# Patient Record
Sex: Male | Born: 1955 | Race: White | Hispanic: No | Smoking: Current every day smoker
Health system: Southern US, Community
[De-identification: ages and names within clinical notes are randomized; demographics above are authoritative.]

## PROBLEM LIST (undated history)

## (undated) DIAGNOSIS — J449 Chronic obstructive pulmonary disease, unspecified: Secondary | ICD-10-CM

## (undated) DIAGNOSIS — F419 Anxiety disorder, unspecified: Secondary | ICD-10-CM

## (undated) DIAGNOSIS — E782 Mixed hyperlipidemia: Secondary | ICD-10-CM

## (undated) DIAGNOSIS — I10 Essential (primary) hypertension: Secondary | ICD-10-CM

## (undated) DIAGNOSIS — E559 Vitamin D deficiency, unspecified: Secondary | ICD-10-CM

## (undated) DIAGNOSIS — K759 Inflammatory liver disease, unspecified: Secondary | ICD-10-CM

## (undated) DIAGNOSIS — C801 Malignant (primary) neoplasm, unspecified: Secondary | ICD-10-CM

## (undated) DIAGNOSIS — M1990 Unspecified osteoarthritis, unspecified site: Secondary | ICD-10-CM

## (undated) DIAGNOSIS — E785 Hyperlipidemia, unspecified: Secondary | ICD-10-CM

## (undated) DIAGNOSIS — F32A Depression, unspecified: Secondary | ICD-10-CM

## (undated) DIAGNOSIS — F329 Major depressive disorder, single episode, unspecified: Secondary | ICD-10-CM

## (undated) DIAGNOSIS — R7303 Prediabetes: Secondary | ICD-10-CM

## (undated) HISTORY — DX: Essential (primary) hypertension: I10

## (undated) HISTORY — DX: Prediabetes: R73.03

## (undated) HISTORY — DX: Vitamin D deficiency, unspecified: E55.9

## (undated) HISTORY — DX: Unspecified osteoarthritis, unspecified site: M19.90

## (undated) HISTORY — PX: HEMORRHOID SURGERY: SHX153

## (undated) HISTORY — DX: Inflammatory liver disease, unspecified: K75.9

## (undated) HISTORY — DX: Mixed hyperlipidemia: E78.2

## (undated) HISTORY — DX: Hyperlipidemia, unspecified: E78.5

---

## 1984-10-18 HISTORY — PX: ELBOW SURGERY: SHX618

## 1994-10-18 HISTORY — PX: HERNIA REPAIR: SHX51

## 2004-05-28 ENCOUNTER — Emergency Department (HOSPITAL_COMMUNITY): Admission: EM | Admit: 2004-05-28 | Discharge: 2004-05-29 | Payer: Self-pay | Admitting: Emergency Medicine

## 2005-10-18 HISTORY — PX: SHOULDER SURGERY: SHX246

## 2007-12-13 ENCOUNTER — Encounter: Admission: RE | Admit: 2007-12-13 | Discharge: 2007-12-13 | Payer: Self-pay | Admitting: Family Medicine

## 2009-04-07 ENCOUNTER — Ambulatory Visit (HOSPITAL_COMMUNITY): Admission: RE | Admit: 2009-04-07 | Discharge: 2009-04-07 | Payer: Self-pay | Admitting: Internal Medicine

## 2009-10-18 HISTORY — PX: POLYPECTOMY: SHX149

## 2009-10-18 HISTORY — PX: COLONOSCOPY: SHX174

## 2010-04-23 ENCOUNTER — Encounter (INDEPENDENT_AMBULATORY_CARE_PROVIDER_SITE_OTHER): Payer: Self-pay

## 2010-04-28 ENCOUNTER — Ambulatory Visit: Payer: Self-pay | Admitting: Gastroenterology

## 2010-06-03 ENCOUNTER — Ambulatory Visit: Payer: Self-pay | Admitting: Gastroenterology

## 2010-06-03 LAB — HM COLONOSCOPY

## 2010-06-05 ENCOUNTER — Encounter: Payer: Self-pay | Admitting: Gastroenterology

## 2010-11-18 NOTE — Letter (Signed)
Summary: Patient Notice- Polyp Results  [facility]  [address] [place], [place] [address]   Phone: [phone]  Fax: [phone]        June 05, 2010 MRN: [id number]    [facility] [address] [place], [place]  [address]    Dear Edward Cuevas,  I am pleased to inform you that the colon polyp(s) removed during your recent colonoscopy was (were) found to be benign (no cancer detected) upon pathologic examination.  I recommend you have a repeat colonoscopy examination in 10_ years to look for recurrent polyps, as having colon polyps increases your risk for having recurrent polyps or even colon cancer in the future.  Should you develop new or worsening symptoms of abdominal pain, bowel habit changes or bleeding from the rectum or bowels, please schedule an evaluation with either your primary care physician or with me.  Additional information/recommendations:  _x_ No further action with gastroenterology is needed at this time. Please      follow-up with your primary care physician for your other healthcare      needs.  __ Please call [phone] to schedule a return visit to review your      situation.  __ Please keep your follow-up visit as already scheduled.  __ Continue treatment plan as outlined the day of your exam.  Please call us if you are having persistent problems or have questions about your condition that have not been fully answered at this time.  Sincerely,  Mardella Layman MD [facility]  This letter has been electronically signed by your physician.  Appended Document: Patient Notice- Polyp Results letter mailed

## 2010-11-18 NOTE — Letter (Signed)
Summary: [facility] Instructions  [facility]  [address] [place], [place] [address]   Phone: [phone]  Fax: [phone]       Edward Cuevas    1956/06/30    MRN: [id number]        Procedure Day Dorna Bloom:  [facility]  06/03/10    Arrival Time: 10:00am     Procedure Time: 11:00am     Location of Procedure:                    _x _   Endoscopy Center (4th Floor)                        PREPARATION FOR COLONOSCOPY WITH MOVIPREP   Starting 5 days prior to your procedure FRIDAY 08/12  do not eat nuts, seeds, popcorn, corn, beans, peas,  salads, or any raw vegetables.  Do not take any fiber supplements (e.g. Metamucil, Citrucel, and Benefiber).  THE DAY BEFORE YOUR PROCEDURE         DATE: TUESDAY  08/16  1.  Drink clear liquids the entire day-NO SOLID FOOD  2.  Do not drink anything colored red or purple.  Avoid juices with pulp.  No orange juice.  3.  Drink at least 64 oz. (8 glasses) of fluid/clear liquids during the day to prevent dehydration and help the prep work efficiently.  CLEAR LIQUIDS INCLUDE: Water Jello Ice Popsicles Tea (sugar ok, no milk/cream) Powdered fruit flavored drinks Coffee (sugar ok, no milk/cream) Gatorade Juice: apple, white grape, white cranberry  Lemonade Clear bullion, consomm, broth Carbonated beverages (any kind) Strained chicken noodle soup Hard Candy                             4.  In the morning, mix first dose of MoviPrep solution:    Empty 1 Pouch A and 1 Pouch B into the disposable container    Add lukewarm drinking water to the top line of the container. Mix to dissolve    Refrigerate (mixed solution should be used within 24 hrs)  5.  Begin drinking the prep at 5:00 p.m. The MoviPrep container is divided by 4 marks.   Every 15 minutes drink the solution down to the next mark (approximately 8 oz) until the full liter is complete.   6.  Follow completed prep with 16 oz of clear liquid of your choice (Nothing  red or purple).  Continue to drink clear liquids until bedtime.  7.  Before going to bed, mix second dose of MoviPrep solution:    Empty 1 Pouch A and 1 Pouch B into the disposable container    Add lukewarm drinking water to the top line of the container. Mix to dissolve    Refrigerate  THE DAY OF YOUR PROCEDURE      DATE: [facility]  08/17  Beginning at 6:00 a.m. (5 hours before procedure):         1. Every 15 minutes, drink the solution down to the next mark (approx 8 oz) until the full liter is complete.  2. Follow completed prep with 16 oz. of clear liquid of your choice.    3. You may drink clear liquids until 9:00am  (2 HOURS BEFORE PROCEDURE).   MEDICATION INSTRUCTIONS  Unless otherwise instructed, you should take regular prescription medications with a small sip of water   as early as possible the morning of  your procedure.         OTHER INSTRUCTIONS  You will need a responsible adult at least 56 years of age to accompany you and drive you home.   This person must remain in the waiting room during your procedure.  Wear loose fitting clothing that is easily removed.  Leave jewelry and other valuables at home.  However, you may wish to bring a book to read or  an iPod/MP3 player to listen to music as you wait for your procedure to start.  Remove all body piercing jewelry and leave at home.  Total time from sign-in until discharge is approximately 2-3 hours.  You should go home directly after your procedure and rest.  You can resume normal activities the  day after your procedure.  The day of your procedure you should not:   Drive   Make legal decisions   Operate machinery   Drink alcohol   Return to work  You will receive specific instructions about eating, activities and medications before you leave.    The above instructions have been reviewed and explained to me by   Ulis Rias RN  April 28, 2010 4:23 PM     I fully understand and can  verbalize these instructions _____________________________ Date _________

## 2010-11-18 NOTE — Letter (Signed)
Summary: Patient Notice- Polyp ResultsPLEASE DELETE  [facility]  [address] [place], [place] [address]   Phone: [phone]  Fax: [phone]        June 05, 2010 MRN: [id number]    [facility] [address] [place], [place]  [address]    Dear Mr. Harig,  I am pleased to inform you that the colon polyp(s) removed during your recent colonoscopy was (were) found to be benign (no cancer detected) upon pathologic examination.  I recommend you have a repeat colonoscopy examination in 10_ years to look for recurrent polyps, as having colon polyps increases your risk for having recurrent polyps or even colon cancer in the future.  Should you develop new or worsening symptoms of abdominal pain, bowel habit changes or bleeding from the rectum or bowels, please schedule an evaluation with either your primary care physician or with me.  Additional information/recommendations:  _X_ No further action with gastroenterology is needed at this time. Please      follow-up with your primary care physician for your other healthcare      needs.  __ Please call [phone] to schedule a return visit to review your      situation.  __ Please keep your follow-up visit as already scheduled.  __ Continue treatment plan as outlined the day of your exam.  Please call us if you are having persistent problems or have questions about your condition that have not been fully answered at this time.  Sincerely,  Mardella Layman MD [facility]  This letter has been electronically signed by your physician.

## 2010-11-18 NOTE — Letter (Signed)
Summary: Patient Notice- Polyp ResultsPLEASE DELETE  [facility]  [address] [place], [place] [address]   Phone: [phone]  Fax: [phone]        June 05, 2010 MRN: [id number]    [facility] [address] [place], [place]  [address]    Dear Mr. Breon,  I am pleased to inform you that the colon polyp(s) removed during your recent colonoscopy was (were) found to be benign (no cancer detected) upon pathologic examination.  I recommend you have a repeat colonoscopy examination in 10_ years to look for recurrent polyps, as having colon polyps increases your risk for having recurrent polyps or even colon cancer in the future.  Should you develop new or worsening symptoms of abdominal pain, bowel habit changes or bleeding from the rectum or bowels, please schedule an evaluation with either your primary care physician or with me.  Additional information/recommendations:  __ No further action with gastroenterology is needed at this time. Please      follow-up with your primary care physician for your other healthcare      needs.  __ Please call [phone] to schedule a return visit to review your      situation.  __ Please keep your follow-up visit as already scheduled.  _x_ Continue treatment plan as outlined the day of your exam.  Please call us if you are having persistent problems or have questions about your condition that have not been fully answered at this time.  Sincerely,  Mardella Layman MD [facility]  This letter has been electronically signed by your physician.

## 2010-11-18 NOTE — Miscellaneous (Signed)
Summary: Lec previsit  Clinical Lists Changes  Medications: Added new medication of MOVIPREP 100 GM  SOLR (PEG-KCL-NACL-NASULF-NA ASC-C) As per prep instructions. - Signed Rx of MOVIPREP 100 GM  SOLR (PEG-KCL-NACL-NASULF-NA ASC-C) As per prep instructions.;  #1 x 0;  Signed;  Entered by: Ulis Rias RN;  Authorized by: Mardella Layman MD [facility];  Method used: [facility] [phone]*, [address] [place], [place], [place]  [address], Ph: [phone] or [phone], Fax: [phone] Allergies: Added new allergy or adverse reaction of PENICILLIN Observations: Added new observation of NKA: F (04/28/2010 15:32)    Prescriptions: MOVIPREP 100 GM  SOLR (PEG-KCL-NACL-NASULF-NA ASC-C) As per prep instructions.  #1 x 0   Entered by:   Ulis Rias RN   Authorized by:   Mardella Layman MD [facility]   Signed by:   Ulis Rias RN on 04/28/2010   Method used:   [facility] to        [facility] [phone]* (retail)       [address]       [place], [place]  [address]       Ph: [phone] or [phone]       Fax: [phone]   RxID:   [id number]

## 2010-11-18 NOTE — Procedures (Signed)
Summary: Colonoscopy  Patient: Edward Cuevas Note: All result statuses are Final unless otherwise noted.  Tests: (1) Colonoscopy (COL)   COL Colonoscopy           DONE     [facility]     [address]     [place], [place]  [address]           COLONOSCOPY PROCEDURE REPORT           PATIENT:  Edward Cuevas, Edward Cuevas  MR#:  [id number]     BIRTHDATE:  [birth year], 54 yrs. old  GENDER:  male     ENDOSCOPIST:  Vania Rea. Jarold Motto, MD, [facility]     REF. BY:  Lucky Cowboy, M.D.     PROCEDURE DATE:  06/03/2010     PROCEDURE:  Colonoscopy with snare polypectomy     ASA CLASS:  Class II     INDICATIONS:  Routine Risk Screening     MEDICATIONS:   Fentanyl 75 mcg IV, Benadryl 12.5 mg IV, Versed 7     mg IV           DESCRIPTION OF PROCEDURE:   After the risks benefits and     alternatives of the procedure were thoroughly explained, informed     consent was obtained.  Digital rectal exam was performed and     revealed no abnormalities.   The LB CF-[id number] endoscope     was introduced through the anus and advanced to the cecum, which     was identified by both the appendix and ileocecal valve, without     limitations.  The quality of the prep was adequate, using     MoviPrep.  The instrument was then slowly withdrawn as the colon     was fully examined.     <<PROCEDUREIMAGES>>           FINDINGS:  There were multiple polyps identified and removed. in     the rectum and sigmoid colon. FLAT 2-4MM POLYPS COLD SNARE     EXCISED.SEE PICTURES.  This was otherwise a normal examination of     the colon.  Internal hemorrhoids were found.   Retroflexed views     in the rectum revealed hypertrophied anal papillae.    The scope     was then withdrawn from the patient and the procedure completed.           COMPLICATIONS:  None     ENDOSCOPIC IMPRESSION:     1) Polyps, multiple in the rectum and sigmoid colon     2) Otherwise normal examination     3) Internal hemorrhoids     4) Hypertrophied anal  papillae     R/O ADENOMAS     RECOMMENDATIONS:     1) Repeat colonoscopy in 5 years if polyp adenomatous; otherwise     10 years     REPEAT EXAM:  No           ______________________________     Vania Rea. Jarold Motto, MD, [facility]           CC:           n.     eSIGNED:   Vania Rea. Patterson at 06/03/2010 12:10 PM           Jolinda Croak, [id number]  Note: An exclamation mark (!) indicates a result that was not dispersed into the flowsheet. Document Creation Date: 06/03/2010 12:12 PM _______________________________________________________________________  (1) Order result status: Final Collection  or observation date-time: 06/03/2010 12:04 Requested date-time:  Receipt date-time:  Reported date-time:  Referring Physician:   Ordering Physician: Sheryn Bison [phone]) Specimen Source:  Source: Launa Grill Order Number: [id number] Lab site:   Appended Document: Colonoscopy 10y f/u  Appended Document: Colonoscopy     Procedures Next Due Date:    Colonoscopy: 05/2020

## 2010-12-17 ENCOUNTER — Ambulatory Visit: Payer: Self-pay | Admitting: Gastroenterology

## 2011-07-09 ENCOUNTER — Other Ambulatory Visit: Payer: Self-pay | Admitting: Internal Medicine

## 2011-09-22 ENCOUNTER — Ambulatory Visit (HOSPITAL_COMMUNITY)
Admission: RE | Admit: 2011-09-22 | Discharge: 2011-09-22 | Disposition: A | Discharge status: Home / Self Care | Payer: BC Managed Care – PPO | Source: Ambulatory Visit | Attending: Internal Medicine | Admitting: Internal Medicine

## 2011-09-22 ENCOUNTER — Other Ambulatory Visit (HOSPITAL_COMMUNITY): Payer: Self-pay | Admitting: Internal Medicine

## 2011-09-22 DIAGNOSIS — F17200 Nicotine dependence, unspecified, uncomplicated: Secondary | ICD-10-CM | POA: Insufficient documentation

## 2011-09-22 DIAGNOSIS — I10 Essential (primary) hypertension: Secondary | ICD-10-CM | POA: Insufficient documentation

## 2011-09-22 DIAGNOSIS — Z0000 Encounter for general adult medical examination without abnormal findings: Secondary | ICD-10-CM | POA: Insufficient documentation

## 2013-09-03 ENCOUNTER — Encounter: Payer: Self-pay | Admitting: Internal Medicine

## 2013-09-03 NOTE — Telephone Encounter (Signed)
This encounter was created in error - please disregard.

## 2013-09-04 ENCOUNTER — Telehealth: Payer: Self-pay | Admitting: Internal Medicine

## 2013-09-04 NOTE — Telephone Encounter (Signed)
Can refill tyl # 4 an if needs hydrocodone can refer to pain clinic

## 2013-09-04 NOTE — Telephone Encounter (Signed)
Spoke with pt.  Per Dr. Oneta Rack, can not give RX for Hydrocodone but can refer to pain clinic . Pt can get refill for Tylenol #4 when due. Pt has no insurance and declined pain clinic appt

## 2013-09-04 NOTE — Telephone Encounter (Signed)
PT SAID HE LOST HIS JOB AND CAN NOT AFFORD THE TYLENOL 4.  WOULD LIKE TO REQUEST HYDROCODONE BECAUSE IT IS CHEAPER. PLEASE ADVISE PT. SENDING CHART AND NOTE BACK TO YOU

## 2013-09-19 ENCOUNTER — Other Ambulatory Visit: Payer: Self-pay | Admitting: Internal Medicine

## 2013-10-22 ENCOUNTER — Other Ambulatory Visit: Payer: Self-pay | Admitting: Internal Medicine

## 2013-10-22 DIAGNOSIS — E785 Hyperlipidemia, unspecified: Secondary | ICD-10-CM

## 2013-10-22 DIAGNOSIS — R7303 Prediabetes: Secondary | ICD-10-CM | POA: Insufficient documentation

## 2013-10-22 DIAGNOSIS — I10 Essential (primary) hypertension: Secondary | ICD-10-CM | POA: Insufficient documentation

## 2013-10-22 DIAGNOSIS — E782 Mixed hyperlipidemia: Secondary | ICD-10-CM | POA: Insufficient documentation

## 2013-10-22 DIAGNOSIS — E559 Vitamin D deficiency, unspecified: Secondary | ICD-10-CM | POA: Insufficient documentation

## 2013-10-22 MED ORDER — ACETAMINOPHEN-CODEINE #4 300-60 MG PO TABS: ORAL_TABLET | ORAL | Status: DC

## 2013-10-23 ENCOUNTER — Ambulatory Visit (INDEPENDENT_AMBULATORY_CARE_PROVIDER_SITE_OTHER): Payer: Self-pay | Admitting: Internal Medicine

## 2013-10-23 ENCOUNTER — Ambulatory Visit: Payer: Self-pay | Admitting: Internal Medicine

## 2013-10-23 ENCOUNTER — Encounter: Payer: Self-pay | Admitting: Internal Medicine

## 2013-10-23 VITALS — BP 102/60 | HR 68 | Temp 97.9°F | Resp 16 | Wt 179.0 lb

## 2013-10-23 DIAGNOSIS — R7309 Other abnormal glucose: Secondary | ICD-10-CM

## 2013-10-23 DIAGNOSIS — M51379 Other intervertebral disc degeneration, lumbosacral region without mention of lumbar back pain or lower extremity pain: Secondary | ICD-10-CM

## 2013-10-23 DIAGNOSIS — M159 Polyosteoarthritis, unspecified: Secondary | ICD-10-CM | POA: Insufficient documentation

## 2013-10-23 DIAGNOSIS — IMO0002 Reserved for concepts with insufficient information to code with codable children: Secondary | ICD-10-CM

## 2013-10-23 DIAGNOSIS — M5137 Other intervertebral disc degeneration, lumbosacral region: Secondary | ICD-10-CM | POA: Insufficient documentation

## 2013-10-23 DIAGNOSIS — E782 Mixed hyperlipidemia: Secondary | ICD-10-CM

## 2013-10-23 DIAGNOSIS — Z79899 Other long term (current) drug therapy: Secondary | ICD-10-CM

## 2013-10-23 DIAGNOSIS — E559 Vitamin D deficiency, unspecified: Secondary | ICD-10-CM

## 2013-10-23 HISTORY — DX: Other intervertebral disc degeneration, lumbosacral region: M51.37

## 2013-10-23 HISTORY — DX: Polyosteoarthritis, unspecified: M15.9

## 2013-10-23 HISTORY — DX: Other intervertebral disc degeneration, lumbosacral region without mention of lumbar back pain or lower extremity pain: M51.379

## 2013-10-23 NOTE — Progress Notes (Signed)
Patient ID: Edward Cuevas, male   DOB: 08-02-1956, 58 y.o.   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    HTN predates since 2003. Patient has lost weight, been out of work since last March and out of BP meds for 3-4 months Today's BP: 102/60 mmHg. Patient denies any cardiac type chest pain, palpitations, dyspnea/orthopnea/PND, dizziness, claudication, or dependent edema.   Patient is moderately limited by pains in his hands, shoulders, knees neck and back and was laid off from work last March and applied for disability and was approved for it to begin in March of this year. He does take Tyl # 3 up to 3-4 x day to maintain his activities.   Hyperlipidemia is controlled with diet & meds. Last Cholesterol was 158, Triglycerides were 49, HDL 64 and LDL 84 in March 2014 - at goal (while on treatment). Patient denies myalgias or other med SE's.    Also, the patient has history of PreDiabetes/insulin resistance since 03/2009 with last A1c of5.1% and elevated insulin 40 in July 2014. Patient denies any symptoms of reactive hypoglycemia, diabetic polys, paresthesias or visual blurring.      Further, Patient has history of Vitamin D Deficiency  (37 in 2010) with last vitamin D of 42 in July 2014. Patient supplements vitamin D without any suspected side-effects.  Current Outpatient Prescriptions on File Prior to Visit  Medication Sig Dispense Refill  . acetaminophen-codeine (TYLENOL #4) 300-60 MG per tablet 1 tablet 4 x day if needed for severe pain  120 tablet  3  . Cholecalciferol (VITAMIN D PO) Take by mouth daily. Takes 5000 units daily      . citalopram (CELEXA) 40 MG tablet Take 40 mg by mouth daily.      . clonazePAM (KLONOPIN) 2 MG tablet Take 2 mg by mouth. 1/2 TO 1 TABLET THREE TIMES DAILY       No current facility-administered medications on file prior to visit.     Allergies   Allergen Reactions  . Penicillins     REACTION: rash, itching  . Viagra [Sildenafil Citrate]     PMHx:   Past Medical History  Diagnosis Date  . Hyperlipidemia   . Hypertension   . Prediabetes   . Vitamin D deficiency   . Arthritis   . Hepatitis     A,B,C    FHx:    Reviewed / unchanged  SHx:    Reviewed / unchanged  Systems Review: Constitutional: Denies fever, chills, wt changes, headaches, insomnia, fatigue, night sweats, change in appetite. Eyes: Denies redness, blurred vision, diplopia, discharge, itchy, watery eyes.  ENT: Denies discharge, congestion, post nasal drip, epistaxis, sore throat, earache, hearing loss, dental pain, tinnitus, vertigo, sinus pain, snoring.  CV: Denies chest pain, palpitations, irregular heartbeat, syncope, dyspnea, diaphoresis, orthopnea, PND, claudication, edema. Respiratory: denies cough, dyspnea, DOE, pleurisy, hoarseness, laryngitis, wheezing.  Gastrointestinal: Denies dysphagia, odynophagia, heartburn, reflux, water brash, abdominal pain or cramps, nausea, vomiting, bloating, diarrhea, constipation, hematemesis, melena, hematochezia,  or hemorrhoids. Genitourinary: Denies dysuria, frequency, urgency, nocturia, hesitancy, discharge, hematuria, flank pain. Musculoskeletal: Denies arthralgias, myalgias, stiffness, jt. swelling, pain, limp, strain/sprain.  Skin: Denies pruritus, rash, hives, warts, acne, eczema, change in skin lesion(s). Neuro: No weakness, tremor, incoordination, spasms, paresthesia, or pain. Psychiatric: Denies confusion, memory loss, or sensory loss. Endo: Denies change in weight, skin, hair change.  Heme/Lymph: No excessive bleeding, bruising, orenlarged lymph  nodes.  Filed Vitals:   10/23/13 1448  BP: 102/60  Pulse: 68  Temp: 97.9 F (36.6 C)  Resp: 16    There is no height on file to calculate BMI.  On Exam: Appears well nourished - in no distress. Eyes: PERRLA, EOMs, conjunctiva no swelling or  erythema. Sinuses: No frontal/maxillary tenderness ENT/Mouth: EAC's clear, TM's nl w/o erythema, bulging. Nares clear w/o erythema, swelling, exudates. Oropharynx clear without erythema or exudates. Oral hygiene is good. Tongue normal, non obstructing. Hearing intact.  Neck: Supple. Thyroid nl. Car 2+/2+ without bruits, nodes or JVD. Chest: Respirations nl with BS clear & equal w/o rales, rhonchi, wheezing or stridor.  Cor: Heart sounds normal w/ regular rate and rhythm without sig. murmurs, gallops, clicks, or rubs. Peripheral pulses normal and equal  without edema.  Abdomen: Soft & bowel sounds normal. Non-tender w/o guarding, rebound, hernias, masses, or organomegaly.  Lymphatics: Unremarkable.  Musculoskeletal: Full ROM all peripheral extremities, joint stability, 5/5 strength, and normal gait.  Skin: Warm, dry without exposed rashes, lesions, ecchymosis apparent.  Neuro: Cranial nerves intact, reflexes equal bilaterally. Sensory-motor testing grossly intact. Tendon reflexes grossly intact.  Pysch: Alert & oriented x 3. Insight and judgement nl & appropriate. No ideations.  Assessment and Plan:  1. Hypertension - Continue monitor blood pressure at home. Continue diet/meds same.  2. Hyperlipidemia - Continue diet/meds, exercise,& lifestyle modifications.   3. Pre-diabetes/Insulin Resistance - Continue diet, exercise, lifestyle modifications. Monitor appropriate labs when pt's new insurance takes effect  4. Vitamin D Deficiency - Continue supplementation.  5. DJD / DDD   Recommended regular exercise, BP monitoring, weight control, and discussed med and SE's. Recommended labs to assess and monitor clinical status. Further disposition pending results of labs.

## 2013-10-24 ENCOUNTER — Ambulatory Visit: Payer: Self-pay | Admitting: Internal Medicine

## 2013-11-28 ENCOUNTER — Other Ambulatory Visit: Payer: Self-pay | Admitting: Internal Medicine

## 2014-01-04 ENCOUNTER — Other Ambulatory Visit: Payer: Self-pay | Admitting: Internal Medicine

## 2014-01-05 ENCOUNTER — Other Ambulatory Visit: Payer: Self-pay | Admitting: Internal Medicine

## 2014-01-22 ENCOUNTER — Other Ambulatory Visit: Payer: Self-pay | Admitting: Internal Medicine

## 2014-01-22 MED ORDER — CITALOPRAM HYDROBROMIDE 40 MG PO TABS: 40.0000 mg | ORAL_TABLET | Freq: Every day | ORAL | Status: DC

## 2014-01-23 ENCOUNTER — Other Ambulatory Visit: Payer: Self-pay | Admitting: Internal Medicine

## 2014-01-24 ENCOUNTER — Other Ambulatory Visit: Payer: Self-pay | Admitting: Emergency Medicine

## 2014-01-24 MED ORDER — ACETAMINOPHEN-CODEINE #4 300-60 MG PO TABS: ORAL_TABLET | ORAL | Status: DC

## 2014-01-25 ENCOUNTER — Other Ambulatory Visit: Payer: Self-pay | Admitting: Emergency Medicine

## 2014-01-25 MED ORDER — CITALOPRAM HYDROBROMIDE 40 MG PO TABS: 40.0000 mg | ORAL_TABLET | Freq: Every day | ORAL | Status: DC

## 2014-02-03 ENCOUNTER — Other Ambulatory Visit: Payer: Self-pay | Admitting: Internal Medicine

## 2014-03-05 ENCOUNTER — Other Ambulatory Visit: Payer: Self-pay | Admitting: Emergency Medicine

## 2014-03-08 ENCOUNTER — Other Ambulatory Visit: Payer: Self-pay | Admitting: Internal Medicine

## 2014-04-08 ENCOUNTER — Ambulatory Visit (INDEPENDENT_AMBULATORY_CARE_PROVIDER_SITE_OTHER): Payer: 59 | Admitting: Internal Medicine

## 2014-04-08 ENCOUNTER — Encounter: Payer: Self-pay | Admitting: Internal Medicine

## 2014-04-08 VITALS — BP 142/84 | HR 64 | Temp 98.6°F | Resp 16 | Ht 71.0 in | Wt 176.6 lb

## 2014-04-08 DIAGNOSIS — R7303 Prediabetes: Secondary | ICD-10-CM

## 2014-04-08 DIAGNOSIS — F3289 Other specified depressive episodes: Secondary | ICD-10-CM

## 2014-04-08 DIAGNOSIS — G894 Chronic pain syndrome: Secondary | ICD-10-CM

## 2014-04-08 DIAGNOSIS — E785 Hyperlipidemia, unspecified: Secondary | ICD-10-CM

## 2014-04-08 DIAGNOSIS — E559 Vitamin D deficiency, unspecified: Secondary | ICD-10-CM

## 2014-04-08 DIAGNOSIS — F32A Depression, unspecified: Secondary | ICD-10-CM

## 2014-04-08 DIAGNOSIS — Z79899 Other long term (current) drug therapy: Secondary | ICD-10-CM | POA: Insufficient documentation

## 2014-04-08 DIAGNOSIS — F329 Major depressive disorder, single episode, unspecified: Secondary | ICD-10-CM

## 2014-04-08 DIAGNOSIS — I10 Essential (primary) hypertension: Secondary | ICD-10-CM

## 2014-04-08 DIAGNOSIS — R7309 Other abnormal glucose: Secondary | ICD-10-CM

## 2014-04-08 LAB — CBC WITH DIFFERENTIAL/PLATELET
BASOS ABS: 0.1 10*3/uL (ref 0.0–0.1)
BASOS PCT: 1 % (ref 0–1)
Eosinophils Absolute: 0 10*3/uL (ref 0.0–0.7)
Eosinophils Relative: 0 % (ref 0–5)
HEMATOCRIT: 45.5 % (ref 39.0–52.0)
Hemoglobin: 16.3 g/dL (ref 13.0–17.0)
Lymphocytes Relative: 30 % (ref 12–46)
Lymphs Abs: 1.9 10*3/uL (ref 0.7–4.0)
MCH: 32.8 pg (ref 26.0–34.0)
MCHC: 35.8 g/dL (ref 30.0–36.0)
MCV: 91.5 fL (ref 78.0–100.0)
MONO ABS: 0.4 10*3/uL (ref 0.1–1.0)
Monocytes Relative: 7 % (ref 3–12)
NEUTROS ABS: 4 10*3/uL (ref 1.7–7.7)
Neutrophils Relative %: 62 % (ref 43–77)
PLATELETS: 194 10*3/uL (ref 150–400)
RBC: 4.97 MIL/uL (ref 4.22–5.81)
RDW: 14.5 % (ref 11.5–15.5)
WBC: 6.4 10*3/uL (ref 4.0–10.5)

## 2014-04-08 LAB — HEMOGLOBIN A1C
Hgb A1c MFr Bld: 5.7 % — ABNORMAL HIGH (ref <5.7)
MEAN PLASMA GLUCOSE: 117 mg/dL — AB (ref <117)

## 2014-04-08 MED ORDER — BUPROPION HCL ER (XL) 300 MG PO TB24: 300.0000 mg | ORAL_TABLET | Freq: Every day | ORAL | Status: DC

## 2014-04-08 MED ORDER — ACETAMINOPHEN-CODEINE #4 300-60 MG PO TABS: ORAL_TABLET | ORAL | Status: DC

## 2014-04-08 NOTE — Progress Notes (Signed)
Patient ID: Edward Cuevas, male   DOB: 1956-07-28, 58 y.o.   MRN: [id number]    This very nice 58 y.o.DWM presents for  delayed 3 month follow up (2 months late)  with Hypertension, Hyperlipidemia, Pre-Diabetes and Vitamin D Deficiency.    HTN predates since 2003. BP has been controlled at home. Today's BP: 142/84 mmHg. Patient denies any cardiac type chest pain, palpitations, dyspnea/orthopnea/PND, dizziness, claudication, or dependent edema.   Hyperlipidemia is controlled with diet. Last Cholesterol was 158, Triglycerides were 49, HDL 68 and LDL 84 - at goal in July 2014. Patient denies myalgias or other med SE's.    Patient continues to c/o pains in all extremity joints limiting his ability to function and in fact was awarded SS disability. He relates having become depressed and reclusive - not leaving his home for long periods. He denies any suicidal ideations.   Also, the patient has history of PreDiabetes/insulin resistance since 2011 with A1c 5.9% and last A1c was 5.1% with elevated insulin in Aug 2014. Patient denies any symptoms of reactive hypoglycemia, diabetic polys, paresthesias or visual blurring.   Further, Patient has history of Vitamin D Deficiency with last vitamin D of   . Patient supplements vitamin D without any suspected side-effects.   Medication List   acetaminophen-codeine 300-60 MG per tablet  Commonly known as:  TYLENOL #4  Take 1/2 to 1 tablet 3 to 4 x day if needed for pain     buPROPion 300 MG 24 hr tablet  Commonly known as:  WELLBUTRIN XL Start taking on:  06/08/2014  Take 1 tablet (300 mg total) by mouth daily. For depression     citalopram 40 MG tablet  Commonly known as:  CELEXA  TAKE 1 TABLET EVERY DAY     clonazePAM 2 MG tablet  Commonly known as:  KLONOPIN  TAKE 1/2 TO 1 TAB 3 TIMES DAILY     traMADol 50 MG tablet  Commonly known as:  ULTRAM  TAKE 1 TABLET BY MOUTH 3-4 TIMES A DAY AS NEEDED FOR PAIN     VITAMIN D PO  Take by mouth daily. Takes 5000  units daily       Allergies  Allergen Reactions  . Penicillins     REACTION: rash, itching  . Viagra [Sildenafil Citrate]    PMHx:   Past Medical History  Diagnosis Date  . Hyperlipidemia   . Hypertension   . Prediabetes   . Vitamin D deficiency   . Arthritis   . Hepatitis     A,B,C   FHx:    Reviewed / unchanged  SHx:    Reviewed / unchanged  Patient is on SS Disability for his DJD.  Systems Review: Constitutional: Denies fever, chills, wt changes, headaches, insomnia, fatigue, night sweats, change in appetite. Eyes: Denies redness, blurred vision, diplopia, discharge, itchy, watery eyes.  ENT: Denies discharge, congestion, post nasal drip, epistaxis, sore throat, earache, hearing loss, dental pain, tinnitus, vertigo, sinus pain, snoring.  CV: Denies chest pain, palpitations, irregular heartbeat, syncope, dyspnea, diaphoresis, orthopnea, PND, claudication or edema. Respiratory: denies cough, dyspnea, DOE, pleurisy, hoarseness, laryngitis, wheezing.  Gastrointestinal: Denies dysphagia, odynophagia, heartburn, reflux, water brash, abdominal pain or cramps, nausea, vomiting, bloating, diarrhea, constipation, hematemesis, melena, hematochezia  or hemorrhoids. Genitourinary: Denies dysuria, frequency, urgency, nocturia, hesitancy, discharge, hematuria or flank pain. Musculoskeletal: Denies arthralgias, myalgias, stiffness, jt. swelling, pain, limping or strain/sprain.  Skin: Denies pruritus, rash, hives, warts, acne, eczema or change in skin lesion(s). Neuro: No weakness,  tremor, incoordination, spasms, paresthesia or pain. Psychiatric: Denies confusion, memory loss or sensory loss. Endo: Denies change in weight, skin or hair change.  Heme/Lymph: No excessive bleeding, bruising or enlarged lymph nodes.  Exam:  BP 142/84  Pulse 64  Temp 98.6 F   Resp 16  Ht 5\' 11"    Wt 176 lb 9.6 oz   BMI 24.64 kg/m2  Appears well nourished - in no distress. Eyes: PERRLA, EOMs,  conjunctiva no swelling or erythema. Sinuses: No frontal/maxillary tenderness ENT/Mouth: EAC's clear, TM's nl w/o erythema, bulging. Nares clear w/o erythema, swelling, exudates. Oropharynx clear without erythema or exudates. Oral hygiene is good. Tongue normal, non obstructing. Hearing intact.  Neck: Supple. Thyroid nl. Car 2+/2+ without bruits, nodes or JVD. Chest: Respirations nl with BS clear & equal w/o rales, rhonchi, wheezing or stridor.  Cor: Heart sounds normal w/ regular rate and rhythm without sig. murmurs, gallops, clicks, or rubs. Peripheral pulses normal and equal  without edema.  Abdomen: Soft & bowel sounds normal. Non-tender w/o guarding, rebound, hernias, masses, or organomegaly.  Lymphatics: Unremarkable.  Musculoskeletal: Full ROM all peripheral extremities, joint stability, 5/5 strength, and normal gait.  Skin: Warm, dry without exposed rashes, lesions or ecchymosis apparent.  Neuro: Cranial nerves intact, reflexes equal bilaterally. Sensory-motor testing grossly intact. Tendon reflexes grossly intact.  Pysch: Alert & oriented x 3. Insight and judgement nl & appropriate. No ideations.  Assessment and Plan:  1. Hypertension - Continue monitor blood pressure at home. Continue diet/meds same.  2. Hyperlipidemia - Continue diet/meds, exercise,& lifestyle modifications. Continue monitor periodic cholesterol/liver & renal functions   3. Pre-diabetes/Insulin Resistance - Continue diet, exercise, lifestyle modifications. Monitor appropriate labs.  4. Vitamin D Deficiency - Continue supplementation.  5. DJD - continue Tyl #4  - lowest dose possible   6. Depression - add Bupropion for his depression & ROV 6 weeks  Recommended regular exercise, BP monitoring, weight control, and discussed med and SE's. Recommended labs to assess and monitor clinical status. Further disposition pending results of labs.

## 2014-04-08 NOTE — Patient Instructions (Signed)
Continue Citalopram same for depression  +++++++++++++++++++++++++++++++++++++++++++++++++++++++++++  Start WELLBUTRIN 300 mg XL  (Bupropion) at 1/2 tablet each morning for 2 weeks, then  increase to 1 whole tablet daily for depression  Depression, Adult Depression refers to feeling sad, low, down in the dumps, blue, gloomy, or empty. In general, there are two kinds of depression: 1. Depression that we all experience from time to time because of upsetting life experiences, including the loss of a job or the ending of a relationship (normal sadness or normal grief). This kind of depression is considered normal, is short lived, and resolves within a few days to 2 weeks. (Depression experienced after the loss of a loved one is called bereavement. Bereavement often lasts longer than 2 weeks but normally gets better with time.) 2. Clinical depression, which lasts longer than normal sadness or normal grief or interferes with your ability to function at home, at work, and in school. It also interferes with your personal relationships. It affects almost every aspect of your life. Clinical depression is an illness. Symptoms of depression also can be caused by conditions other than normal sadness and grief or clinical depression. Examples of these conditions are listed as follows:  Physical illness--Some physical illnesses, including underactive thyroid gland (hypothyroidism), severe anemia, specific types of cancer, diabetes, uncontrolled seizures, heart and lung problems, strokes, and chronic pain are commonly associated with symptoms of depression.  Side effects of some prescription medicine--In some people, certain types of prescription medicine can cause symptoms of depression.  Substance abuse--Abuse of alcohol and illicit drugs can cause symptoms of depression. SYMPTOMS Symptoms of normal sadness and normal grief include the following:  Feeling sad or crying for short periods of time.  Not  caring about anything (apathy).  Difficulty sleeping or sleeping too much.  No longer able to enjoy the things you used to enjoy.  Desire to be by oneself all the time (social isolation).  Lack of energy or motivation.  Difficulty concentrating or remembering.  Change in appetite or weight.  Restlessness or agitation. Symptoms of clinical depression include the same symptoms of normal sadness or normal grief and also the following symptoms:  Feeling sad or crying all the time.  Feelings of guilt or worthlessness.  Feelings of hopelessness or helplessness.  Thoughts of suicide or the desire to harm yourself (suicidal ideation).  Loss of touch with reality (psychotic symptoms). Seeing or hearing things that are not real (hallucinations) or having false beliefs about your life or the people around you (delusions and paranoia). DIAGNOSIS  The diagnosis of clinical depression usually is based on the severity and duration of the symptoms. Your caregiver also will ask you questions about your medical history and substance use to find out if physical illness, use of prescription medicine, or substance abuse is causing your depression. Your caregiver also may order blood tests. TREATMENT  Typically, normal sadness and normal grief do not require treatment. However, sometimes antidepressant medicine is prescribed for bereavement to ease the depressive symptoms until they resolve. The treatment for clinical depression depends on the severity of your symptoms but typically includes antidepressant medicine, counseling with a mental health professional, or a combination of both. Your caregiver will help to determine what treatment is best for you. Depression caused by physical illness usually goes away with appropriate medical treatment of the illness. If prescription medicine is causing depression, talk with your caregiver about stopping the medicine, decreasing the dose, or substituting another  medicine. Depression caused by abuse of  alcohol or illicit drugs abuse goes away with abstinence from these substances. Some adults need professional help in order to stop drinking or using drugs. SEEK IMMEDIATE CARE IF:  You have thoughts about hurting yourself or others.  You lose touch with reality (have psychotic symptoms).  You are taking medicine for depression and have a serious side effect. FOR MORE INFORMATION National Alliance on Mental Illness: www.nami.[employer]: https://carter.com/ Document Released: 10/01/2000 Document Revised: 04/04/2012 Document Reviewed: 01/03/2012 [facility] Patient Information 2015 [place], [place]. This information is not intended to replace advice given to you by your health care provider. Make sure you discuss any questions you have with your health care provider.

## 2014-04-09 LAB — BASIC METABOLIC PANEL WITH GFR
BUN: 9 mg/dL (ref 6–23)
CO2: 24 mEq/L (ref 19–32)
CREATININE: 0.89 mg/dL (ref 0.50–1.35)
Calcium: 9 mg/dL (ref 8.4–10.5)
Chloride: 100 mEq/L (ref 96–112)
Glucose, Bld: 97 mg/dL (ref 70–99)
POTASSIUM: 4.4 meq/L (ref 3.5–5.3)
Sodium: 137 mEq/L (ref 135–145)

## 2014-04-09 LAB — LIPID PANEL
CHOLESTEROL: 152 mg/dL (ref 0–200)
HDL: 62 mg/dL (ref >39)
LDL Cholesterol: 81 mg/dL (ref 0–99)
TRIGLYCERIDES: 45 mg/dL (ref <150)
Total CHOL/HDL Ratio: 2.5 Ratio
VLDL: 9 mg/dL (ref 0–40)

## 2014-04-09 LAB — INSULIN, FASTING: INSULIN FASTING, SERUM: 12 u[IU]/mL (ref 3–28)

## 2014-04-09 LAB — HEPATIC FUNCTION PANEL
ALBUMIN: 4.4 g/dL (ref 3.5–5.2)
ALT: 15 U/L (ref 0–53)
AST: 27 U/L (ref 0–37)
Alkaline Phosphatase: 42 U/L (ref 39–117)
BILIRUBIN INDIRECT: 0.6 mg/dL (ref 0.2–1.2)
BILIRUBIN TOTAL: 0.8 mg/dL (ref 0.2–1.2)
Bilirubin, Direct: 0.2 mg/dL (ref 0.0–0.3)
TOTAL PROTEIN: 6.8 g/dL (ref 6.0–8.3)

## 2014-04-09 LAB — VITAMIN D 25 HYDROXY (VIT D DEFICIENCY, FRACTURES): Vit D, 25-Hydroxy: 24 ng/mL — ABNORMAL LOW (ref 30–89)

## 2014-04-09 LAB — MAGNESIUM: Magnesium: 1.6 mg/dL (ref 1.5–2.5)

## 2014-04-09 LAB — TSH: TSH: 1.53 u[IU]/mL (ref 0.350–4.500)

## 2014-04-24 ENCOUNTER — Encounter: Payer: Self-pay | Admitting: Internal Medicine

## 2014-05-04 ENCOUNTER — Other Ambulatory Visit: Payer: Self-pay | Admitting: Physician Assistant

## 2014-05-05 ENCOUNTER — Other Ambulatory Visit: Payer: Self-pay | Admitting: Internal Medicine

## 2014-05-31 ENCOUNTER — Other Ambulatory Visit: Payer: Self-pay | Admitting: Emergency Medicine

## 2014-07-05 ENCOUNTER — Encounter: Payer: Self-pay | Admitting: Internal Medicine

## 2014-07-19 ENCOUNTER — Other Ambulatory Visit: Payer: Self-pay | Admitting: Physician Assistant

## 2014-08-05 ENCOUNTER — Other Ambulatory Visit: Payer: Self-pay | Admitting: Internal Medicine

## 2014-08-05 ENCOUNTER — Other Ambulatory Visit: Payer: Self-pay | Admitting: Physician Assistant

## 2014-08-26 ENCOUNTER — Other Ambulatory Visit: Payer: Self-pay | Admitting: Physician Assistant

## 2014-09-17 ENCOUNTER — Other Ambulatory Visit: Payer: Self-pay | Admitting: Physician Assistant

## 2014-09-19 ENCOUNTER — Ambulatory Visit (INDEPENDENT_AMBULATORY_CARE_PROVIDER_SITE_OTHER): Payer: BC Managed Care – PPO | Admitting: Internal Medicine

## 2014-09-19 ENCOUNTER — Encounter: Payer: Self-pay | Admitting: Internal Medicine

## 2014-09-19 VITALS — BP 104/72 | HR 88 | Temp 99.9°F | Resp 16 | Ht 70.5 in | Wt 150.6 lb

## 2014-09-19 DIAGNOSIS — Z113 Encounter for screening for infections with a predominantly sexual mode of transmission: Secondary | ICD-10-CM

## 2014-09-19 DIAGNOSIS — R7303 Prediabetes: Secondary | ICD-10-CM

## 2014-09-19 DIAGNOSIS — R945 Abnormal results of liver function studies: Secondary | ICD-10-CM

## 2014-09-19 DIAGNOSIS — I10 Essential (primary) hypertension: Secondary | ICD-10-CM

## 2014-09-19 DIAGNOSIS — R7309 Other abnormal glucose: Secondary | ICD-10-CM

## 2014-09-19 DIAGNOSIS — R6889 Other general symptoms and signs: Secondary | ICD-10-CM

## 2014-09-19 DIAGNOSIS — E785 Hyperlipidemia, unspecified: Secondary | ICD-10-CM

## 2014-09-19 DIAGNOSIS — R7989 Other specified abnormal findings of blood chemistry: Secondary | ICD-10-CM

## 2014-09-19 DIAGNOSIS — Z1212 Encounter for screening for malignant neoplasm of rectum: Secondary | ICD-10-CM

## 2014-09-19 DIAGNOSIS — M5137 Other intervertebral disc degeneration, lumbosacral region: Secondary | ICD-10-CM

## 2014-09-19 DIAGNOSIS — E559 Vitamin D deficiency, unspecified: Secondary | ICD-10-CM

## 2014-09-19 DIAGNOSIS — Z79899 Other long term (current) drug therapy: Secondary | ICD-10-CM

## 2014-09-19 DIAGNOSIS — Z125 Encounter for screening for malignant neoplasm of prostate: Secondary | ICD-10-CM

## 2014-09-19 DIAGNOSIS — Z0001 Encounter for general adult medical examination with abnormal findings: Secondary | ICD-10-CM

## 2014-09-19 MED ORDER — ACETAMINOPHEN-CODEINE #4 300-60 MG PO TABS: ORAL_TABLET | ORAL | Status: DC

## 2014-09-19 NOTE — Progress Notes (Signed)
Patient ID: Edward Cuevas, male   DOB: 1956/05/02, 58 y.o.   MRN: [id number]  Annual Screening Comprehensive Examination  This very nice 58 y.o.DWM presents for complete physical.  Patient has been followed for HTN,   Prediabetes, Hyperlipidemia, and Vitamin D Deficiency. Pt on SS Disability since Mar 2014 for DJD and c/o pains in hands, shoulders, neck and knees with control by 2-3 Tyl #4 /day.  Patient was treated a 2sd time for Hepatitis C -alleged to cure. Patient relates ~ 30# weight loss over the last 1-2 years.   HTN predates since 2003. Patient's BP has been controlled at home.Today's BP: 104/72 mmHg. Patient denies any cardiac symptoms as chest pain, palpitations, shortness of breath, dizziness or ankle swelling.   Patient's hyperlipidemia is controlled with diet and medications. Patient denies myalgias or other medication SE's. Last lipids were at goal- Total  Chol 152; HDL  62; LDL  81; Trig 45 on  04/08/2014.   Patient has prediabetes since June 2011 with  A1c 5.9% and A1c 5.1% and elevated insulin 48 in June 2010.  Patient denies reactive hypoglycemic symptoms, visual blurring, diabetic polys or paresthesias. Last A1c was 5.7% on  04/08/2014.   Finally, patient has history of Vitamin D Deficiency of 37 in 2010 and last vitamin D was  24 on  04/08/2014.  Medication Sig  . Cholecalciferol (VITAMIN D PO) Take by mouth daily. Takes 5000 units daily  . clonazePAM (KLONOPIN) 2 MG tablet TAKE ONE-HALF TO ONE TABLET BY MOUTH THREE TIMES DAILY   Allergies  Allergen Reactions  . Penicillins     REACTION: rash, itching  . Viagra [Sildenafil Citrate]    Past Medical History  Diagnosis Date  . Hyperlipidemia   . Hypertension   . Prediabetes   . Vitamin D deficiency   . Arthritis   . Hepatitis     A,B,C   Health Maintenance  Topic Date Due  . INFLUENZA VACCINE  05/18/2014  . COLONOSCOPY  06/03/2020  . TETANUS/TDAP  04/11/2021   Immunization History  Administered Date(s) Administered   . Pneumococcal Polysaccharide-23 04/07/2009  . Tdap 04/12/2011   Past Surgical History  Procedure Laterality Date  . Elbow surgery Right 1986  . Hernia repair  1996  . Shoulder surgery Left 2007  . Hemorrhoid surgery  1977 and 1980   History   Social History  . Marital Status: Divorced x 9 yrs (2006).     Spouse Name: N/A    Number of Children: 1 son in college  . Years of Education: N/A   Occupational History  .  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   Social History Main Topics  . Smoking status: Current Every Day Smoker -- 1.00 packs/day    Types: Cigarettes  . Smokeless tobacco: Never Used  . Alcohol Use: 7.2 oz/week    12 Not specified per week     Comment: Beer on occasion  . Drug Use: No  . Sexual Activity: Not on file    ROS Constitutional: Denies fever, chills, weight loss/gain, headaches, insomnia, fatigue, night sweats or change in appetite. Eyes: Denies redness, blurred vision, diplopia, discharge, itchy or watery eyes.  ENT: Denies discharge, congestion, post nasal drip, epistaxis, sore throat, earache, hearing loss, dental pain, Tinnitus, Vertigo, Sinus pain or snoring.  Cardio: Denies chest pain, palpitations, irregular heartbeat, syncope, dyspnea, diaphoresis, orthopnea, PND, claudication or edema Respiratory: denies cough, dyspnea, DOE, pleurisy, hoarseness,  laryngitis or wheezing.  Gastrointestinal: Denies dysphagia, heartburn, reflux, water brash, pain, cramps, nausea, vomiting, bloating, diarrhea, constipation, hematemesis, melena, hematochezia, jaundice or hemorrhoids Genitourinary: Denies dysuria, frequency, urgency, nocturia, hesitancy, discharge, hematuria or flank pain Musculoskeletal: Denies arthralgia, myalgia, stiffness, Jt. Swelling, pain, limp or strain/sprain. Denies Falls. Skin: Denies puritis, rash, hives, warts, acne, eczema or change in skin lesion Neuro: No weakness,  tremor, incoordination, spasms, paresthesia or pain Psychiatric: Denies confusion, memory loss or sensory loss. Denies Depression. Endocrine: Denies change in weight, skin, hair change, nocturia, and paresthesia, diabetic polys, visual blurring or hyper / hypo glycemic episodes.  Heme/Lymph: No excessive bleeding, bruising or enlarged lymph nodes.  Physical Exam  BP 104/72 mmHg  Pulse 88  Temp(Src) 99.9 F (37.7 C)  Resp 16  Ht 5' 10.5" (1.791 m)  Wt 150 lb 9.6 oz (68.312 kg)  BMI 21.30 kg/m2  General Appearance: Well nourished, in no apparent distress. Eyes: PERRLA, EOMs, conjunctiva no swelling or erythema, normal fundi and vessels. Sinuses: No frontal/maxillary tenderness ENT/Mouth: EACs patent / TMs  nl. Nares clear without erythema, swelling, mucoid exudates. Oral hygiene is good. No erythema, swelling, or exudate. Tongue normal, non-obstructing. Tonsils not swollen or erythematous. Hearing normal.  Neck: Supple, thyroid normal. No bruits, nodes or JVD. Respiratory: Respiratory effort normal.  BS equal and clear bilateral without rales, rhonci, wheezing or stridor. Cardio: Heart sounds are normal with regular rate and rhythm and no murmurs, rubs or gallops. Peripheral pulses are normal and equal bilaterally without edema. No aortic or femoral bruits. Chest: symmetric with normal excursions and percussion.  Abdomen: Flat, soft, with bowl sounds. Nontender, no guarding, rebound, hernias, masses, or organomegaly.  Lymphatics: Non tender without lymphadenopathy.  Genitourinary: No hernias.Testes nl. DRE - prostate nl for age - smooth & firm w/o nodules. Musculoskeletal: Full ROM all peripheral extremities, joint stability, 5/5 strength, and normal gait. Skin: Multiple crusted & scaly areas of trunk/back and extremities.  Neuro: Cranial nerves intact, reflexes equal bilaterally. Normal muscle tone, no cerebellar symptoms. Sensation intact.  Pysch: Awake and oriented X 3with normal  affect, insight and judgment appropriate.   Assessment and Plan  1. Encounter for general adult medical examination with abnormal findings  - Urine Microscopic - EKG 12-Lead - Vitamin B12 - Testosterone - Iron and TIBC - CBC with Differential - BASIC METABOLIC PANEL WITH GFR - Hepatic function panel - Magnesium  2. Essential hypertension  - Microalbumin / creatinine urine ratio - [place], RETROPERITNL ABD,  LTD  3. Hyperlipidemia  - Lipid panel - TSH  4. Prediabetes  - Hemoglobin A1c - Insulin, fasting  5. Vitamin D deficiency  - Vit D  25 hydroxy (rtn osteoporosis monitoring)  6. DDD (degenerative disc disease), lumbosacral   7. Screening for rectal cancer  - POC Hemoccult Bld/Stl (3-Cd Home Screen); Future  8. Screening for prostate cancer  - PSA  9. Screening for venereal disease (VD)  - HIV antibody - RPR  10. Abnormal LFTs  - Hepatitis A antibody, total - Hepatitis B core antibody, total - Hepatitis B e antibody - Hepatitis B surface antibody - Hepatitis C antibody   Continue prudent diet as discussed, weight control, BP monitoring, regular exercise, and medications as discussed.  Discussed med effects and SE's. Routine screening labs and tests as requested with regular follow-up as recommended.

## 2014-09-19 NOTE — Patient Instructions (Signed)

## 2014-09-20 LAB — CBC WITH DIFFERENTIAL/PLATELET
BASOS ABS: 0.1 10*3/uL (ref 0.0–0.1)
BASOS PCT: 1 % (ref 0–1)
Eosinophils Absolute: 0 10*3/uL (ref 0.0–0.7)
Eosinophils Relative: 0 % (ref 0–5)
HEMATOCRIT: 45.6 % (ref 39.0–52.0)
Hemoglobin: 16.1 g/dL (ref 13.0–17.0)
LYMPHS PCT: 32 % (ref 12–46)
Lymphs Abs: 2 10*3/uL (ref 0.7–4.0)
MCH: 31.8 pg (ref 26.0–34.0)
MCHC: 35.3 g/dL (ref 30.0–36.0)
MCV: 89.9 fL (ref 78.0–100.0)
MPV: 10.2 fL (ref 9.4–12.4)
Monocytes Absolute: 0.4 10*3/uL (ref 0.1–1.0)
Monocytes Relative: 7 % (ref 3–12)
NEUTROS ABS: 3.7 10*3/uL (ref 1.7–7.7)
NEUTROS PCT: 60 % (ref 43–77)
Platelets: 155 10*3/uL (ref 150–400)
RBC: 5.07 MIL/uL (ref 4.22–5.81)
RDW: 15 % (ref 11.5–15.5)
WBC: 6.1 10*3/uL (ref 4.0–10.5)

## 2014-09-20 LAB — MICROALBUMIN / CREATININE URINE RATIO
Creatinine, Urine: 234.9 mg/dL
MICROALB/CREAT RATIO: 47.7 mg/g — AB (ref 0.0–30.0)
Microalb, Ur: 11.2 mg/dL — ABNORMAL HIGH (ref <2.0)

## 2014-09-20 LAB — BASIC METABOLIC PANEL WITH GFR
BUN: 8 mg/dL (ref 6–23)
CHLORIDE: 100 meq/L (ref 96–112)
CO2: 26 mEq/L (ref 19–32)
Calcium: 9.5 mg/dL (ref 8.4–10.5)
Creat: 0.85 mg/dL (ref 0.50–1.35)
GFR, Est Non African American: >89 mL/min
Glucose, Bld: 155 mg/dL — ABNORMAL HIGH (ref 70–99)
Potassium: 4.6 mEq/L (ref 3.5–5.3)
SODIUM: 138 meq/L (ref 135–145)

## 2014-09-20 LAB — LIPID PANEL
CHOLESTEROL: 140 mg/dL (ref 0–200)
HDL: 81 mg/dL (ref >39)
LDL Cholesterol: 46 mg/dL (ref 0–99)
TRIGLYCERIDES: 67 mg/dL (ref <150)
Total CHOL/HDL Ratio: 1.7 Ratio
VLDL: 13 mg/dL (ref 0–40)

## 2014-09-20 LAB — VITAMIN B12: Vitamin B-12: 645 pg/mL (ref 211–911)

## 2014-09-20 LAB — HEPATIC FUNCTION PANEL
ALT: 23 U/L (ref 0–53)
AST: 44 U/L — ABNORMAL HIGH (ref 0–37)
Albumin: 4 g/dL (ref 3.5–5.2)
Alkaline Phosphatase: 37 U/L — ABNORMAL LOW (ref 39–117)
BILIRUBIN DIRECT: 0.1 mg/dL (ref 0.0–0.3)
BILIRUBIN TOTAL: 0.5 mg/dL (ref 0.2–1.2)
Indirect Bilirubin: 0.4 mg/dL (ref 0.2–1.2)
Total Protein: 6.9 g/dL (ref 6.0–8.3)

## 2014-09-20 LAB — HEPATITIS C RNA QUANTITATIVE
HCV QUANT LOG: 6.57 {Log} — AB (ref <1.18)
HCV QUANT: 3700788 [IU]/mL — AB (ref <15)

## 2014-09-20 LAB — PSA: PSA: 1.85 ng/mL (ref <=4.00)

## 2014-09-20 LAB — MAGNESIUM: Magnesium: 1.6 mg/dL (ref 1.5–2.5)

## 2014-09-20 LAB — IRON AND TIBC
%SAT: 22 % (ref 20–55)
IRON: 62 ug/dL (ref 42–165)
TIBC: 278 ug/dL (ref 215–435)
UIBC: 216 ug/dL (ref 125–400)

## 2014-09-20 LAB — URINALYSIS, MICROSCOPIC ONLY
Bacteria, UA: NONE SEEN
CRYSTALS: NONE SEEN
Casts: NONE SEEN
SQUAMOUS EPITHELIAL / LPF: NONE SEEN

## 2014-09-20 LAB — HEPATITIS B SURFACE ANTIBODY,QUALITATIVE: HEP B S AB: POSITIVE — AB

## 2014-09-20 LAB — HEPATITIS A ANTIBODY, TOTAL: HEP A TOTAL AB: NONREACTIVE

## 2014-09-20 LAB — HIV ANTIBODY (ROUTINE TESTING W REFLEX): HIV 1&2 Ab, 4th Generation: NONREACTIVE

## 2014-09-20 LAB — VITAMIN D 25 HYDROXY (VIT D DEFICIENCY, FRACTURES): Vit D, 25-Hydroxy: 40 ng/mL (ref 30–100)

## 2014-09-20 LAB — HEMOGLOBIN A1C
Hgb A1c MFr Bld: 5.8 % — ABNORMAL HIGH (ref <5.7)
MEAN PLASMA GLUCOSE: 120 mg/dL — AB (ref <117)

## 2014-09-20 LAB — TESTOSTERONE: TESTOSTERONE: 140 ng/dL — AB (ref 300–890)

## 2014-09-20 LAB — INSULIN, FASTING: Insulin fasting, serum: 14.2 u[IU]/mL (ref 2.0–19.6)

## 2014-09-20 LAB — HEPATITIS B CORE ANTIBODY, TOTAL: Hep B Core Total Ab: REACTIVE — AB

## 2014-09-20 LAB — RPR

## 2014-09-20 LAB — HEPATITIS C ANTIBODY: HCV AB: REACTIVE — AB

## 2014-09-20 LAB — TSH: TSH: 1.537 u[IU]/mL (ref 0.350–4.500)

## 2014-09-23 ENCOUNTER — Encounter: Payer: Self-pay | Admitting: Internal Medicine

## 2014-09-23 LAB — HEPATITIS B E ANTIBODY: Hepatitis Be Antibody: NONREACTIVE

## 2014-09-30 ENCOUNTER — Other Ambulatory Visit: Payer: Self-pay | Admitting: Physician Assistant

## 2014-09-30 MED ORDER — CLONAZEPAM 2 MG PO TABS: ORAL_TABLET | ORAL | Status: DC

## 2014-10-04 ENCOUNTER — Encounter: Payer: Self-pay | Admitting: Physician Assistant

## 2014-10-04 ENCOUNTER — Ambulatory Visit (INDEPENDENT_AMBULATORY_CARE_PROVIDER_SITE_OTHER): Payer: BC Managed Care – PPO | Admitting: Physician Assistant

## 2014-10-04 VITALS — BP 131/72 | HR 90 | Ht 71.0 in | Wt 152.0 lb

## 2014-10-04 DIAGNOSIS — M159 Polyosteoarthritis, unspecified: Secondary | ICD-10-CM | POA: Diagnosis not present

## 2014-10-04 DIAGNOSIS — F411 Generalized anxiety disorder: Secondary | ICD-10-CM | POA: Diagnosis not present

## 2014-10-04 DIAGNOSIS — M5137 Other intervertebral disc degeneration, lumbosacral region: Secondary | ICD-10-CM

## 2014-10-04 DIAGNOSIS — F410 Panic disorder [episodic paroxysmal anxiety] without agoraphobia: Secondary | ICD-10-CM | POA: Insufficient documentation

## 2014-10-04 DIAGNOSIS — K4090 Unilateral inguinal hernia, without obstruction or gangrene, not specified as recurrent: Secondary | ICD-10-CM

## 2014-10-04 DIAGNOSIS — Z8619 Personal history of other infectious and parasitic diseases: Secondary | ICD-10-CM

## 2014-10-04 HISTORY — DX: Panic disorder (episodic paroxysmal anxiety): F41.0

## 2014-10-04 MED ORDER — CLONAZEPAM 2 MG PO TABS: ORAL_TABLET | ORAL | Status: DC

## 2014-10-04 MED ORDER — FLUOXETINE HCL 20 MG PO TABS: 20.0000 mg | ORAL_TABLET | Freq: Every day | ORAL | Status: DC

## 2014-10-04 NOTE — Patient Instructions (Signed)
Start prozac daily.

## 2014-10-08 DIAGNOSIS — Z8619 Personal history of other infectious and parasitic diseases: Secondary | ICD-10-CM | POA: Insufficient documentation

## 2014-10-08 DIAGNOSIS — K4090 Unilateral inguinal hernia, without obstruction or gangrene, not specified as recurrent: Secondary | ICD-10-CM

## 2014-10-08 HISTORY — DX: Unilateral inguinal hernia, without obstruction or gangrene, not specified as recurrent: K40.90

## 2014-10-08 HISTORY — DX: Personal history of other infectious and parasitic diseases: Z86.19

## 2014-10-08 NOTE — Progress Notes (Signed)
Subjective:    Patient ID: Edward Cuevas, male    DOB: Nov 11, 1955, 58 y.o.   MRN: [id number]  HPI Pt is a 58 yo male who presents to the clinic to establish care.   .. Active Ambulatory Problems    Diagnosis Date Noted  . Hyperlipidemia   . Hypertension   . Prediabetes   . Vitamin D deficiency   . DJD 10/23/2013  . DDD (degenerative disc disease), lumbosacral 10/23/2013  . Encounter for long-term (current) use of other medications 04/08/2014  . Panic attacks 10/04/2014  . History of hepatitis C 10/08/2014   Resolved Ambulatory Problems    Diagnosis Date Noted  . No Resolved Ambulatory Problems   Past Medical History  Diagnosis Date  . Arthritis   . Hepatitis    .[name] Family History  Problem Relation Age of Onset  . Diabetes Mother   . Heart disease Father   . Cancer Father   . Heart disease Maternal Grandmother    .[name] History   Social History  . Marital Status: Single    Spouse Name: N/A    Number of Children: N/A  . Years of Education: N/A   Occupational History  . Not on file.   Social History Main Topics  . Smoking status: Current Every Day Smoker -- 1.00 packs/day    Types: Cigarettes  . Smokeless tobacco: Never Used  . Alcohol Use: 9.6 oz/week    12 Not specified, 4 Cans of beer per week     Comment: Beer on occasion  . Drug Use: No  . Sexual Activity: Not Currently   Other Topics Concern  . Not on file   Social History Narrative   Other than establishing care patient is concerned because he did not know he was addicted to [place]. He has been on for many years. He takes 1/2-1 tab 3 times a day. He does have daily anxiety and panic attacks. He feels like this medicine does help to control them. He was out of it for one week and he had to go to the emergency room because he could not function. He was unaware that this medication does that. He would like to find other ways to control his anxiety.  DDD-patient uses Tylenol 3 on a daily basis to control  pain. He has disability for this.  He was unaware but at ED found right inguinal hernia. Referred from Ed.    Review of Systems  All other systems reviewed and are negative.      Objective:   Physical Exam  Constitutional: He is oriented to person, place, and time. He appears well-developed and well-nourished.  HENT:  Head: Normocephalic and atraumatic.  Cardiovascular: Normal rate, regular rhythm and normal heart sounds.   Pulmonary/Chest: Effort normal and breath sounds normal. He has no wheezes.  Neurological: He is alert and oriented to person, place, and time.  Skin: Skin is dry.  Psychiatric: His behavior is normal.  Seemed anxious.               GAD/panic attacks- pt seems very receptive to come off of benzo slowly to an as needed basis. Until ER visit for withdrawls he states he had no clue what the medication was doing to him. We added prozac today. After next week pt aware to decrease to 1/2-1 tablet twice a day. Follow up in 4-6 weeks.   DDD- discussed that is what he has had disability for. Does not need refill of tylenol 3  at this time. Discussed referral to Dr. Darene Lamer for other treatment plan. Would like to hold off.   Right inguinal hernia- has appt with surgeon.

## 2014-11-01 ENCOUNTER — Encounter: Payer: Self-pay | Admitting: Physician Assistant

## 2014-11-01 ENCOUNTER — Ambulatory Visit (INDEPENDENT_AMBULATORY_CARE_PROVIDER_SITE_OTHER): Payer: 59 | Admitting: Physician Assistant

## 2014-11-01 VITALS — BP 125/61 | HR 65 | Ht 71.0 in | Wt 163.0 lb

## 2014-11-01 DIAGNOSIS — F411 Generalized anxiety disorder: Secondary | ICD-10-CM

## 2014-11-01 DIAGNOSIS — K4090 Unilateral inguinal hernia, without obstruction or gangrene, not specified as recurrent: Secondary | ICD-10-CM

## 2014-11-01 DIAGNOSIS — F410 Panic disorder [episodic paroxysmal anxiety] without agoraphobia: Secondary | ICD-10-CM

## 2014-11-01 HISTORY — DX: Generalized anxiety disorder: F41.1

## 2014-11-01 MED ORDER — FLUOXETINE HCL 40 MG PO CAPS: 40.0000 mg | ORAL_CAPSULE | Freq: Every day | ORAL | Status: DC

## 2014-11-01 MED ORDER — CLONAZEPAM 1 MG PO TABS: 1.0000 mg | ORAL_TABLET | Freq: Two times a day (BID) | ORAL | Status: DC

## 2014-11-01 NOTE — Patient Instructions (Signed)
Will schedule general surgery appt.  Increase prozac to 40mg  daily.

## 2014-11-04 NOTE — Progress Notes (Signed)
   Subjective:    Patient ID: Edward Cuevas, male    DOB: Nov 02, 1955, 59 y.o.   MRN: [id number]  HPI Pt presents to the clinic to follow up on anxiety and panic attacks. As long as has klonapin does not have panic attacks. Still has anxiety but does feel like it is some better. He has decreased klonapin to 2mg  1/2 tablets twice a day. He is taking prozac in the morning. He states he does feel funny in the am but does know if due to prozac or not taking first dose of klonapin.   His right hernia is worsening. ER made referral via note. However pt unaware and needs another referral. Certainly a mass pops in and out but does not cause pain. Worse with lifting, sitting up and going to bathrooom. Hx of left sided hernia repair.    Review of Systems  All other systems reviewed and are negative.      Objective:   Physical Exam  Constitutional: He is oriented to person, place, and time. He appears well-developed and well-nourished.  HENT:  Head: Normocephalic and atraumatic.  Cardiovascular: Normal rate, regular rhythm and normal heart sounds.   Pulmonary/Chest: Effort normal and breath sounds normal.  Abdominal:  Right sided inguinal hernia bulge that is reducible. No pain or tenderness. Seen best with sitting up then can be reduced and seen no more.   Neurological: He is alert and oriented to person, place, and time.  Skin: Skin is dry.  Psychiatric: He has a normal mood and affect. His behavior is normal.          Assessment & Plan:   Anxiety and panic attacks- GAD-7 was 12. He did not do one the first time and do not have comparison. Will increase prozac to 40mg  and discussed to take a bedtime. klonapin rx decreased to 1mg  and discussed goal for next 3 months is to get to 1/2 tablet twice a day on this dose. Wrote for full tablet while prozac is kicking in.   Right hernia- will refer personally to general surgery for repair. Discussed warning signs of hernia and if red flags occur to  receive immediate medical attention.

## 2014-12-23 ENCOUNTER — Ambulatory Visit: Payer: Self-pay | Admitting: Physician Assistant

## 2014-12-23 ENCOUNTER — Other Ambulatory Visit (INDEPENDENT_AMBULATORY_CARE_PROVIDER_SITE_OTHER): Payer: Self-pay | Admitting: Surgery

## 2015-01-31 ENCOUNTER — Ambulatory Visit (INDEPENDENT_AMBULATORY_CARE_PROVIDER_SITE_OTHER): Payer: 59 | Admitting: Physician Assistant

## 2015-01-31 ENCOUNTER — Encounter: Payer: Self-pay | Admitting: Physician Assistant

## 2015-01-31 VITALS — BP 134/83 | HR 77 | Wt 167.0 lb

## 2015-01-31 DIAGNOSIS — M7021 Olecranon bursitis, right elbow: Secondary | ICD-10-CM | POA: Diagnosis not present

## 2015-01-31 DIAGNOSIS — F410 Panic disorder [episodic paroxysmal anxiety] without agoraphobia: Secondary | ICD-10-CM | POA: Diagnosis not present

## 2015-01-31 DIAGNOSIS — F411 Generalized anxiety disorder: Secondary | ICD-10-CM

## 2015-01-31 DIAGNOSIS — M7020 Olecranon bursitis, unspecified elbow: Secondary | ICD-10-CM | POA: Insufficient documentation

## 2015-01-31 MED ORDER — IBUPROFEN 800 MG PO TABS: 800.0000 mg | ORAL_TABLET | Freq: Three times a day (TID) | ORAL | Status: DC | PRN

## 2015-01-31 MED ORDER — CLONAZEPAM 0.5 MG PO TABS: 0.5000 mg | ORAL_TABLET | Freq: Three times a day (TID) | ORAL | Status: DC | PRN

## 2015-01-31 NOTE — Patient Instructions (Addendum)
Olecranon Bursitis  with Rehab A bursa is a fluid-filled sac that is located between soft tissues (ligaments, tendons, skin) and bones. The purpose of a bursa is to allow the soft tissue to function smoothly, without friction. The olecranon bursa is located between the back of the elbow (olecranon) and the skin. Olecranon bursitis involves inflammation of this bursa, resulting in pain. SYMPTOMS   Pain, tenderness, swelling, warmth, or redness over the back of the elbow.  Reduced range of motion of the affected elbow.  Sometimes, severe pain with movement of the affected elbow.  Crackling sound (crepitation) when the bursa is moved or touched.  Often, painless swelling of the bursa.  Fever (when infected). CAUSES  Olecranon bursitis is often caused by direct hit (trauma) to the elbow. Less commonly, it is due to overuse and/or strenuous exercise that the elbow is not used to. RISK INCREASES WITH:  Sports that require bending or landing on the elbow (football, volleyball).  Vigorous or repetitive athletic training, or sudden increase or change in activity level (weekend warriors).  Failure to warm up properly before activity.  Poor exercise technique.  Playing on artificial turf. PREVENTION  Avoid injuries and the overuse of muscles whenever possible.  Warm up and stretch properly before activity.  Allow for adequate recovery between workouts.  Maintain physical fitness:  Strength, flexibility, and endurance.  Cardiovascular fitness.  Learn and use proper technique.  Wear properly fitted and padded protective equipment. PROGNOSIS  If treated properly, olecranon bursitis is usually curable within 2 weeks.  RELATED COMPLICATIONS   Longer healing time, if not properly treated or if not given enough time to heal.  Recurring symptoms that result in a chronic problem.  Joint stiffness with permanent limitation of the affected joint's movement.  Infection of the  bursa.  Chronic inflammation or scarring of the bursa. TREATMENT Treatment first involves the use of ice and medicine to reduce pain and inflammation. The use of strengthening and stretching exercises may help reduce pain with activity. These exercises may be performed at home or with a therapist. Elbow pads may be advised to protect the bursa. If symptoms persist despite nonsurgical treatment, a procedure to withdraw fluid from the bursa may be advised. This procedure may be accompanied with an injection of corticosteroids to reduce inflammation. Sometimes, surgery is needed to remove the bursa. MEDICATION  If pain medicine is needed, nonsteroidal anti-inflammatory medicines (aspirin and ibuprofen), or other minor pain relievers (acetaminophen), are often advised.  Do not take pain medicine for 7 days before surgery.  Prescription pain relievers may be given, if your caregiver thinks they are needed. Use only as directed and only as much as you need.  Corticosteroid injections may be given by your caregiver. These injections should be reserved for the most serious cases, because they may only be given a certain number of times. HEAT AND COLD  Cold treatment (icing) should be applied for 10 to 15 minutes every 2 to 3 hours for inflammation and pain, and immediately after activity that aggravates your symptoms. Use ice packs or an ice massage.  Heat treatment may be used before performing stretching and strengthening activities prescribed by your caregiver, physical therapist, or athletic trainer. Use a heat pack or a warm water soak. SEEK IMMEDIATE MEDICAL CARE IF:   Symptoms get worse or do not improve in 2 weeks, despite treatment.  Signs of infection develop, including fever of 102 F (38.9 C), increased pain, redness, warmth, or pus draining from the  bursa.  New, unexplained symptoms develop. (Drugs used in treatment may produce side effects.) EXERCISES  RANGE OF MOTION (ROM) AND  STRETCHING EXERCISES - Olecranon Bursitis These exercises may help you when beginning to rehabilitate your injury. Your symptoms may resolve with or without further involvement from your physician, physical therapist or athletic trainer. While completing these exercises, remember:   Restoring tissue flexibility helps normal motion to return to the joints. This allows healthier, less painful movement and activity.  An effective stretch should be held for at least 30 seconds.  A stretch should never be painful. You should only feel a gentle lengthening or release in the stretched tissue. RANGE OF MOTION - Elbow Flexion, Supine  Lie on your back. Extend your right / left arm into the air, bracing it with your opposite hand. Allow your right / left arm to relax.  Let your elbow bend, allowing your hand to fall slowly toward your chest.  You should feel a gentle stretch along the back of your upper arm and elbow. Your physician, physical therapist or athletic trainer may ask you to hold a __________ hand weight to increase the intensity of this stretch.  Hold for __________ seconds. Slowly return your right / left arm to the upright position. Repeat __________ times. Complete this exercise __________ times per day. STRETCH - Elbow Flexors  Lie on a firm bed or countertop on your back. Be sure that you are in a comfortable position which will allow you to relax your arm muscles.  Place a folded towel under your right / left upper arm, so that your elbow and shoulder are at the same height. Extend your arm; your elbow should not rest on the bed or towel  Allow the weight of your hand to straighten your elbow. Keep your arm and chest muscles relaxed. Your caregiver may ask you to increase the intensity of your stretch by adding a small wrist or hand weight.  Hold for __________ seconds. You should feel a stretch on the inside of your elbow. Slowly return to the starting position. Repeat __________  times. Complete this exercise __________ times per day. STRENGTHENING EXERCISES - Olecranon Bursitis These exercises will help you regain your strength. They may resolve your symptoms with or without further involvement from your physician, physical therapist or athletic trainer. While completing these exercises, remember:   Muscles can gain both the endurance and the strength needed for everyday activities through controlled exercises.  Complete these exercises as instructed by your physician, physical therapist or athletic trainer. Increase the resistance and repetitions only as guided by your caregiver.  You may experience muscle soreness or fatigue, but the pain or discomfort you are trying to eliminate should never worsen during these exercises. If this pain does worsen, stop and make certain you are following the directions exactly. If the pain is still present after adjustments, discontinue the exercise until you can discuss the trouble with your caregiver. STRENGTH - Elbow Extensors, Isometric  Stand or sit upright on a firm surface. Place your right / left arm so that your palm faces your stomach, and it is at the height of your waist.  Place your opposite hand on the underside of your forearm. Gently push up as your right / left arm resists. Push as hard as you can with both arms without causing any pain or movement at your right / left elbow. Hold this stationary position for __________ seconds.  Gradually release the tension in both arms. Allow your  muscles to relax completely before repeating. Repeat __________ times. Complete this exercise __________ times per day. STRENGTH - Elbow Flexors, Isometric  Stand or sit upright on a firm surface. Place your right / left arm so that your hand is palm-up and at the height of your waist.  Place your opposite hand on top of your forearm. Gently push down as your right / left arm resists. Push as hard as you can with both arms without causing  any pain or movement at your right / left elbow. Hold this stationary position for __________ seconds.  Gradually release the tension in both arms. Allow your muscles to relax completely before repeating. Repeat __________ times. Complete this exercise __________ times per day. STRENGTH - Elbow Flexors, Supinated  With good posture, stand or sit on a firm chair without armrests. Allow your right / left arm to rest at your side with your palm facing forward.  Holding a __________ weight, or gripping a rubber exercise band or tubing,  bring your hand toward your shoulder.  Allow your muscles to control the resistance as your hand returns to your side. Repeat __________ times. Complete this exercise __________ times per day.  STRENGTH - Elbow Flexors, Neutral  With good posture, stand or sit on a firm chair without armrests. Allow your right / left arm to rest at your side with your thumb facing forward.  Holding a __________weight, or gripping a rubber exercise band or tubing,  bring your hand toward your shoulder.  Allow your muscles to control the resistance as your hand returns to your side. Repeat __________ times. Complete this exercise __________ times per day.  STRENGTH - Elbow Extensors  Lie on your back. Extend your right / left elbow into the air, pointing it toward the ceiling. Brace your arm with your opposite hand.*  Holding a __________ weight in your hand, slowly straighten your right / left elbow.  Allow your muscles to control the weight as your hand returns to its starting position. Repeat __________ times. Complete this exercise __________ times per day. *You may also stand with your elbow overhead and pointed toward the ceiling, supported by your opposite hand. STRENGTH - Elbow Extensors, Dynamic  With good posture, stand, or sit on a firm chair without armrests. Keeping your upper arms at your side, bring both hands up to your right / left shoulder while gripping  a rubber exercise band or tubing. Your right / left hand should be just below the other hand.  Straighten your right / left elbow. Hold for __________ seconds.  Allow your muscles to control the rubber exercise band, as your hand returns to your shoulder. Repeat __________ times. Complete this exercise __________ times per day. Document Released: 10/04/2005 Document Revised: 02/18/2014 Document Reviewed: 01/16/2009 [facility] Patient Information 2015 [place], [place]. This information is not intended to replace advice given to you by your health care provider. Make sure you discuss any questions you have with your health care provider.

## 2015-01-31 NOTE — Progress Notes (Signed)
   Subjective:    Patient ID: Edward Cuevas, male    DOB: April 24, 1956, 59 y.o.   MRN: [id number]  HPI Pt presents to the clinic with 2 week of swollen right elbow. Not painful. Does not remember any trauma. No redness or swelling. Nothing done to make better. Seems to be getting a little bit bigger. no fever or chills.   Pt needs refill on clonazepam. He completeley went off and felt very out of control. Per pt severe anxiety and worry. Not able to sleep and very irritable. He went back on clonazepam and did feel much better. Taking 1/2 tablet of 1mg  three times a day. He thought he was supposed to stop prozac.     Review of Systems  All other systems reviewed and are negative.      Objective:   Physical Exam  Constitutional: He is oriented to person, place, and time. He appears well-developed and well-nourished.  HENT:  Head: Normocephalic and atraumatic.  Cardiovascular: Normal rate, regular rhythm and normal heart sounds.   Pulmonary/Chest: Effort normal and breath sounds normal. He has no wheezes.  Neurological: He is alert and oriented to person, place, and time.  Skin:     Psychiatric: He has a normal mood and affect. His behavior is normal.          Assessment & Plan:  Olecranon bursitis, right- elbow drained today. Wrapped in ACE. Ibuprofen up to three times a days for next 3 days then as needed. HO given for bursitits. Pt does not have insurance and request for sample not to be sent for culture. Looks normal today will not culture. Return if fluid accumulating.   GAD/Panic attacks- GAD-7 was 6. This was with him taking clonazepam and not completely off. He feels like he felt extremely. Will restart prozac with increase to 40mg  daily. Discussed ok with klonapin .5mg  tid after 4 weeks go to bid with prozac. Follow up in 3 months.

## 2015-02-10 ENCOUNTER — Other Ambulatory Visit: Payer: Self-pay | Admitting: *Deleted

## 2015-02-10 MED ORDER — ACETAMINOPHEN-CODEINE #4 300-60 MG PO TABS: ORAL_TABLET | ORAL | Status: DC

## 2015-04-07 ENCOUNTER — Ambulatory Visit: Payer: Self-pay | Admitting: Internal Medicine

## 2015-04-29 ENCOUNTER — Encounter: Payer: Self-pay | Admitting: Internal Medicine

## 2015-06-10 ENCOUNTER — Other Ambulatory Visit: Payer: Self-pay

## 2015-06-10 MED ORDER — CLONAZEPAM 0.5 MG PO TABS: 0.5000 mg | ORAL_TABLET | Freq: Three times a day (TID) | ORAL | Status: DC | PRN

## 2015-06-10 NOTE — Telephone Encounter (Signed)
Patient advised to schedule a follow up appointment.  

## 2015-06-16 ENCOUNTER — Ambulatory Visit: Payer: 59 | Admitting: Physician Assistant

## 2015-06-27 ENCOUNTER — Ambulatory Visit (INDEPENDENT_AMBULATORY_CARE_PROVIDER_SITE_OTHER): Payer: 59 | Admitting: Physician Assistant

## 2015-06-27 ENCOUNTER — Encounter: Payer: Self-pay | Admitting: Physician Assistant

## 2015-06-27 VITALS — BP 127/71 | HR 85 | Ht 71.0 in | Wt 169.0 lb

## 2015-06-27 DIAGNOSIS — L570 Actinic keratosis: Secondary | ICD-10-CM | POA: Diagnosis not present

## 2015-06-27 DIAGNOSIS — F410 Panic disorder [episodic paroxysmal anxiety] without agoraphobia: Secondary | ICD-10-CM

## 2015-06-27 DIAGNOSIS — F411 Generalized anxiety disorder: Secondary | ICD-10-CM | POA: Diagnosis not present

## 2015-06-27 HISTORY — DX: Actinic keratosis: L57.0

## 2015-06-27 MED ORDER — FLUOROURACIL 5 % EX CREA: TOPICAL_CREAM | Freq: Two times a day (BID) | CUTANEOUS | Status: DC

## 2015-06-27 MED ORDER — FLUOXETINE HCL 40 MG PO CAPS: 40.0000 mg | ORAL_CAPSULE | Freq: Two times a day (BID) | ORAL | Status: DC

## 2015-06-27 MED ORDER — CLONAZEPAM 0.5 MG PO TABS: 0.5000 mg | ORAL_TABLET | Freq: Four times a day (QID) | ORAL | Status: DC | PRN

## 2015-06-27 NOTE — Progress Notes (Addendum)
   Subjective:    Patient ID: Edward Cuevas, male    DOB: 05/06/1956, 59 y.o.   MRN: [id number]  HPI  Mr. Anselmi presents to clinic today for follow up of depression and anxiety. He reports his depression symptoms have not significantly improved, and he has increased panic and anxiety. He reports anhedonia, agitation, and isolation. He reports increasing desire to be alone with difficulty spending time with friends and family. He denies any sleep disturbances or changes in appetite. He also denies self harm or suicidal ideation. He states current fluoxetine dose has helped to slightly alleviate his symptoms, but is not providing adequate relief. He states he has increased his daily dose of klonopin, which he feels has helped to improve panic symptoms. Patient also expressed concerns about skin cancer.     Review of Systems  Musculoskeletal: Negative for back pain, arthralgias and neck pain.  Neurological: Negative for headaches.  Psychiatric/Behavioral: Positive for decreased concentration and agitation. Negative for sleep disturbance. The patient is nervous/anxious.        Objective:   Physical Exam  Constitutional: He appears well-developed and well-nourished.  Cardiovascular: Normal rate, regular rhythm and normal heart sounds.   Pulmonary/Chest: Effort normal and breath sounds normal.  Skin:  Many areas all over body of scaly flaky lesion with erythematous base. Several lesions appear to have a erythematous papule rising up and/or some scab formation over treated lesion.   Psychiatric:  Very anxious.    PHQ-9 score of 16 GAD-7 score of 12      Assessment & Plan:  Depression- Not adequately managed with current dose of fluoxetine. Increase dose to 80mg  and follow up in 4 weeks. Cost is an issue for patient. There are not a lot of medications he can afford.   Anxiety- Continue current dose of klonopin. Discussed referral to psychiatry for management once he receives health  insurance in February 2017. Discussed with patient deep breathing exercises.   Actinic keratosis- Distributed in diffuse pattern throughout body. Many are suspicious for basal cell carcinoma. Discussed referral to dermatology for excision once he receives health insurance. Rx fluorouracil 5% cream.   Addendum and reviewed by Iran Planas PA-C

## 2015-06-27 NOTE — Patient Instructions (Signed)
Increased prozac to 80mg  daily.  klonapin as needed.

## 2015-07-07 ENCOUNTER — Encounter: Payer: Self-pay | Admitting: Internal Medicine

## 2015-07-18 ENCOUNTER — Other Ambulatory Visit: Payer: Self-pay

## 2015-07-18 MED ORDER — ACETAMINOPHEN-CODEINE #4 300-60 MG PO TABS: ORAL_TABLET | ORAL | Status: DC

## 2015-08-27 ENCOUNTER — Emergency Department (HOSPITAL_COMMUNITY): Payer: Self-pay

## 2015-08-27 ENCOUNTER — Encounter (HOSPITAL_COMMUNITY): Payer: Self-pay | Admitting: Emergency Medicine

## 2015-08-27 ENCOUNTER — Emergency Department (HOSPITAL_COMMUNITY): Payer: 59

## 2015-08-27 ENCOUNTER — Emergency Department (HOSPITAL_COMMUNITY)
Admission: EM | Admit: 2015-08-27 | Discharge: 2015-08-27 | Disposition: A | Discharge status: Home / Self Care | Payer: Self-pay | Attending: Emergency Medicine | Admitting: Emergency Medicine

## 2015-08-27 DIAGNOSIS — Z720 Tobacco use: Secondary | ICD-10-CM | POA: Insufficient documentation

## 2015-08-27 DIAGNOSIS — T148XXA Other injury of unspecified body region, initial encounter: Secondary | ICD-10-CM

## 2015-08-27 DIAGNOSIS — Z880 Allergy status to penicillin: Secondary | ICD-10-CM | POA: Insufficient documentation

## 2015-08-27 DIAGNOSIS — W1839XA Other fall on same level, initial encounter: Secondary | ICD-10-CM | POA: Insufficient documentation

## 2015-08-27 DIAGNOSIS — Z8739 Personal history of other diseases of the musculoskeletal system and connective tissue: Secondary | ICD-10-CM | POA: Insufficient documentation

## 2015-08-27 DIAGNOSIS — Z79899 Other long term (current) drug therapy: Secondary | ICD-10-CM | POA: Insufficient documentation

## 2015-08-27 DIAGNOSIS — Z8619 Personal history of other infectious and parasitic diseases: Secondary | ICD-10-CM | POA: Insufficient documentation

## 2015-08-27 DIAGNOSIS — Y998 Other external cause status: Secondary | ICD-10-CM | POA: Insufficient documentation

## 2015-08-27 DIAGNOSIS — S63259A Unspecified dislocation of unspecified finger, initial encounter: Secondary | ICD-10-CM

## 2015-08-27 DIAGNOSIS — S62623A Displaced fracture of medial phalanx of left middle finger, initial encounter for closed fracture: Secondary | ICD-10-CM | POA: Insufficient documentation

## 2015-08-27 DIAGNOSIS — Y9289 Other specified places as the place of occurrence of the external cause: Secondary | ICD-10-CM | POA: Insufficient documentation

## 2015-08-27 DIAGNOSIS — E559 Vitamin D deficiency, unspecified: Secondary | ICD-10-CM | POA: Insufficient documentation

## 2015-08-27 DIAGNOSIS — Y9389 Activity, other specified: Secondary | ICD-10-CM | POA: Insufficient documentation

## 2015-08-27 DIAGNOSIS — I10 Essential (primary) hypertension: Secondary | ICD-10-CM | POA: Insufficient documentation

## 2015-08-27 DIAGNOSIS — S63283A Dislocation of proximal interphalangeal joint of left middle finger, initial encounter: Secondary | ICD-10-CM | POA: Insufficient documentation

## 2015-08-27 MED ORDER — BUPIVACAINE HCL (PF) 0.5 % IJ SOLN
10.0000 mL | Freq: Once | INTRAMUSCULAR | Status: AC
  Administered 2015-08-27: 10 mL
  Filled 2015-08-27: qty 30

## 2015-08-27 MED ORDER — IBUPROFEN 800 MG PO TABS: 800.0000 mg | ORAL_TABLET | Freq: Once | ORAL | Status: DC

## 2015-08-27 MED ORDER — HYDROCODONE-ACETAMINOPHEN 5-325 MG PO TABS: 1.0000 | ORAL_TABLET | Freq: Four times a day (QID) | ORAL | Status: DC | PRN

## 2015-08-27 MED ORDER — HYDROCODONE-ACETAMINOPHEN 5-325 MG PO TABS
1.0000 | ORAL_TABLET | Freq: Once | ORAL | Status: AC
  Administered 2015-08-27: 1 via ORAL
  Filled 2015-08-27: qty 1

## 2015-08-27 NOTE — ED Provider Notes (Signed)
CSN: [id number]     Arrival date & time 08/27/15  1106 History   First MD Initiated Contact with Patient 08/27/15 1151     Chief Complaint  Patient presents with  . Finger Injury     (Consider location/radiation/quality/duration/timing/severity/associated sxs/prior Treatment) HPI Comments: Edward Cuevas is a(n) 59 y.o. male who presents with complaint of Left middle finger pain. Patient states that he was getting up off of a low bed on Sunday which was 3 days ago. He states that his knees gave out and he sat down hard on his Left middle finger. He has had worsening pain, swelling, bruising and stiffness. He has an obvious deformity. No previous injuries.  Patient is a 59 y.o. male presenting with hand pain. The history is provided by the patient.  Hand Pain This is a new problem. Episode onset: 3 days. The problem occurs constantly. The problem has been gradually worsening. Associated symptoms include joint swelling. Pertinent negatives include no abdominal pain, anorexia, arthralgias, change in bowel habit, chest pain, chills, congestion, coughing, diaphoresis, fatigue, fever, headaches, myalgias, nausea, neck pain, numbness, rash, sore throat, swollen glands, urinary symptoms, vertigo, visual change, vomiting or weakness. The symptoms are aggravated by bending. He has tried ice and acetaminophen for the symptoms. The treatment provided no relief.    Past Medical History  Diagnosis Date  . Hyperlipidemia   . Hypertension   . Prediabetes   . Vitamin D deficiency   . Arthritis   . Hepatitis     A,B,C   Past Surgical History  Procedure Laterality Date  . Elbow surgery Right 1986  . Hernia repair  1996  . Shoulder surgery Left 2007  . Hemorrhoid surgery  1977 and 1980   Family History  Problem Relation Age of Onset  . Diabetes Mother   . Heart disease Father   . Cancer Father   . Heart disease Maternal Grandmother    Social History  Substance Use Topics  . Smoking status:  Current Every Day Smoker -- 1.00 packs/day    Types: Cigarettes  . Smokeless tobacco: Never Used  . Alcohol Use: 9.6 oz/week    12 Standard drinks or equivalent, 4 Cans of beer per week     Comment: Beer on occasion    Review of Systems  Constitutional: Negative for fever, chills, diaphoresis and fatigue.  HENT: Negative for congestion and sore throat.   Respiratory: Negative for cough.   Cardiovascular: Negative for chest pain.  Gastrointestinal: Negative for nausea, vomiting, abdominal pain, anorexia and change in bowel habit.  Musculoskeletal: Positive for joint swelling. Negative for myalgias, arthralgias and neck pain.  Skin: Negative for rash.  Neurological: Negative for vertigo, weakness, numbness and headaches.  All other systems reviewed and are negative.     Allergies  Penicillins  Home Medications   Prior to Admission medications   Medication Sig Start Date End Date Taking? Authorizing Provider  acetaminophen-codeine (TYLENOL #4) 300-60 MG tablet Take 1/2 to 1 tablet 3 to 4 x daily as needed for severe pain 07/18/15   Jade L Breeback, PA-C  Cholecalciferol (VITAMIN D PO) Take by mouth daily. Takes 5000 units daily    Historical Provider, MD  clonazePAM (KLONOPIN) 0.5 MG tablet Take 1 tablet (0.5 mg total) by mouth 4 (four) times daily as needed for anxiety. 06/27/15   Jade L Breeback, PA-C  fluorouracil (EFUDEX) 5 % cream Apply topically 2 (two) times daily. 06/27/15   Jade L Breeback, PA-C  FLUoxetine (PROZAC) 40 MG  capsule Take 1 capsule (40 mg total) by mouth 2 (two) times daily. 06/27/15   Jade L Breeback, PA-C  HYDROcodone-acetaminophen (NORCO) 5-325 MG tablet Take 1-2 tablets by mouth every 6 (six) hours as needed for moderate pain. 08/27/15   Damyah Gugel, PA-C   BP 119/74 mmHg  Pulse 88  Temp(Src) 98.2 F (36.8 C) (Oral)  Resp 20  SpO2 100% Physical Exam  Constitutional: He appears well-developed and well-nourished. No distress.  HENT:  Head: Normocephalic  and atraumatic.  Eyes: Conjunctivae are normal. No scleral icterus.  Neck: Normal range of motion. Neck supple.  Cardiovascular: Normal rate, regular rhythm and normal heart sounds.   Pulmonary/Chest: Effort normal and breath sounds normal. No respiratory distress.  Abdominal: Soft. There is no tenderness.  Musculoskeletal: He exhibits edema and tenderness.  Left middle finger with bruising, swelling and deformity. Proximal phalanx appears depressed and suggests dislocation.  Neurological: He is alert.  Skin: Skin is warm and dry. He is not diaphoretic.  Psychiatric: His behavior is normal.  Nursing note and vitals reviewed.   ED Course  .Nerve Block Date/Time: 08/28/2015 8:46 PM Performed by: Margarita Mail Authorized by: Margarita Mail Consent: Verbal consent obtained. Risks and benefits: risks, benefits and alternatives were discussed Consent given by: patient Patient identity confirmed: verbally with patient and provided demographic data Time out: Immediately prior to procedure a "time out" was called to verify the correct patient, procedure, equipment, support staff and site/side marked as required. Indications: pain relief and dislocation Body area: upper extremity Nerve: digital Laterality: left Patient sedated: no Preparation: Patient was prepped and draped in the usual sterile fashion. Patient position: sitting Needle gauge: 67 G Location technique: anatomical landmarks Local anesthetic: bupivacaine 0.5% without epinephrine Anesthetic total: 10 ml Outcome: pain improved Patient tolerance: Patient tolerated the procedure well with no immediate complications   (including critical care time) Labs Review Labs Reviewed - No data to display  Imaging Review Dg Finger Middle Left  08/27/2015  CLINICAL DATA:  Status post reduction of left middle finger. EXAM: LEFT MIDDLE FINGER 2+V COMPARISON:  Earlier today at 1210 hours FINDINGS: 1338 hours. Details obscured by  overlying splint. Degenerative changes, including within the proximal and distal interphalangeal joints. Interval relocation of the proximal interphalangeal joint of the left middle finger. Volar plate avulsion fracture identified about the proximal aspect of the middle phalanx. Dorsal soft tissue swelling. IMPRESSION: Interval relocation of the proximal interphalangeal joint. Volar plate avulsion fracture, as before. Electronically Signed   By: Adan Baehr Miyamoto M.D.   On: 08/27/2015 13:58   Dg Finger Middle Left  08/27/2015  CLINICAL DATA:  LEFT middle finger bruising and deformity EXAM: LEFT MIDDLE FINGER 2+V COMPARISON:  None. FINDINGS: There is a dislocation of the proximal interphalangeal joint of the LEFT third digit. The middle phalanx is subluxed 1 bone width superiorly. There is an avulsion fracture presumably from the base of the middle phalanx seen on lateral projection. IMPRESSION: Subluxation/ dislocation at the proximal interphalangeal joint of the third digit with avulsion of the base of the middle phalanx. Electronically Signed   By: Suzy Bouchard M.D.   On: 08/27/2015 12:47   I have personally reviewed and evaluated these images and lab results as part of my medical decision-making.   EKG Interpretation None      MDM   Final diagnoses:  Avulsion fracture  Dislocation closed, finger, initial encounter    Patient finger dislocation reduced in the ED. Patient seen in shared visit with attending  physician. Post reduction films show good alignment. Patient will be discharged to follow up with pcp , ortho if sxs worsen.     Margarita Mail, PA-C 08/28/15 2049  Deno Etienne, DO 08/29/15 [phone]

## 2015-08-27 NOTE — Discharge Instructions (Signed)
Finger Dislocation Finger dislocation is the displacement of bones in your finger at the joints. Most commonly, finger dislocation occurs at the proximal interphalangeal joint (the joint closest to your knuckle). Very strong, fibrous tissues (ligaments) and joint capsules connect the three bones of your fingers.  CAUSES Dislocation is caused by a forceful impact. This impact moves these bones off the joint and often tears your ligaments.  SYMPTOMS Symptoms of finger dislocation include:  Deformity of your finger.  Pain, with loss of movement. DIAGNOSIS  Finger dislocation is diagnosed with a physical exam. Often, X-ray exams are done to see if you have associated injuries, such as bone fractures. TREATMENT  Finger dislocations are treated by putting your bones back into position (reduction) either by manually moving the bones back into place or through surgery. Your finger is then kept in a fixed position (immobilized) with the use of a dressing or splint for a brief period. When your ligament has to be surgically repaired, it needs to be kept in a fixed position with a dressing or splint for 1 to 2 weeks. Because joint stiffness is a long-term complication of finger dislocation, hand exercises or physical therapy to increase the range of motion and to regain strength is usually started as soon as the ligament is healed. Exercises and therapy generally last no more than 3 months. HOME CARE INSTRUCTIONS The following measures can help to reduce pain and speed up the healing process:  Rest your injured joint. Do not move until instructed otherwise by your caregiver. Avoid activities similar to the one that caused your injury.  Apply ice to your injured joint for the first day or 2 after your reduction or as directed by your caregiver. Applying ice helps to reduce inflammation and pain.  Put ice in a plastic bag.  Place a towel between your skin and the bag.  Leave the ice on for 15-20 minutes  at a time, every 2 hours while you are awake.  Elevate your hand above your heart as directed by your caregiver to reduce swelling.  Take over-the-counter or prescription medicine for pain as your caregiver instructs you. SEEK IMMEDIATE MEDICAL CARE IF:  Your dressing or splint becomes damaged.  Your pain becomes worse rather than better.  You lose feeling in your finger, or it becomes cold and white. MAKE SURE YOU:  Understand these instructions.  Will watch your condition.  Will get help right away if you are not doing well or get worse.   This information is not intended to replace advice given to you by your health care provider. Make sure you discuss any questions you have with your health care provider.   Document Released: 10/01/2000 Document Revised: 10/25/2014 Document Reviewed: 02/28/2015 Elsevier Interactive Patient Education 2016 [employer]  

## 2015-08-27 NOTE — ED Provider Notes (Signed)
Reduction of dislocation Date/Time: 1:07 PM Performed by: Cecilio Asper Authorized by: Cecilio Asper Consent: Verbal consent obtained. Risks and benefits: risks, benefits and alternatives were discussed Consent given by: patient Required items: required blood products, implants, devices, and special equipment available Time out: Immediately prior to procedure a "time out" was called to verify the correct patient, procedure, equipment, support staff and site/side marked as required.  Patient sedated: no  Vitals: Vital signs were monitored during sedation. Patient tolerance: Patient tolerated the procedure well with no immediate complications. Joint: left PIP Reduction technique: distraction    Medical screening examination/treatment/procedure(s) were conducted as a shared visit with non-physician practitioner(s) and myself.  I personally evaluated the patient during the encounter.   EKG Interpretation None       See the written copy of this report in the patient's paper medical record.  These results did not interface directly into the electronic medical record and are summarized here.  59 year old male with a chief complaints of left finger pain. Patient had sat on it and had significant pain and deformity. This happened about 4 days ago. Patient now having worse pain. Dislocation noted on x-ray was reduced at bedside. Hand follow-up.  Deno Etienne, DO 08/29/15 [phone]

## 2015-08-27 NOTE — ED Notes (Signed)
Pt was trying to get up off his mattress Sunday and was pushing himself up when he fell back onto his left middle finger at an odd angle. Now is obviously swollen and bruised with pain to finger.

## 2015-09-04 ENCOUNTER — Encounter: Payer: Self-pay | Admitting: Gastroenterology

## 2015-10-09 ENCOUNTER — Encounter: Payer: Self-pay | Admitting: Internal Medicine

## 2015-10-14 ENCOUNTER — Other Ambulatory Visit: Payer: Self-pay | Admitting: Physician Assistant

## 2015-11-03 ENCOUNTER — Other Ambulatory Visit: Payer: Self-pay | Admitting: Physician Assistant

## 2015-11-12 ENCOUNTER — Other Ambulatory Visit: Payer: Self-pay | Admitting: Physician Assistant

## 2015-11-24 ENCOUNTER — Ambulatory Visit (INDEPENDENT_AMBULATORY_CARE_PROVIDER_SITE_OTHER): Payer: PPO | Admitting: Physician Assistant

## 2015-11-24 ENCOUNTER — Other Ambulatory Visit: Payer: Self-pay | Admitting: Physician Assistant

## 2015-11-24 ENCOUNTER — Encounter: Payer: Self-pay | Admitting: Physician Assistant

## 2015-11-24 VITALS — BP 138/80 | HR 79

## 2015-11-24 DIAGNOSIS — N400 Enlarged prostate without lower urinary tract symptoms: Secondary | ICD-10-CM | POA: Diagnosis not present

## 2015-11-24 DIAGNOSIS — M2550 Pain in unspecified joint: Secondary | ICD-10-CM

## 2015-11-24 DIAGNOSIS — K4090 Unilateral inguinal hernia, without obstruction or gangrene, not specified as recurrent: Secondary | ICD-10-CM

## 2015-11-24 DIAGNOSIS — R3915 Urgency of urination: Secondary | ICD-10-CM

## 2015-11-24 DIAGNOSIS — G8929 Other chronic pain: Secondary | ICD-10-CM

## 2015-11-24 DIAGNOSIS — R3912 Poor urinary stream: Secondary | ICD-10-CM

## 2015-11-24 DIAGNOSIS — F410 Panic disorder [episodic paroxysmal anxiety] without agoraphobia: Secondary | ICD-10-CM

## 2015-11-24 DIAGNOSIS — M5137 Other intervertebral disc degeneration, lumbosacral region: Secondary | ICD-10-CM

## 2015-11-24 DIAGNOSIS — F411 Generalized anxiety disorder: Secondary | ICD-10-CM

## 2015-11-24 MED ORDER — ACETAMINOPHEN-CODEINE #4 300-60 MG PO TABS: ORAL_TABLET | ORAL | Status: DC

## 2015-11-24 MED ORDER — FLUOXETINE HCL 40 MG PO CAPS: 40.0000 mg | ORAL_CAPSULE | Freq: Two times a day (BID) | ORAL | Status: DC

## 2015-11-24 MED ORDER — TAMSULOSIN HCL 0.4 MG PO CAPS: 0.4000 mg | ORAL_CAPSULE | Freq: Every day | ORAL | Status: DC

## 2015-11-24 MED ORDER — CLONAZEPAM 0.5 MG PO TABS: ORAL_TABLET | ORAL | Status: DC

## 2015-11-24 MED ORDER — DICLOFENAC SODIUM 75 MG PO TBEC: 75.0000 mg | DELAYED_RELEASE_TABLET | Freq: Two times a day (BID) | ORAL | Status: DC

## 2015-11-24 MED ORDER — FLUOROURACIL 5 % EX CREA: TOPICAL_CREAM | Freq: Two times a day (BID) | CUTANEOUS | Status: DC

## 2015-11-24 NOTE — Progress Notes (Addendum)
   Subjective:    Patient ID: Edward Cuevas, male    DOB: Dec 22, 1955, 60 y.o.   MRN: [id number]  HPI  Patient is a 60 year old male that presents with increasing urinary frequency, retention, and nocturia. Patient has a past medical history relevant for chronic pain, depression, anxiety, Hepatitis C, inguinal hernia, and actinic keratosis. Patient states that his urinary symptoms are preventing him from engaging in activities that he normally enjoys. He reports that "he gets up all night long to use bathroom preventing him from sleeping". He also complains of weak stream.  Patient states that his depressive symptoms have improved some since increasing his Fluoxetine dose to 80mg . However, he still experiences a sense of isolation and questions the purpose of his existence. Patient denies homicidal or suicidal ideation. Patient continues to use Klonopin as needed for anxiety.   Review of Systems  Constitutional: Positive for activity change.  Genitourinary: Positive for urgency, frequency and decreased urine volume.  Neurological: Positive for dizziness.  Psychiatric/Behavioral: Negative for suicidal ideas.      Please see HPI.  Objective:   Physical Exam  Constitutional: He is oriented to person, place, and time. He appears well-developed and well-nourished. No distress.  HENT:  Head: Normocephalic and atraumatic.  Nose: Nose normal.  Mouth/Throat: Oropharynx is clear and moist.  Patient's tympanic membranes are scarred bilaterally. Consistent with tympanostomy tubes.   Eyes: Conjunctivae and EOM are normal. Right eye exhibits no discharge. Left eye exhibits no discharge.  Pupils are equal and round bilaterally. Patient's right eye is normally reactive to light. Patient's left eye is slow to react to light.   Neck: Normal range of motion. No tracheal deviation present. No thyromegaly present.  Cardiovascular: Normal rate.   No murmur heard. Pulmonary/Chest: Effort normal and breath  sounds normal. No respiratory distress. He has no wheezes. He has no rales.  Abdominal: Soft. He exhibits no distension. There is no tenderness. There is no rebound and no guarding.  Patient has a right inguinal hernia.   Musculoskeletal: Normal range of motion.  Neurological: He is alert and oriented to person, place, and time. No cranial nerve deficit.  Skin: Skin is warm and dry. He is not diaphoretic.  Patient's skin reveals diffuse erythematous macules with superimposed scale and regions of hyperkeratosis.   Psychiatric: He has a normal mood and affect. His behavior is normal. Judgment and thought content normal.      Assessment & Plan:   1. Urinary Frequency/BPH-pt declined DRE today. AUA was 26. PSA ordered to compare with one in 2015. I do think this represents BPH. Pt is convinced it is due to his hernia.Patient will be started with tamsulosin. Follow up in 2 months.   2. Depression Patient's PHQ is scored at 8. Patient will continue to be treated with 80 mg of Fluoxetine daily.   3. Generalized Anxiety Patient's GAD-7 is scored at 7.klonapin refilled. Please see plan of care in 1. Hopefully with improvement of urinary frequency this score will decrease as well.   4. Actinic Keratosis Patient has been started on fluorouracil 5% cream.   5. Chronic Pain/multiple joint pain Patient will continue to be treated with Tylenol 4 and diclofenac. Diclofenac is new. Per pt ibuprofen, aleve, mobic and celebrex have not helped his joint pain.   6. Right inguinal hernia- will make referral for surgeon since he has insurance now. Red flags discussed.

## 2015-11-25 DIAGNOSIS — N400 Enlarged prostate without lower urinary tract symptoms: Secondary | ICD-10-CM | POA: Insufficient documentation

## 2015-11-25 DIAGNOSIS — G8929 Other chronic pain: Secondary | ICD-10-CM | POA: Insufficient documentation

## 2015-11-25 DIAGNOSIS — M2550 Pain in unspecified joint: Secondary | ICD-10-CM

## 2015-11-25 HISTORY — DX: Benign prostatic hyperplasia without lower urinary tract symptoms: N40.0

## 2015-11-25 HISTORY — DX: Other chronic pain: G89.29

## 2015-11-25 LAB — COMPLETE METABOLIC PANEL WITH GFR
ALK PHOS: 60 U/L (ref 40–115)
ALT: 50 U/L — AB (ref 9–46)
AST: 44 U/L — ABNORMAL HIGH (ref 10–35)
Albumin: 4.5 g/dL (ref 3.6–5.1)
BILIRUBIN TOTAL: 0.6 mg/dL (ref 0.2–1.2)
BUN: 10 mg/dL (ref 7–25)
CALCIUM: 9.4 mg/dL (ref 8.6–10.3)
CO2: 27 mmol/L (ref 20–31)
CREATININE: 0.8 mg/dL (ref 0.70–1.25)
Chloride: 99 mmol/L (ref 98–110)
Glucose, Bld: 84 mg/dL (ref 65–99)
Potassium: 4.7 mmol/L (ref 3.5–5.3)
Sodium: 136 mmol/L (ref 135–146)
TOTAL PROTEIN: 7.4 g/dL (ref 6.1–8.1)

## 2015-11-25 LAB — PSA, TOTAL AND FREE
PSA FREE PCT: 25 % (ref >25)
PSA, Free: 0.42 ng/mL
PSA: 1.67 ng/mL (ref <=4.00)

## 2015-11-25 NOTE — Addendum Note (Signed)
Addended by: Donella Stade on: 11/25/2015 08:11 AM   Modules accepted: Orders

## 2015-11-26 LAB — HEPATITIS C RNA QUANTITATIVE

## 2015-11-26 LAB — HEPATITIS PANEL, ACUTE
HCV Ab: REACTIVE — AB
Hep A IgM: NONREACTIVE
Hepatitis B Surface Ag: NEGATIVE

## 2015-12-02 ENCOUNTER — Ambulatory Visit: Payer: Self-pay | Admitting: General Surgery

## 2015-12-02 DIAGNOSIS — K4090 Unilateral inguinal hernia, without obstruction or gangrene, not specified as recurrent: Secondary | ICD-10-CM | POA: Diagnosis not present

## 2015-12-02 DIAGNOSIS — R339 Retention of urine, unspecified: Secondary | ICD-10-CM | POA: Diagnosis not present

## 2015-12-02 NOTE — H&P (Signed)
History of Present Illness Ralene Ok MD; 12/02/2015 2:12 PM) The patient is a 60 year old male who presents with an inguinal hernia. The patient is a 60 year old male who is referred by Iran Planas, PA for evaluation of a right inguinal hernia. The patient states he's had a hernia fixed in the late 90s. Patient is unsure whether or not he hernia was the right side. It appears that it was an open repair.  Patient has had no signs or symptoms of incarceration or strangulation.  Patient also discusses some bladder spasm, urinary retention. Patient has been on Flomax however has had minimal benefit.   Other Problems Elbert Ewings, CMA; 12/02/2015 1:37 PM) Anxiety Disorder Arthritis Depression Hemorrhoids Hepatitis  Past Surgical History Elbert Ewings, CMA; 12/02/2015 1:37 PM) Anal Fissure Repair Colon Polyp Removal - Colonoscopy Hemorrhoidectomy Open Inguinal Hernia Surgery Right. Shoulder Surgery Left.  Diagnostic Studies History Elbert Ewings, [place]; 12/02/2015 1:37 PM) Colonoscopy 1-5 years ago  Allergies Elbert Ewings, CMA; 12/02/2015 1:37 PM) No Known Drug Allergies02/14/2017  Medication History Elbert Ewings, CMA; 12/02/2015 1:39 PM) ClonazePAM (0.5MG  Tablet Disperse, Oral) Active. Voltaren (75MG  Tablet DR, Oral) Active. Fluorouracil (5% Cream, External) Active. FLUoxetine HCl (40MG  Capsule, Oral) Active. Tamsulosin HCl (0.4MG  Capsule, Oral) Active. Vitamin D3 Active. Medications Reconciled  Social History Elbert Ewings, [place]; 12/02/2015 1:37 PM) Alcohol use Moderate alcohol use. Caffeine use Coffee. No drug use Tobacco use Current every day smoker.  Family History Elbert Ewings, [place]; 12/02/2015 1:37 PM) Arthritis Mother. Diabetes Mellitus Mother.    Review of Systems Ralene Ok MD; 12/02/2015 2:10 PM) General Not Present- Fever. HEENT Present- Wears glasses/contact lenses. Not Present- Earache, Hearing Loss, Hoarseness, Nose Bleed, Oral  Ulcers, Ringing in the Ears, Seasonal Allergies, Sinus Pain, Sore Throat, Visual Disturbances and Yellow Eyes. Respiratory Not Present- Cough and Difficulty Breathing. Cardiovascular Not Present- Chest Pain. Gastrointestinal Present- Hemorrhoids. Not Present- Abdominal Pain, Bloating, Bloody Stool, Change in Bowel Habits, Chronic diarrhea, Constipation, Difficulty Swallowing, Excessive gas, Gets full quickly at meals, Indigestion, Nausea, Rectal Pain and Vomiting. Male Genitourinary Present- Frequency and Nocturia. Not Present- Blood in Urine, Change in Urinary Stream, Impotence, Painful Urination, Urgency and Urine Leakage. Musculoskeletal Not Present- Myalgia. Neurological Not Present- Weakness.  Vitals Elbert Ewings CMA; 12/02/2015 1:39 PM) 12/02/2015 1:39 PM Weight: 162 lb Height: 72in Body Surface Area: 1.95 m Body Mass Index: 21.97 kg/m  Temp.: 98.78F(Temporal)  Pulse: 92 (Regular)        Physical Exam Ralene Ok MD; 12/02/2015 2:12 PM) General Mental Status-Alert. General Appearance-Consistent with stated age. Hydration-Well hydrated. Voice-Normal.  Head and Neck Head-normocephalic, atraumatic with no lesions or palpable masses. Trachea-midline.  Eye Eyeball - Bilateral-Extraocular movements intact. Sclera/Conjunctiva - Bilateral-No scleral icterus.  Chest and Lung Exam Chest and lung exam reveals -quiet, even and easy respiratory effort with no use of accessory muscles. Inspection Chest Wall - Normal. Back - normal.  Cardiovascular Cardiovascular examination reveals -normal heart sounds, regular rate and rhythm with no murmurs.  Abdomen Inspection Skin - Scar - no surgical scars. Hernias - Inguinal hernia - Right - Reducible(Direct). Palpation/Percussion Normal exam - Soft, Non Tender, No Rebound tenderness, No Rigidity (guarding) and No hepatosplenomegaly. Auscultation Normal exam - Bowel sounds  normal.  Neurologic Neurologic evaluation reveals -alert and oriented x 3 with no impairment of recent or remote memory. Mental Status-Normal.  Musculoskeletal Normal Exam - Left-Upper Extremity Strength Normal and Lower Extremity Strength Normal. Normal Exam - Right-Upper Extremity Strength Normal, Lower Extremity Weakness.    Assessment &  Plan Ralene Ok MD; 12/02/2015 2:13 PM) RIGHT INGUINAL HERNIA (K40.90) Impression: 60 year old male with a right likely direct inguinal hernia.  1. The patient will like to proceed to the operating room for laparoscopic right inguinal hernia repair with mesh.  2. I discussed with the patient the signs and symptoms of incarceration and strangulation and the need to proceed to the ER should they occur.  3. I discussed with the patient the risks and benefits of the procedure to include but not limited to: Infection, bleeding, damage to surrounding structures, possible need for further surgery, possible nerve pain, and possible recurrence. The patient was understanding and wishes to proceed.

## 2015-12-09 ENCOUNTER — Other Ambulatory Visit: Payer: Self-pay | Admitting: Physician Assistant

## 2015-12-09 DIAGNOSIS — K4090 Unilateral inguinal hernia, without obstruction or gangrene, not specified as recurrent: Secondary | ICD-10-CM

## 2015-12-09 DIAGNOSIS — N400 Enlarged prostate without lower urinary tract symptoms: Secondary | ICD-10-CM

## 2015-12-12 DIAGNOSIS — N3281 Overactive bladder: Secondary | ICD-10-CM | POA: Diagnosis not present

## 2015-12-12 DIAGNOSIS — Z0000 Encounter for general adult medical examination without abnormal findings: Secondary | ICD-10-CM | POA: Diagnosis not present

## 2015-12-12 DIAGNOSIS — R3912 Poor urinary stream: Secondary | ICD-10-CM | POA: Diagnosis not present

## 2015-12-12 DIAGNOSIS — N401 Enlarged prostate with lower urinary tract symptoms: Secondary | ICD-10-CM | POA: Diagnosis not present

## 2015-12-17 ENCOUNTER — Other Ambulatory Visit: Payer: Self-pay | Admitting: Physician Assistant

## 2015-12-18 ENCOUNTER — Telehealth: Payer: Self-pay

## 2015-12-18 DIAGNOSIS — Z23 Encounter for immunization: Secondary | ICD-10-CM

## 2015-12-26 ENCOUNTER — Encounter (HOSPITAL_COMMUNITY)
Admission: RE | Admit: 2015-12-26 | Discharge: 2015-12-26 | Disposition: A | Discharge status: Home / Self Care | Payer: PPO | Source: Ambulatory Visit | Attending: General Surgery | Admitting: General Surgery

## 2015-12-26 ENCOUNTER — Encounter (HOSPITAL_COMMUNITY): Payer: Self-pay

## 2015-12-26 DIAGNOSIS — Z01812 Encounter for preprocedural laboratory examination: Secondary | ICD-10-CM | POA: Diagnosis not present

## 2015-12-26 DIAGNOSIS — K4090 Unilateral inguinal hernia, without obstruction or gangrene, not specified as recurrent: Secondary | ICD-10-CM | POA: Diagnosis not present

## 2015-12-26 HISTORY — DX: Depression, unspecified: F32.A

## 2015-12-26 HISTORY — DX: Major depressive disorder, single episode, unspecified: F32.9

## 2015-12-26 HISTORY — DX: Chronic obstructive pulmonary disease, unspecified: J44.9

## 2015-12-26 HISTORY — DX: Anxiety disorder, unspecified: F41.9

## 2015-12-26 HISTORY — DX: Malignant (primary) neoplasm, unspecified: C80.1

## 2015-12-26 LAB — CBC
HCT: 45.3 % (ref 39.0–52.0)
Hemoglobin: 15.8 g/dL (ref 13.0–17.0)
MCH: 32 pg (ref 26.0–34.0)
MCHC: 34.9 g/dL (ref 30.0–36.0)
MCV: 91.9 fL (ref 78.0–100.0)
PLATELETS: 163 10*3/uL (ref 150–400)
RBC: 4.93 MIL/uL (ref 4.22–5.81)
RDW: 13.4 % (ref 11.5–15.5)
WBC: 6 10*3/uL (ref 4.0–10.5)

## 2015-12-26 LAB — COMPREHENSIVE METABOLIC PANEL
ALT: 83 U/L — ABNORMAL HIGH (ref 17–63)
ANION GAP: 11 (ref 5–15)
AST: 73 U/L — ABNORMAL HIGH (ref 15–41)
Albumin: 4.2 g/dL (ref 3.5–5.0)
Alkaline Phosphatase: 49 U/L (ref 38–126)
BUN: 12 mg/dL (ref 6–20)
CALCIUM: 9.1 mg/dL (ref 8.9–10.3)
CHLORIDE: 102 mmol/L (ref 101–111)
CO2: 23 mmol/L (ref 22–32)
CREATININE: 0.86 mg/dL (ref 0.61–1.24)
Glucose, Bld: 104 mg/dL — ABNORMAL HIGH (ref 65–99)
Potassium: 4.8 mmol/L (ref 3.5–5.1)
SODIUM: 136 mmol/L (ref 135–145)
Total Bilirubin: 0.5 mg/dL (ref 0.3–1.2)
Total Protein: 6.9 g/dL (ref 6.5–8.1)

## 2015-12-26 NOTE — Pre-Procedure Instructions (Addendum)
Edward Cuevas  12/26/2015      [facility] SERVICE - [place], [place] [address] [place] [address] Phone: [phone] Fax: [phone]  [facility] MARKET [place], [place] [address] Phone: [phone] Fax: [phone]  [facility] [address], [place] [address] Phone: [phone] Fax: [phone]    Your procedure is scheduled on Tuesday, March 14  Report to [facility] Admitting at 12:15 A.M.  Call this number if you have problems the morning of surgery:  [phone]              Any questions prior to surgery call [phone] Monday-Friday 8am-4pm   Remember:  Do not eat food or drink liquids after midnight on Monday Mar. 13   Take these medicines the morning of surgery with A SIP OF WATER: tylenol #4 if needed,clonazepam (klonopin) if needed, prozac (fluoxetine)             Stop vitamins(D),herbal medications,aspirin, nsaids: advil, motrin, ibuprofen, aleve, BC'S, Goody's,voltaren(diclfenac)     Do not wear jewelry, make-up or nail polish.  Do not wear lotions, powders, or perfumes.  You may wear deodorant.  Do not shave 48 hours prior to surgery.  Men may shave face and neck.  Do not bring valuables to the hospital.  [facility] is not responsible for any belongings or valuables.  Contacts, dentures or bridgework may not be worn into surgery.  Leave your suitcase in the car.  After surgery it may be brought to your room.  For patients admitted to the hospital, discharge time will be determined by your treatment team.  Patients discharged the day of surgery will not be allowed to drive home.   Name and phone number of your driver:   Special instructions:  Special Instructions: [facility] - Preparing for Surgery  Before surgery, you can play an important role.   Because skin is not sterile, your skin needs to be as free of germs as possible.  You can reduce the number of germs on you skin by washing with CHG (chlorahexidine gluconate) soap before surgery.  CHG is an antiseptic cleaner which kills germs and bonds with the skin to continue killing germs even after washing.  Please DO NOT use if you have an allergy to CHG or antibacterial soaps.  If your skin becomes reddened/irritated stop using the CHG and inform your nurse when you arrive at Short Stay.  Do not shave (including legs and underarms) for at least 48 hours prior to the first CHG shower.  You may shave your face.  Please follow these instructions carefully:   1.  Shower with CHG Soap the night before surgery and the morning of Surgery.  2.  If you choose to wash your hair, wash your hair first as usual with your normal shampoo.  3.  After you shampoo, rinse your hair and body thoroughly to remove the Shampoo.  4.  Use CHG as you would any other liquid soap.  You can apply chg directly  to the skin and wash gently with scrungie or a clean washcloth.  5.  Apply the CHG Soap to your body ONLY FROM THE NECK DOWN.  Do not use on open wounds or open sores.  Avoid contact with your eyes ears, mouth and genitals (private parts).  Wash  genitals (private parts)       with your normal soap.  6.  Wash thoroughly, paying special attention to the area where your surgery will be performed.  7.  Thoroughly rinse your body with warm water from the neck down.  8.  DO NOT shower/wash with your normal soap after using and rinsing off the CHG Soap.  9.  Pat yourself dry with a clean towel.            10.  Wear clean pajamas.            11.  Place clean sheets on your bed the night of your first shower and do not sleep with pets.  Day of Surgery  Do not apply any lotions/deodorants the morning of surgery.  Please wear clean clothes to the hospital/surgery center.  Please read over the following fact sheets  that you were given. Pain Booklet, Coughing and Deep Breathing and Surgical Site Infection Prevention

## 2015-12-29 MED ORDER — VANCOMYCIN HCL IN DEXTROSE 1-5 GM/200ML-% IV SOLN
1000.0000 mg | INTRAVENOUS | Status: AC
Start: 2015-12-30 — End: 2015-12-30
  Administered 2015-12-30: 1000 mg via INTRAVENOUS
  Filled 2015-12-29: qty 200

## 2015-12-30 ENCOUNTER — Encounter (HOSPITAL_COMMUNITY)
Admission: RE | Disposition: A | Discharge status: Home / Self Care | Payer: Self-pay | Source: Ambulatory Visit | Attending: General Surgery

## 2015-12-30 ENCOUNTER — Ambulatory Visit (HOSPITAL_COMMUNITY): Payer: PPO | Admitting: Anesthesiology

## 2015-12-30 ENCOUNTER — Encounter (HOSPITAL_COMMUNITY): Payer: Self-pay | Admitting: *Deleted

## 2015-12-30 ENCOUNTER — Ambulatory Visit (HOSPITAL_COMMUNITY)
Admission: RE | Admit: 2015-12-30 | Discharge: 2015-12-30 | Disposition: A | Discharge status: Home / Self Care | Payer: PPO | Source: Ambulatory Visit | Attending: General Surgery | Admitting: General Surgery

## 2015-12-30 DIAGNOSIS — K4090 Unilateral inguinal hernia, without obstruction or gangrene, not specified as recurrent: Secondary | ICD-10-CM | POA: Insufficient documentation

## 2015-12-30 DIAGNOSIS — J449 Chronic obstructive pulmonary disease, unspecified: Secondary | ICD-10-CM | POA: Diagnosis not present

## 2015-12-30 DIAGNOSIS — I10 Essential (primary) hypertension: Secondary | ICD-10-CM | POA: Diagnosis not present

## 2015-12-30 DIAGNOSIS — F329 Major depressive disorder, single episode, unspecified: Secondary | ICD-10-CM | POA: Insufficient documentation

## 2015-12-30 DIAGNOSIS — F17200 Nicotine dependence, unspecified, uncomplicated: Secondary | ICD-10-CM | POA: Insufficient documentation

## 2015-12-30 DIAGNOSIS — F419 Anxiety disorder, unspecified: Secondary | ICD-10-CM | POA: Insufficient documentation

## 2015-12-30 DIAGNOSIS — Z79899 Other long term (current) drug therapy: Secondary | ICD-10-CM | POA: Diagnosis not present

## 2015-12-30 HISTORY — PX: INGUINAL HERNIA REPAIR: SHX194

## 2015-12-30 HISTORY — PX: INSERTION OF MESH: SHX5868

## 2015-12-30 SURGERY — REPAIR, HERNIA, INGUINAL, LAPAROSCOPIC
Anesthesia: General | Site: Groin | Laterality: Right

## 2015-12-30 MED ORDER — LABETALOL HCL 5 MG/ML IV SOLN
INTRAVENOUS | Status: DC | PRN
  Administered 2015-12-30: 2.5 mg via INTRAVENOUS

## 2015-12-30 MED ORDER — SODIUM CHLORIDE 0.9% FLUSH: 3.0000 mL | INTRAVENOUS | Status: DC | PRN

## 2015-12-30 MED ORDER — PROMETHAZINE HCL 25 MG/ML IJ SOLN: 6.2500 mg | INTRAMUSCULAR | Status: DC | PRN

## 2015-12-30 MED ORDER — EPHEDRINE SULFATE 50 MG/ML IJ SOLN
INTRAMUSCULAR | Status: AC
  Filled 2015-12-30: qty 1

## 2015-12-30 MED ORDER — EPHEDRINE SULFATE 50 MG/ML IJ SOLN
INTRAMUSCULAR | Status: DC | PRN
  Administered 2015-12-30 (×3): 5 mg via INTRAVENOUS
  Administered 2015-12-30: 10 mg via INTRAVENOUS

## 2015-12-30 MED ORDER — OXYCODONE-ACETAMINOPHEN 5-325 MG PO TABS: 1.0000 | ORAL_TABLET | ORAL | Status: DC | PRN

## 2015-12-30 MED ORDER — SODIUM CHLORIDE 0.9 % IV SOLN: 250.0000 mL | INTRAVENOUS | Status: DC | PRN

## 2015-12-30 MED ORDER — SUGAMMADEX SODIUM 200 MG/2ML IV SOLN
INTRAVENOUS | Status: DC | PRN
  Administered 2015-12-30: 200 mg via INTRAVENOUS

## 2015-12-30 MED ORDER — OXYCODONE HCL 5 MG PO TABS: 5.0000 mg | ORAL_TABLET | ORAL | Status: DC | PRN

## 2015-12-30 MED ORDER — ONDANSETRON HCL 4 MG/2ML IJ SOLN
INTRAMUSCULAR | Status: AC
  Filled 2015-12-30: qty 2

## 2015-12-30 MED ORDER — VECURONIUM BROMIDE 10 MG IV SOLR
INTRAVENOUS | Status: AC
  Filled 2015-12-30: qty 10

## 2015-12-30 MED ORDER — VECURONIUM BROMIDE 10 MG IV SOLR
INTRAVENOUS | Status: DC | PRN
  Administered 2015-12-30: 2 mg via INTRAVENOUS

## 2015-12-30 MED ORDER — ACETAMINOPHEN 650 MG RE SUPP
650.0000 mg | RECTAL | Status: DC | PRN
  Filled 2015-12-30: qty 1

## 2015-12-30 MED ORDER — CHLORHEXIDINE GLUCONATE 4 % EX LIQD
1.0000 | Freq: Once | CUTANEOUS | Status: DC
Start: 2015-12-31 — End: 2016-01-01

## 2015-12-30 MED ORDER — ROCURONIUM BROMIDE 50 MG/5ML IV SOLN
INTRAVENOUS | Status: AC
  Filled 2015-12-30: qty 1

## 2015-12-30 MED ORDER — KETOROLAC TROMETHAMINE 30 MG/ML IJ SOLN
INTRAMUSCULAR | Status: AC
  Filled 2015-12-30: qty 1

## 2015-12-30 MED ORDER — ACETAMINOPHEN 325 MG PO TABS
650.0000 mg | ORAL_TABLET | ORAL | Status: DC | PRN
  Filled 2015-12-30: qty 2

## 2015-12-30 MED ORDER — FENTANYL CITRATE (PF) 100 MCG/2ML IJ SOLN
INTRAMUSCULAR | Status: DC | PRN
  Administered 2015-12-30: 100 ug via INTRAVENOUS
  Administered 2015-12-30: 50 ug via INTRAVENOUS
  Administered 2015-12-30: 100 ug via INTRAVENOUS

## 2015-12-30 MED ORDER — LABETALOL HCL 5 MG/ML IV SOLN
INTRAVENOUS | Status: AC
  Filled 2015-12-30: qty 4

## 2015-12-30 MED ORDER — LACTATED RINGERS IV SOLN
INTRAVENOUS | Status: DC
  Administered 2015-12-30 (×2): via INTRAVENOUS

## 2015-12-30 MED ORDER — BUPIVACAINE HCL 0.25 % IJ SOLN
INTRAMUSCULAR | Status: DC | PRN
  Administered 2015-12-30: 3 mL

## 2015-12-30 MED ORDER — KETOROLAC TROMETHAMINE 30 MG/ML IJ SOLN
INTRAMUSCULAR | Status: DC | PRN
  Administered 2015-12-30: 30 mg via INTRAVENOUS

## 2015-12-30 MED ORDER — FENTANYL CITRATE (PF) 250 MCG/5ML IJ SOLN
INTRAMUSCULAR | Status: AC
  Filled 2015-12-30: qty 5

## 2015-12-30 MED ORDER — ROCURONIUM BROMIDE 100 MG/10ML IV SOLN
INTRAVENOUS | Status: DC | PRN
  Administered 2015-12-30: 10 mg via INTRAVENOUS
  Administered 2015-12-30: 40 mg via INTRAVENOUS

## 2015-12-30 MED ORDER — MORPHINE SULFATE (PF) 2 MG/ML IV SOLN: 2.0000 mg | INTRAVENOUS | Status: DC | PRN

## 2015-12-30 MED ORDER — LIDOCAINE HCL 4 % MT SOLN
OROMUCOSAL | Status: DC | PRN
  Administered 2015-12-30: 4 mL via TOPICAL

## 2015-12-30 MED ORDER — PROPOFOL 10 MG/ML IV BOLUS
INTRAVENOUS | Status: DC | PRN
  Administered 2015-12-30: 200 mg via INTRAVENOUS

## 2015-12-30 MED ORDER — MIDAZOLAM HCL 5 MG/5ML IJ SOLN
INTRAMUSCULAR | Status: DC | PRN
  Administered 2015-12-30: 2 mg via INTRAVENOUS

## 2015-12-30 MED ORDER — LIDOCAINE HCL (CARDIAC) 20 MG/ML IV SOLN
INTRAVENOUS | Status: AC
  Filled 2015-12-30: qty 5

## 2015-12-30 MED ORDER — STERILE WATER FOR INJECTION IJ SOLN
INTRAMUSCULAR | Status: AC
  Filled 2015-12-30: qty 10

## 2015-12-30 MED ORDER — 0.9 % SODIUM CHLORIDE (POUR BTL) OPTIME
TOPICAL | Status: DC | PRN
  Administered 2015-12-30: 1000 mL

## 2015-12-30 MED ORDER — ONDANSETRON HCL 4 MG/2ML IJ SOLN
INTRAMUSCULAR | Status: DC | PRN
  Administered 2015-12-30: 4 mg via INTRAVENOUS

## 2015-12-30 MED ORDER — MIDAZOLAM HCL 2 MG/2ML IJ SOLN
INTRAMUSCULAR | Status: AC
  Filled 2015-12-30: qty 2

## 2015-12-30 MED ORDER — SODIUM CHLORIDE 0.9% FLUSH: 3.0000 mL | Freq: Two times a day (BID) | INTRAVENOUS | Status: DC

## 2015-12-30 MED ORDER — HYDROMORPHONE HCL 1 MG/ML IJ SOLN: 0.2500 mg | INTRAMUSCULAR | Status: DC | PRN

## 2015-12-30 MED ORDER — BUPIVACAINE HCL (PF) 0.25 % IJ SOLN
INTRAMUSCULAR | Status: AC
  Filled 2015-12-30: qty 30

## 2015-12-30 SURGICAL SUPPLY — 39 items
BENZOIN TINCTURE PRP APPL 2/3 (GAUZE/BANDAGES/DRESSINGS) ×4 IMPLANT
CANISTER SUCTION 2500CC (MISCELLANEOUS) ×4 IMPLANT
CHLORAPREP W/TINT 26ML (MISCELLANEOUS) ×4 IMPLANT
CLOSURE WOUND 1/2 X4 (GAUZE/BANDAGES/DRESSINGS) ×1
COVER SURGICAL LIGHT HANDLE (MISCELLANEOUS) ×4 IMPLANT
ELECT REM PT RETURN 9FT ADLT (ELECTROSURGICAL) ×8
ELECTRODE REM PT RTRN 9FT ADLT (ELECTROSURGICAL) ×4 IMPLANT
GAUZE SPONGE 2X2 8PLY STRL LF (GAUZE/BANDAGES/DRESSINGS) ×2 IMPLANT
GLOVE BIO SURGEON STRL SZ7 (GLOVE) ×4 IMPLANT
GLOVE BIO SURGEON STRL SZ7.5 (GLOVE) ×4 IMPLANT
GLOVE BIOGEL PI IND STRL 6.5 (GLOVE) ×2 IMPLANT
GLOVE BIOGEL PI IND STRL 7.0 (GLOVE) ×4 IMPLANT
GLOVE BIOGEL PI INDICATOR 6.5 (GLOVE) ×2
GLOVE BIOGEL PI INDICATOR 7.0 (GLOVE) ×4
GLOVE SURG SS PI 6.5 STRL IVOR (GLOVE) ×4 IMPLANT
GOWN STRL REUS W/ TWL LRG LVL3 (GOWN DISPOSABLE) ×4 IMPLANT
GOWN STRL REUS W/ TWL XL LVL3 (GOWN DISPOSABLE) ×2 IMPLANT
GOWN STRL REUS W/TWL LRG LVL3 (GOWN DISPOSABLE) ×4
GOWN STRL REUS W/TWL XL LVL3 (GOWN DISPOSABLE) ×2
KIT BASIN OR (CUSTOM PROCEDURE TRAY) ×4 IMPLANT
KIT ROOM TURNOVER OR (KITS) ×4 IMPLANT
MESH 3DMAX 4X6 RT LRG (Mesh General) ×4 IMPLANT
NEEDLE INSUFFLATION 14GA 120MM (NEEDLE) ×4 IMPLANT
NS IRRIG 1000ML POUR BTL (IV SOLUTION) ×4 IMPLANT
PAD ARMBOARD 7.5X6 YLW CONV (MISCELLANEOUS) ×8 IMPLANT
RELOAD STAPLE HERNIA 4.0 BLUE (INSTRUMENTS) ×4 IMPLANT
SCISSORS LAP 5X35 DISP (ENDOMECHANICALS) ×4 IMPLANT
SET TROCAR LAP APPLE-HUNT 5MM (ENDOMECHANICALS) ×4 IMPLANT
SPONGE GAUZE 2X2 STER 10/PKG (GAUZE/BANDAGES/DRESSINGS) ×2
STAPLER HERNIA 12 8.5 360D (INSTRUMENTS) ×4 IMPLANT
STRIP CLOSURE SKIN 1/2X4 (GAUZE/BANDAGES/DRESSINGS) ×3 IMPLANT
SUT MNCRL AB 4-0 PS2 18 (SUTURE) ×4 IMPLANT
SUT VIC AB 1 CT1 27 (SUTURE) ×2
SUT VIC AB 1 CT1 27XBRD ANBCTR (SUTURE) ×2 IMPLANT
TOWEL OR 17X24 6PK STRL BLUE (TOWEL DISPOSABLE) ×4 IMPLANT
TRAY FOLEY CATH 16FR SILVER (SET/KITS/TRAYS/PACK) ×4 IMPLANT
TRAY LAPAROSCOPIC MC (CUSTOM PROCEDURE TRAY) ×4 IMPLANT
TROCAR XCEL 12X100 BLDLESS (ENDOMECHANICALS) ×4 IMPLANT
TUBING INSUFFLATION (TUBING) ×4 IMPLANT

## 2015-12-30 NOTE — Interval H&P Note (Signed)
History and Physical Interval Note:  12/30/2015 12:53 PM  Edward Cuevas  has presented today for surgery, with the diagnosis of Right inguinal hernia   The various methods of treatment have been discussed with the patient and family. After consideration of risks, benefits and other options for treatment, the patient has consented to  Procedure(s): RIGHT INGUINAL HERNIA REPAIR (Right) INSERTION OF MESH (Right) as a surgical intervention .  The patient's history has been reviewed, patient examined, no change in status, stable for surgery.  I have reviewed the patient's chart and labs.  Questions were answered to the patient's satisfaction.     Rosario Jacks., Anne Hahn

## 2015-12-30 NOTE — Op Note (Signed)
12/30/2015  2:19 PM  PATIENT:  Edward Cuevas  60 y.o. male  PRE-OPERATIVE DIAGNOSIS:  Right inguinal hernia   POST-OPERATIVE DIAGNOSIS:  Right indirect inguinal hernia   PROCEDURE:  Procedure(s): RIGHT RIGHT INGUINAL HERNIA REPAIR (Right) INSERTION OF MESH (Right)  SURGEON:  Surgeon(s) and Role:    * Ralene Ok, MD - Primary  ANESTHESIA:   local and general  EBL:  Total I/O In: 1000 [I.V.:1000] Out: 170 [Urine:150; Blood:<10]  BLOOD ADMINISTERED:none  DRAINS: none   LOCAL MEDICATIONS USED:  BUPIVICAINE   SPECIMEN:  No Specimen  DISPOSITION OF SPECIMEN:  N/A  COUNTS:  YES  TOURNIQUET:  * No tourniquets in log *  DICTATION: .Dragon Dictation   Counts: reported as correct x 2  Findings:  The patient had a moderated right indirect hernia  Indications for procedure:  The patient is a 60 year old male with a right hernia for several months. Patient complained of symptomatology to his right inguinal area. The patient was taken back for elective inguinal hernia repair.  Details of the procedure: The patient was taken back to the operating room. The patient was placed in supine position with bilateral SCDs in place.  The patient was prepped and draped in the usual sterile fashion.  After appropriate anitbiotics were confirmed, a time-out was confirmed and all facts were verified.  0.25% Marcaine was used to infiltrate the umbilical area. A 11-blade was used to cut down the skin and blunt dissection was used to get the anterior fashion.  The anterior fascia was incised approximately 1 cm and the muscles were retracted laterally. Blunt dissection was then used to create a space in the preperitoneal area. At this time a 10 mm camera was then introduced into the space and advanced the pubic tubercle and a 12 mm trocar was placed over this and insufflation was started.  At this time and space was created from medial to laterally the preperitoneal space.  Cooper's ligament was  initially cleaned off.  The hernia sac was identified in the indirect space. Dissection of the hernia sac was undertaken the vas deferens was identified and protected in all parts of the case.    Once the hernia sac was taken down to approximately the umbilicus a Bard 3D Max mesh, size: Large, was  introduced into the preperitoneal space.  The mesh was brought over to cover the direct and indirect hernia spaces.  This was anchored into place and secured to Cooper's ligament with 4.39mm staples from a Coviden hernia stapler. It was anchored to the anterior abdominal wall with 4.8 mm staples. The hernia sac was seen lying posterior to the mesh. There was no staples placed laterally. The insufflation was evacuated and the peritoneum was seen posterior to the mesh. The trochars were removed. The anterior fascia was reapproximated using #1 Vicryl on a UR- 6.  Intra-abdominal air was evacuated and the Veress needle removed. The skin was reapproximated using 4-0 Monocryl subcuticular fashion the patient was awakened from general anesthesia and taken to recovery in stable condition.   PLAN OF CARE: Discharge to home after PACU  PATIENT DISPOSITION:  PACU - hemodynamically stable.   Delay start of Pharmacological VTE agent (>24hrs) due to surgical blood loss or risk of bleeding: not applicable

## 2015-12-30 NOTE — Anesthesia Postprocedure Evaluation (Signed)
Anesthesia Post Note  Patient: Edward Cuevas  Procedure(s) Performed: Procedure(s) (LRB): RIGHT RIGHT INGUINAL HERNIA REPAIR (Right) INSERTION OF MESH (Right)  Patient location during evaluation: PACU Anesthesia Type: General Level of consciousness: awake and alert Pain management: pain level controlled Vital Signs Assessment: post-procedure vital signs reviewed and stable Respiratory status: spontaneous breathing, nonlabored ventilation, respiratory function stable and patient connected to nasal cannula oxygen Cardiovascular status: blood pressure returned to baseline and stable Postop Assessment: no signs of nausea or vomiting Anesthetic complications: no    Last Vitals:  Filed Vitals:   12/30/15 1445 12/30/15 1500  BP: 117/78   Pulse: 67 65  Temp:    Resp: 15 15    Last Pain:  Filed Vitals:   12/30/15 1503  PainSc: 3                  Alexis Frock

## 2015-12-30 NOTE — Anesthesia Procedure Notes (Signed)
Procedure Name: Intubation Date/Time: 12/30/2015 1:25 PM Performed by: Layla Maw Pre-anesthesia Checklist: Patient identified, Patient being monitored, Timeout performed, Emergency Drugs available and Suction available Patient Re-evaluated:Patient Re-evaluated prior to inductionOxygen Delivery Method: Circle System Utilized Preoxygenation: Pre-oxygenation with 100% oxygen Intubation Type: IV induction Ventilation: Mask ventilation without difficulty Laryngoscope Size: Miller and 3 Grade View: Grade I Tube type: Oral Tube size: 8.0 mm Number of attempts: 1 Airway Equipment and Method: Stylet Placement Confirmation: ETT inserted through vocal cords under direct vision,  positive ETCO2 and breath sounds checked- equal and bilateral Secured at: 25 cm Tube secured with: Tape Dental Injury: Teeth and Oropharynx as per pre-operative assessment

## 2015-12-30 NOTE — Anesthesia Preprocedure Evaluation (Addendum)
Anesthesia Evaluation  Patient identified by MRN, date of birth, ID band Patient awake    Reviewed: Allergy & Precautions, H&P , NPO status , Patient's Chart, lab work & pertinent test results  History of Anesthesia Complications Negative for: history of anesthetic complications  Airway Mallampati: II  TM Distance: >3 FB Neck ROM: full    Dental no notable dental hx.    Pulmonary COPD, Current Smoker,    Pulmonary exam normal breath sounds clear to auscultation       Cardiovascular hypertension, Pt. on medications Normal cardiovascular exam Rhythm:regular Rate:Normal     Neuro/Psych negative neurological ROS     GI/Hepatic negative GI ROS, (+) Hepatitis -  Endo/Other  negative endocrine ROS  Renal/GU negative Renal ROS     Musculoskeletal  (+) Arthritis ,   Abdominal   Peds  Hematology negative hematology ROS (+)   Anesthesia Other Findings   Reproductive/Obstetrics negative OB ROS                            Anesthesia Physical Anesthesia Plan  ASA: II  Anesthesia Plan: General   Post-op Pain Management:    Induction: Intravenous  Airway Management Planned: Oral ETT  Additional Equipment:   Intra-op Plan:   Post-operative Plan: Extubation in OR  Informed Consent: I have reviewed the patients History and Physical, chart, labs and discussed the procedure including the risks, benefits and alternatives for the proposed anesthesia with the patient or authorized representative who has indicated his/her understanding and acceptance.   Dental Advisory Given  Plan Discussed with: Anesthesiologist, CRNA and Surgeon  Anesthesia Plan Comments:         Anesthesia Quick Evaluation

## 2015-12-30 NOTE — H&P (View-Only) (Signed)
History of Present Illness Ralene Ok MD; 12/02/2015 2:12 PM) The patient is a 60 year old male who presents with an inguinal hernia. The patient is a 60 year old male who is referred by Iran Planas, PA for evaluation of a right inguinal hernia. The patient states he's had a hernia fixed in the late 90s. Patient is unsure whether or not he hernia was the right side. It appears that it was an open repair.  Patient has had no signs or symptoms of incarceration or strangulation.  Patient also discusses some bladder spasm, urinary retention. Patient has been on Flomax however has had minimal benefit.   Other Problems Elbert Ewings, CMA; 12/02/2015 1:37 PM) Anxiety Disorder Arthritis Depression Hemorrhoids Hepatitis  Past Surgical History Elbert Ewings, CMA; 12/02/2015 1:37 PM) Anal Fissure Repair Colon Polyp Removal - Colonoscopy Hemorrhoidectomy Open Inguinal Hernia Surgery Right. Shoulder Surgery Left.  Diagnostic Studies History Elbert Ewings, [place]; 12/02/2015 1:37 PM) Colonoscopy 1-5 years ago  Allergies Elbert Ewings, CMA; 12/02/2015 1:37 PM) No Known Drug Allergies02/14/2017  Medication History Elbert Ewings, CMA; 12/02/2015 1:39 PM) ClonazePAM (0.5MG  Tablet Disperse, Oral) Active. Voltaren (75MG  Tablet DR, Oral) Active. Fluorouracil (5% Cream, External) Active. FLUoxetine HCl (40MG  Capsule, Oral) Active. Tamsulosin HCl (0.4MG  Capsule, Oral) Active. Vitamin D3 Active. Medications Reconciled  Social History Elbert Ewings, [place]; 12/02/2015 1:37 PM) Alcohol use Moderate alcohol use. Caffeine use Coffee. No drug use Tobacco use Current every day smoker.  Family History Elbert Ewings, [place]; 12/02/2015 1:37 PM) Arthritis Mother. Diabetes Mellitus Mother.    Review of Systems Ralene Ok MD; 12/02/2015 2:10 PM) General Not Present- Fever. HEENT Present- Wears glasses/contact lenses. Not Present- Earache, Hearing Loss, Hoarseness, Nose Bleed, Oral  Ulcers, Ringing in the Ears, Seasonal Allergies, Sinus Pain, Sore Throat, Visual Disturbances and Yellow Eyes. Respiratory Not Present- Cough and Difficulty Breathing. Cardiovascular Not Present- Chest Pain. Gastrointestinal Present- Hemorrhoids. Not Present- Abdominal Pain, Bloating, Bloody Stool, Change in Bowel Habits, Chronic diarrhea, Constipation, Difficulty Swallowing, Excessive gas, Gets full quickly at meals, Indigestion, Nausea, Rectal Pain and Vomiting. Male Genitourinary Present- Frequency and Nocturia. Not Present- Blood in Urine, Change in Urinary Stream, Impotence, Painful Urination, Urgency and Urine Leakage. Musculoskeletal Not Present- Myalgia. Neurological Not Present- Weakness.  Vitals Elbert Ewings CMA; 12/02/2015 1:39 PM) 12/02/2015 1:39 PM Weight: 162 lb Height: 72in Body Surface Area: 1.95 m Body Mass Index: 21.97 kg/m  Temp.: 98.37F(Temporal)  Pulse: 92 (Regular)        Physical Exam Ralene Ok MD; 12/02/2015 2:12 PM) General Mental Status-Alert. General Appearance-Consistent with stated age. Hydration-Well hydrated. Voice-Normal.  Head and Neck Head-normocephalic, atraumatic with no lesions or palpable masses. Trachea-midline.  Eye Eyeball - Bilateral-Extraocular movements intact. Sclera/Conjunctiva - Bilateral-No scleral icterus.  Chest and Lung Exam Chest and lung exam reveals -quiet, even and easy respiratory effort with no use of accessory muscles. Inspection Chest Wall - Normal. Back - normal.  Cardiovascular Cardiovascular examination reveals -normal heart sounds, regular rate and rhythm with no murmurs.  Abdomen Inspection Skin - Scar - no surgical scars. Hernias - Inguinal hernia - Right - Reducible(Direct). Palpation/Percussion Normal exam - Soft, Non Tender, No Rebound tenderness, No Rigidity (guarding) and No hepatosplenomegaly. Auscultation Normal exam - Bowel sounds  normal.  Neurologic Neurologic evaluation reveals -alert and oriented x 3 with no impairment of recent or remote memory. Mental Status-Normal.  Musculoskeletal Normal Exam - Left-Upper Extremity Strength Normal and Lower Extremity Strength Normal. Normal Exam - Right-Upper Extremity Strength Normal, Lower Extremity Weakness.    Assessment &  Plan Ralene Ok MD; 12/02/2015 2:13 PM) RIGHT INGUINAL HERNIA (K40.90) Impression: 60 year old male with a right likely direct inguinal hernia.  1. The patient will like to proceed to the operating room for laparoscopic right inguinal hernia repair with mesh.  2. I discussed with the patient the signs and symptoms of incarceration and strangulation and the need to proceed to the ER should they occur.  3. I discussed with the patient the risks and benefits of the procedure to include but not limited to: Infection, bleeding, damage to surrounding structures, possible need for further surgery, possible nerve pain, and possible recurrence. The patient was understanding and wishes to proceed.

## 2015-12-30 NOTE — Discharge Instructions (Signed)
[facility] _______[facility] ° °INGUINAL HERNIA REPAIR: POST OP INSTRUCTIONS ° °Always review your discharge instruction sheet given to you by the facility where your surgery was performed. °IF YOU HAVE DISABILITY OR FAMILY LEAVE FORMS, YOU MUST BRING THEM TO THE OFFICE FOR PROCESSING.   °DO NOT GIVE THEM TO YOUR DOCTOR. ° °1. A  prescription for pain medication may be given to you upon discharge.  Take your pain medication as prescribed, if needed.  If narcotic pain medicine is not needed, then you may take acetaminophen (Tylenol) or ibuprofen (Advil) as needed. °2. Take your usually prescribed medications unless otherwise directed. °3. If you need a refill on your pain medication, please contact your pharmacy.  They will contact our office to request authorization. Prescriptions will not be filled after 5 pm or on week-ends. °4. You should follow a light diet the first 24 hours after arrival home, such as soup and crackers, etc.  Be sure to include lots of fluids daily.  Resume your normal diet the day after surgery. °5. Most patients will experience some swelling and bruising around the umbilicus or in the groin and scrotum.  Ice packs and reclining will help.  Swelling and bruising can take several days to resolve.  °6. It is common to experience some constipation if taking pain medication after surgery.  Increasing fluid intake and taking a stool softener (such as Colace) will usually help or prevent this problem from occurring.  A mild laxative (Milk of Magnesia or Miralax) should be taken according to package directions if there are no bowel movements after 48 hours. °7. Unless discharge instructions indicate otherwise, you may remove your bandages 24-48 hours after surgery, and you may shower at that time.  You may have steri-strips (small skin tapes) in place directly over the incision.  These strips should be left on the skin for 7-10 days.  If your surgeon used skin glue on the incision, you  may shower in 24 hours.  The glue will flake off over the next 2-3 weeks.  Any sutures or staples will be removed at the office during your follow-up visit. °8. ACTIVITIES:  You may resume regular (light) daily activities beginning the next day--such as daily self-care, walking, climbing stairs--gradually increasing activities as tolerated.  You may have sexual intercourse when it is comfortable.  Refrain from any heavy lifting or straining until approved by your doctor. °a. You may drive when you are no longer taking prescription pain medication, you can comfortably wear a seatbelt, and you can safely maneuver your car and apply brakes. °b. RETURN TO WORK:  __________________________________________________________ °9. You should see your doctor in the office for a follow-up appointment approximately 2-3 weeks after your surgery.  Make sure that you call for this appointment within a day or two after you arrive home to insure a convenient appointment time. °10. OTHER INSTRUCTIONS:  __________________________________________________________________________________________________________________________________________________________________________________________  °WHEN TO CALL YOUR DOCTOR: °1. Fever over 101.0 °2. Inability to urinate °3. Nausea and/or vomiting °4. Extreme swelling or bruising °5. Continued bleeding from incision. °6. Increased pain, redness, or drainage from the incision ° °The clinic staff is available to answer your questions during regular business hours.  Please don’t hesitate to call and ask to speak to one of the nurses for clinical concerns.  If you have a medical emergency, go to the nearest emergency room or call 911.  A surgeon from [facility] is always on call at the hospital ° ° °[address], [address], [place], [place]  [address] ? ° [address] [address], [place], [place]   [address] °[phone] ? [phone] ? FAX [phone] °Web site:  [email or web address] ° °

## 2015-12-30 NOTE — Transfer of Care (Signed)
Immediate Anesthesia Transfer of Care Note  Patient: Edward Cuevas  Procedure(s) Performed: Procedure(s): RIGHT RIGHT INGUINAL HERNIA REPAIR (Right) INSERTION OF MESH (Right)  Patient Location: PACU  Anesthesia Type:General  Level of Consciousness: awake, alert , oriented and patient cooperative  Airway & Oxygen Therapy: Patient Spontanous Breathing and Patient connected to nasal cannula oxygen  Post-op Assessment: Report given to RN, Post -op Vital signs reviewed and stable and Patient moving all extremities X 4  Post vital signs: Reviewed and stable  Last Vitals:  Filed Vitals:   12/30/15 1155 12/30/15 1430  BP: 147/78 133/52  Pulse: 69 73  Temp: 36.7 C 36.5 C  Resp: 20 17    Complications: No apparent anesthesia complications

## 2015-12-31 ENCOUNTER — Encounter (HOSPITAL_COMMUNITY): Payer: Self-pay | Admitting: General Surgery

## 2016-01-02 ENCOUNTER — Telehealth: Payer: Self-pay

## 2016-01-02 NOTE — Telephone Encounter (Signed)
Ok to refill early this time. He needs to take as directed for following months. 1 tablet twice a day. If he feels like he needs to take more often he needs to cut tablet in half and take 1/2 tablets more frequently.

## 2016-01-05 NOTE — Telephone Encounter (Signed)
Pharmacy and patient advised

## 2016-01-06 ENCOUNTER — Telehealth: Payer: Self-pay | Admitting: *Deleted

## 2016-01-06 DIAGNOSIS — Z8619 Personal history of other infectious and parasitic diseases: Secondary | ICD-10-CM | POA: Diagnosis not present

## 2016-01-06 DIAGNOSIS — Z23 Encounter for immunization: Secondary | ICD-10-CM | POA: Diagnosis not present

## 2016-01-06 NOTE — Telephone Encounter (Signed)
Pt stopped by the office to ask about his early refill of clonazepam.  He told me that he thought you mentioned increasing the dose at his last visit.  Last several rx's have been for 0.5mg .  He stated that he has been taking 1.5 tabs bid.

## 2016-01-07 NOTE — Telephone Encounter (Signed)
I am giving you the .5mg  so that you can even decrease from there. I would like to see you at .5mg  twice a day. To equal no more than 60 tabs a month.  Go ahead and write new rx for .5mg  1-2 tablets twice a day as needed but only give him #75 with 1 refill. After 2 months I want to see you at goal.   Daily medicines can help with this taper down. Are you interested in trying other daily medications?

## 2016-01-08 LAB — HEPATITIS B CORE ANTIBODY, IGM: Hep B C IgM: NONREACTIVE

## 2016-01-08 LAB — HEPATITIS C RNA QUANTITATIVE
HCV QUANT LOG: 6.46 {Log} — AB (ref <1.18)
HCV Quantitative: 2872536 IU/mL — ABNORMAL HIGH (ref <15)

## 2016-01-21 NOTE — Telephone Encounter (Signed)
Call pt: viral load is still elevated for hep C. Did you ever complete treatment when first dx 1 year ago?

## 2016-01-23 NOTE — Telephone Encounter (Signed)
Pt notified about the Hep C. He did finish the treatments

## 2016-03-02 ENCOUNTER — Other Ambulatory Visit: Payer: Self-pay | Admitting: Physician Assistant

## 2016-03-03 ENCOUNTER — Other Ambulatory Visit: Payer: Self-pay | Admitting: Physician Assistant

## 2016-04-02 ENCOUNTER — Other Ambulatory Visit: Payer: Self-pay | Admitting: Physician Assistant

## 2016-04-09 ENCOUNTER — Ambulatory Visit (INDEPENDENT_AMBULATORY_CARE_PROVIDER_SITE_OTHER): Payer: PPO | Admitting: Physician Assistant

## 2016-04-09 ENCOUNTER — Encounter: Payer: Self-pay | Admitting: Physician Assistant

## 2016-04-09 VITALS — BP 134/73 | HR 73 | Ht 72.0 in | Wt 161.0 lb

## 2016-04-09 DIAGNOSIS — M5137 Other intervertebral disc degeneration, lumbosacral region: Secondary | ICD-10-CM | POA: Diagnosis not present

## 2016-04-09 DIAGNOSIS — F410 Panic disorder [episodic paroxysmal anxiety] without agoraphobia: Secondary | ICD-10-CM | POA: Diagnosis not present

## 2016-04-09 DIAGNOSIS — M51379 Other intervertebral disc degeneration, lumbosacral region without mention of lumbar back pain or lower extremity pain: Secondary | ICD-10-CM

## 2016-04-09 DIAGNOSIS — I10 Essential (primary) hypertension: Secondary | ICD-10-CM

## 2016-04-09 DIAGNOSIS — F411 Generalized anxiety disorder: Secondary | ICD-10-CM | POA: Diagnosis not present

## 2016-04-09 MED ORDER — CLONAZEPAM 0.5 MG PO TABS: ORAL_TABLET | ORAL | Status: DC

## 2016-04-09 MED ORDER — ACETAMINOPHEN-CODEINE #4 300-60 MG PO TABS: ORAL_TABLET | ORAL | Status: DC

## 2016-04-09 MED ORDER — DICLOFENAC SODIUM 75 MG PO TBEC: 75.0000 mg | DELAYED_RELEASE_TABLET | Freq: Two times a day (BID) | ORAL | Status: DC

## 2016-04-09 MED ORDER — FLUOXETINE HCL 40 MG PO CAPS: 40.0000 mg | ORAL_CAPSULE | Freq: Two times a day (BID) | ORAL | Status: DC

## 2016-04-12 NOTE — Progress Notes (Signed)
   Subjective:    Patient ID: Edward Cuevas, male    DOB: 1956-02-03, 60 y.o.   MRN: [id number]  HPI Pt is a 60 yo male who presents to the clinic for follow up.   He is doing much better with anxiety and panic attacks. He is taking prozac and klonapin. Not had any panic attacks. Denies any suicidial thoughts.   He needs refill on pain medication due to Lumbar DDD. He is also taking diclofenac for pain. Working well.      Review of Systems  All other systems reviewed and are negative.      Objective:   Physical Exam  Constitutional: He is oriented to person, place, and time. He appears well-developed and well-nourished.  HENT:  Head: Normocephalic and atraumatic.  Cardiovascular: Normal rate, regular rhythm and normal heart sounds.   Pulmonary/Chest: Effort normal and breath sounds normal.  Neurological: He is alert and oriented to person, place, and time.  Skin:  Bilateral scaly macules and papules with erythematous base over bilateral arms and hands.   Psychiatric: He has a normal mood and affect. His behavior is normal.          Assessment & Plan:  HTN- controlled with no meds.   GAD/panic attacks- GAD-7 was 5, not hard to deal with as long as has his medications. Refilled prozac and klonapin.   Lumbar DDD-tylenol with codeine post dated for 3 months. Discussed to only use as needed.  Diclofenac refilled for bid usage.   Actinic keratosis- suspect that some have developed into squamous cell carcinoma. Continue using fluorouracil cream. Discussed cryotherapy, pt declined. Needs dermatology referral. He wants to hold off until he pays off hernia repair.

## 2016-07-17 ENCOUNTER — Other Ambulatory Visit: Payer: Self-pay | Admitting: Physician Assistant

## 2016-08-23 ENCOUNTER — Other Ambulatory Visit: Payer: Self-pay | Admitting: Physician Assistant

## 2016-08-24 ENCOUNTER — Encounter: Payer: Self-pay | Admitting: Physician Assistant

## 2016-08-24 ENCOUNTER — Ambulatory Visit (INDEPENDENT_AMBULATORY_CARE_PROVIDER_SITE_OTHER): Payer: PPO | Admitting: Physician Assistant

## 2016-08-24 VITALS — BP 137/69 | HR 82 | Ht 72.0 in | Wt 155.0 lb

## 2016-08-24 DIAGNOSIS — I10 Essential (primary) hypertension: Secondary | ICD-10-CM | POA: Diagnosis not present

## 2016-08-24 DIAGNOSIS — Z8619 Personal history of other infectious and parasitic diseases: Secondary | ICD-10-CM

## 2016-08-24 DIAGNOSIS — F411 Generalized anxiety disorder: Secondary | ICD-10-CM

## 2016-08-24 DIAGNOSIS — G8929 Other chronic pain: Secondary | ICD-10-CM

## 2016-08-24 DIAGNOSIS — F410 Panic disorder [episodic paroxysmal anxiety] without agoraphobia: Secondary | ICD-10-CM | POA: Diagnosis not present

## 2016-08-24 DIAGNOSIS — Z79899 Other long term (current) drug therapy: Secondary | ICD-10-CM | POA: Diagnosis not present

## 2016-08-24 DIAGNOSIS — Z13220 Encounter for screening for lipoid disorders: Secondary | ICD-10-CM | POA: Diagnosis not present

## 2016-08-24 DIAGNOSIS — M2550 Pain in unspecified joint: Secondary | ICD-10-CM

## 2016-08-24 MED ORDER — ACETAMINOPHEN-CODEINE #4 300-60 MG PO TABS: ORAL_TABLET | ORAL | 0 refills | Status: DC

## 2016-08-24 MED ORDER — FLUOXETINE HCL 40 MG PO CAPS: 40.0000 mg | ORAL_CAPSULE | Freq: Two times a day (BID) | ORAL | 5 refills | Status: DC

## 2016-08-24 MED ORDER — DICLOFENAC SODIUM 75 MG PO TBEC: 75.0000 mg | DELAYED_RELEASE_TABLET | Freq: Two times a day (BID) | ORAL | 5 refills | Status: DC

## 2016-08-24 MED ORDER — CLONAZEPAM 0.5 MG PO TABS: ORAL_TABLET | ORAL | 5 refills | Status: DC

## 2016-08-24 NOTE — Telephone Encounter (Signed)
Pt seen today. Ok to send.

## 2016-08-24 NOTE — Telephone Encounter (Signed)
This refill appropriate?

## 2016-08-24 NOTE — Progress Notes (Signed)
   Subjective:    Patient ID: Edward Cuevas, male    DOB: 1956-09-19, 60 y.o.   MRN: [id number]  HPI  Pt is a 60 yo male who presents to the clinic to follow up on medications.  He now has insurance and can afford to go to GI for hepatitis history. Reports having 12 weeks of treatment. Liver biopsy never done. Hx of liver enzymes elevated in labs.   He is doing ok with anxiety and panic on prozac and klonapin.   He continues to have chronic pain. Taking diclofenac and tylenol 4.   Denies any CP, palptiations, headaches or dizziness.     Review of Systems  All other systems reviewed and are negative.      Objective:   Physical Exam  Constitutional: He is oriented to person, place, and time. He appears well-developed and well-nourished.  HENT:  Head: Normocephalic and atraumatic.  Cardiovascular: Normal rate, regular rhythm and normal heart sounds.   Pulmonary/Chest: Effort normal and breath sounds normal.  Neurological: He is alert and oriented to person, place, and time.  Psychiatric: He has a normal mood and affect. His behavior is normal.          Assessment & Plan:  Marland KitchenMarland KitchenDiagnoses and all orders for this visit:  History of hepatitis C -     Hepatitis B Core Antibody, total -     COMPLETE METABOLIC PANEL WITH GFR -     Hepatitis B Surface AntiGEN -     Hepatitis B surface antibody -     Ambulatory referral to Gastroenterology  Screening for lipid disorders -     Lipid panel  Hypertension, unspecified type  Panic attacks -     clonazePAM (KLONOPIN) 0.5 MG tablet; TAKE ONE TABLET BY MOUTH 2 TIMES DAILY AS NEEDED FOR ANXIETY -     FLUoxetine (PROZAC) 40 MG capsule; Take 1 capsule (40 mg total) by mouth 2 (two) times daily.  GAD (generalized anxiety disorder) -     FLUoxetine (PROZAC) 40 MG capsule; Take 1 capsule (40 mg total) by mouth 2 (two) times daily.  Chronic pain of multiple joints -     diclofenac (VOLTAREN) 75 MG EC tablet; Take 1 tablet (75 mg total) by  mouth 2 (two) times daily. -     acetaminophen-codeine (TYLENOL #4) 300-60 MG tablet; TAKE 1/2 TO 1 TABLET BY MOUTH THREE TIMES DAILY AS NEEDED FOR SEVERE PAIN   Time for referral to GI for hepatitis C since has insurance. Unclear since RNA for hep C continues to be positive what next step is. He may need liver biopsy.  Will check cmp for liver enzymes today.

## 2016-08-25 LAB — HEPATITIS B CORE ANTIBODY, TOTAL: HEP B C TOTAL AB: REACTIVE — AB

## 2016-08-25 LAB — COMPLETE METABOLIC PANEL WITH GFR
ALT: 37 U/L (ref 9–46)
AST: 40 U/L — AB (ref 10–35)
Albumin: 4.4 g/dL (ref 3.6–5.1)
Alkaline Phosphatase: 46 U/L (ref 40–115)
BILIRUBIN TOTAL: 0.6 mg/dL (ref 0.2–1.2)
BUN: 7 mg/dL (ref 7–25)
CO2: 25 mmol/L (ref 20–31)
CREATININE: 0.81 mg/dL (ref 0.70–1.25)
Calcium: 9.1 mg/dL (ref 8.6–10.3)
Chloride: 102 mmol/L (ref 98–110)
GFR, Est African American: >89 mL/min (ref >=60)
GFR, Est Non African American: >89 mL/min (ref >=60)
GLUCOSE: 109 mg/dL — AB (ref 65–99)
Potassium: 4.7 mmol/L (ref 3.5–5.3)
SODIUM: 136 mmol/L (ref 135–146)
TOTAL PROTEIN: 7 g/dL (ref 6.1–8.1)

## 2016-08-25 LAB — LIPID PANEL
Cholesterol: 166 mg/dL (ref <200)
HDL: 58 mg/dL (ref >40)
LDL CALC: 95 mg/dL
Total CHOL/HDL Ratio: 2.9 Ratio (ref <5.0)
Triglycerides: 63 mg/dL (ref <150)
VLDL: 13 mg/dL (ref <30)

## 2016-08-25 LAB — HEPATITIS B SURFACE ANTIBODY, QUANTITATIVE: Hepatitis B-Post: 8.4 m[IU]/mL

## 2016-08-25 LAB — HEPATITIS B SURFACE ANTIGEN: HEP B S AG: NEGATIVE

## 2016-09-14 NOTE — Progress Notes (Signed)
Call pt: cholesterol looks great.  Liver enzymes improved since last labs.  Fasting glucose a little elevated. Will get a1c at next visit.

## 2016-09-22 DIAGNOSIS — B182 Chronic viral hepatitis C: Secondary | ICD-10-CM | POA: Diagnosis not present

## 2016-09-27 ENCOUNTER — Other Ambulatory Visit: Payer: Self-pay | Admitting: Physician Assistant

## 2016-09-27 DIAGNOSIS — G8929 Other chronic pain: Secondary | ICD-10-CM

## 2016-09-27 DIAGNOSIS — M2550 Pain in unspecified joint: Principal | ICD-10-CM

## 2016-09-28 ENCOUNTER — Other Ambulatory Visit: Payer: Self-pay | Admitting: Physician Assistant

## 2016-09-28 DIAGNOSIS — M2550 Pain in unspecified joint: Principal | ICD-10-CM

## 2016-09-28 DIAGNOSIS — G8929 Other chronic pain: Secondary | ICD-10-CM

## 2016-09-29 DIAGNOSIS — B182 Chronic viral hepatitis C: Secondary | ICD-10-CM | POA: Diagnosis not present

## 2016-09-29 DIAGNOSIS — K760 Fatty (change of) liver, not elsewhere classified: Secondary | ICD-10-CM | POA: Diagnosis not present

## 2016-10-01 ENCOUNTER — Other Ambulatory Visit: Payer: Self-pay | Admitting: Physician Assistant

## 2016-10-01 DIAGNOSIS — K760 Fatty (change of) liver, not elsewhere classified: Secondary | ICD-10-CM | POA: Insufficient documentation

## 2016-10-01 HISTORY — DX: Fatty (change of) liver, not elsewhere classified: K76.0

## 2016-11-05 ENCOUNTER — Other Ambulatory Visit: Payer: Self-pay | Admitting: Physician Assistant

## 2016-11-05 DIAGNOSIS — G8929 Other chronic pain: Secondary | ICD-10-CM

## 2016-11-05 DIAGNOSIS — M2550 Pain in unspecified joint: Principal | ICD-10-CM

## 2016-11-08 ENCOUNTER — Other Ambulatory Visit: Payer: Self-pay

## 2016-11-08 NOTE — Telephone Encounter (Signed)
Ok to fill 

## 2016-11-08 NOTE — Telephone Encounter (Signed)
Ok to refill just seen in 08/2016. 3 month follow ups.

## 2016-11-09 ENCOUNTER — Other Ambulatory Visit: Payer: Self-pay | Admitting: Physician Assistant

## 2016-11-09 DIAGNOSIS — M2550 Pain in unspecified joint: Principal | ICD-10-CM

## 2016-11-09 DIAGNOSIS — G8929 Other chronic pain: Secondary | ICD-10-CM

## 2016-12-15 ENCOUNTER — Other Ambulatory Visit: Payer: Self-pay | Admitting: Physician Assistant

## 2016-12-15 DIAGNOSIS — M2550 Pain in unspecified joint: Principal | ICD-10-CM

## 2016-12-15 DIAGNOSIS — G8929 Other chronic pain: Secondary | ICD-10-CM

## 2016-12-17 ENCOUNTER — Other Ambulatory Visit: Payer: Self-pay

## 2016-12-17 DIAGNOSIS — G8929 Other chronic pain: Secondary | ICD-10-CM

## 2016-12-17 DIAGNOSIS — M2550 Pain in unspecified joint: Principal | ICD-10-CM

## 2016-12-17 MED ORDER — ACETAMINOPHEN-CODEINE #4 300-60 MG PO TABS: 0.5000 | ORAL_TABLET | Freq: Three times a day (TID) | ORAL | 0 refills | Status: DC | PRN

## 2016-12-29 ENCOUNTER — Ambulatory Visit (INDEPENDENT_AMBULATORY_CARE_PROVIDER_SITE_OTHER): Payer: PPO | Admitting: Physician Assistant

## 2016-12-29 ENCOUNTER — Ambulatory Visit (INDEPENDENT_AMBULATORY_CARE_PROVIDER_SITE_OTHER): Payer: PPO

## 2016-12-29 ENCOUNTER — Ambulatory Visit (INDEPENDENT_AMBULATORY_CARE_PROVIDER_SITE_OTHER): Payer: PPO | Admitting: Family Medicine

## 2016-12-29 ENCOUNTER — Encounter: Payer: Self-pay | Admitting: Family Medicine

## 2016-12-29 ENCOUNTER — Encounter: Payer: Self-pay | Admitting: Physician Assistant

## 2016-12-29 VITALS — BP 113/75 | HR 86 | Wt 163.0 lb

## 2016-12-29 DIAGNOSIS — S39012A Strain of muscle, fascia and tendon of lower back, initial encounter: Secondary | ICD-10-CM

## 2016-12-29 DIAGNOSIS — K5903 Drug induced constipation: Secondary | ICD-10-CM

## 2016-12-29 DIAGNOSIS — Z131 Encounter for screening for diabetes mellitus: Secondary | ICD-10-CM

## 2016-12-29 DIAGNOSIS — R350 Frequency of micturition: Secondary | ICD-10-CM

## 2016-12-29 DIAGNOSIS — Z13220 Encounter for screening for lipoid disorders: Secondary | ICD-10-CM

## 2016-12-29 DIAGNOSIS — M5440 Lumbago with sciatica, unspecified side: Secondary | ICD-10-CM | POA: Diagnosis not present

## 2016-12-29 DIAGNOSIS — J449 Chronic obstructive pulmonary disease, unspecified: Secondary | ICD-10-CM | POA: Insufficient documentation

## 2016-12-29 DIAGNOSIS — K649 Unspecified hemorrhoids: Secondary | ICD-10-CM

## 2016-12-29 DIAGNOSIS — J42 Unspecified chronic bronchitis: Secondary | ICD-10-CM

## 2016-12-29 DIAGNOSIS — Z0000 Encounter for general adult medical examination without abnormal findings: Secondary | ICD-10-CM | POA: Diagnosis not present

## 2016-12-29 DIAGNOSIS — M545 Low back pain: Secondary | ICD-10-CM | POA: Diagnosis not present

## 2016-12-29 HISTORY — DX: Chronic obstructive pulmonary disease, unspecified: J44.9

## 2016-12-29 MED ORDER — OXYBUTYNIN CHLORIDE ER 5 MG PO TB24: 5.0000 mg | ORAL_TABLET | Freq: Every day | ORAL | 1 refills | Status: DC

## 2016-12-29 MED ORDER — BACLOFEN 10 MG PO TABS: 10.0000 mg | ORAL_TABLET | Freq: Three times a day (TID) | ORAL | 1 refills | Status: DC | PRN

## 2016-12-29 MED ORDER — POLYETHYLENE GLYCOL 3350 17 G PO PACK: 17.0000 g | PACK | Freq: Two times a day (BID) | ORAL | 2 refills | Status: DC

## 2016-12-29 MED ORDER — HYDROCORTISONE 2.5 % RE CREA: 1.0000 "application " | TOPICAL_CREAM | Freq: Two times a day (BID) | RECTAL | 2 refills | Status: DC

## 2016-12-29 NOTE — Progress Notes (Signed)
Subjective:    Patient ID: Edward Cuevas, male    DOB: Oct 13, 1956, 61 y.o.   MRN: [id number]  HPI  Pt is a 61 yo male who presents to the clinic for CPE.   .. Active Ambulatory Problems    Diagnosis Date Noted  . Hyperlipidemia   . Hypertension   . Prediabetes   . Vitamin D deficiency   . DJD 10/23/2013  . DDD (degenerative disc disease), lumbosacral 10/23/2013  . Encounter for long-term (current) use of other medications 04/08/2014  . Panic attacks 10/04/2014  . History of hepatitis C 10/08/2014  . Right inguinal hernia 10/08/2014  . GAD (generalized anxiety disorder) 11/01/2014  . Olecranon bursitis 01/31/2015  . Actinic keratosis 06/27/2015  . BPH without obstruction/lower urinary tract symptoms 11/25/2015  . Chronic pain of multiple joints 11/25/2015  . Fatty liver 10/01/2016  . Urinary frequency 12/29/2016  . Routine physical examination 12/29/2016  . COPD (chronic obstructive pulmonary disease) ([place]) 12/29/2016   Resolved Ambulatory Problems    Diagnosis Date Noted  . No Resolved Ambulatory Problems   Past Medical History:  Diagnosis Date  . Anxiety   . Arthritis   . Cancer ([place])   . COPD (chronic obstructive pulmonary disease) ([place])   . Depression   . Hepatitis   . Hyperlipidemia   . Hypertension   . Prediabetes   . Vitamin D deficiency    .[name] Family History  Problem Relation Age of Onset  . Diabetes Mother   . Heart disease Father   . Cancer Father   . Heart disease Maternal Grandmother    .[name] Social History   Social History  . Marital status: Single    Spouse name: N/A  . Number of children: N/A  . Years of education: N/A   Occupational History  . Not on file.   Social History Main Topics  . Smoking status: Current Every Day Smoker    Packs/day: 1.50    Years: 3.00    Types: Cigarettes  . Smokeless tobacco: Never Used  . Alcohol use 9.6 oz/week    4 Cans of beer, 12 Standard drinks or equivalent per week     Comment: Beer on occasion   . Drug use: No  . Sexual activity: Not Currently   Other Topics Concern  . Not on file   Social History Narrative  . No narrative on file   He has had some recent worsening low back pain to which he is seeing sports medicine for today.   He does continue to urinate often. It is most bothersome at night. He tried the prostate medication and did not help. He has seen urology before and said his prostate was fine. He thought would improve after hernia surgery. No blood in urine.   He does complian of hemorrhoids. He admits to constipation. He has not tried anything to make better. He takes tylenol with codiene daily and feels like this makes worse but feels like he needs for pain control.      Review of Systems Other than HPI negative review of symptoms.     Objective:   Physical Exam BP 113/75   Pulse 86   Wt 163 lb (73.9 kg)   SpO2 95%   BMI 22.11 kg/m   General Appearance:    Alert, cooperative, no distress, appears stated age  Head:    Normocephalic, without obvious abnormality, atraumatic  Eyes:    PERRL, conjunctiva/corneas clear, EOM's intact, fundi    benign,  both eyes       Ears:    Normal TM's and external ear canals, both ears  Nose:   Nares normal, septum midline, mucosa normal, no drainage    or sinus tenderness  Throat:   Lips, mucosa, and tongue normal; teeth and gums normal  Neck:   Supple, symmetrical, trachea midline, no adenopathy;       thyroid:  No enlargement/tenderness/nodules; no carotid   bruit or JVD  Back:     Symmetric, no curvature, ROM normal, no CVA tenderness  Lungs:     Clear to auscultation bilaterally, respirations unlabored  Chest wall:    No tenderness or deformity  Heart:    Regular rate and rhythm, S1 and S2 normal, no murmur, rub   or gallop  Abdomen:     Soft, non-tender, bowel sounds active all four quadrants,    no masses, no organomegaly  Genitalia:  Pt declined.   Rectal:  Pt declined.  Extremities:   Extremities normal,  atraumatic, no cyanosis or edema  Pulses:   2+ and symmetric all extremities  Skin:   Skin color, texture, turgor normal, no rashes or lesions  Lymph nodes:   Cervical, supraclavicular, and axillary nodes normal  Neurologic:   CNII-XII intact. Normal strength, sensation and reflexes      throughout         Assessment & Plan:  .Marland KitchenJohn was seen today for annual exam.  Diagnoses and all orders for this visit:  Routine physical examination -     Lipid panel -     COMPLETE METABOLIC PANEL WITH GFR -     PSA -     CBC with Differential/Platelet  Urinary frequency -     PSA -     CBC with Differential/Platelet -     oxybutynin (DITROPAN-XL) 5 MG 24 hr tablet; Take 1 tablet (5 mg total) by mouth at bedtime.  Screening for diabetes mellitus -     COMPLETE METABOLIC PANEL WITH GFR  Screening for lipid disorders -     Lipid panel  Chronic bronchitis, unspecified chronic bronchitis type (HCC)  Hemorrhoids, unspecified hemorrhoid type -     hydrocortisone (ANUSOL-HC) 2.5 % rectal cream; Place 1 application rectally 2 (two) times daily.  Drug-induced constipation -     polyethylene glycol (MIRALAX / GLYCOLAX) packet; Take 17 g by mouth 2 (two) times daily. Until stooling regularly     AUA was 24. Will try ditropan for OAB. Pt declined DRE. Will get PSA in blood. He was seen by urology a little over a year ago with normal DRE.   Discussed controlling constipation in order to improve hemorrhoids.   COPD- current smoker. Pulse ox down to 94 percent today. Pt denies any SOB that is bothersome or keeping him from doing what he would like to do. He declines any testing or medication intervention.   Pt has Hep C. Refuses treatment.

## 2016-12-29 NOTE — Patient Instructions (Addendum)
Keeping you healthy  Get these tests  Blood pressure- Have your blood pressure checked once a year by your healthcare provider.  Normal blood pressure is 120/80  Weight- Have your body mass index (BMI) calculated to screen for obesity.  BMI is a measure of body fat based on height and weight. You can also calculate your own BMI at ViewBanking.si.  Cholesterol- Have your cholesterol checked every year.  Diabetes- Have your blood sugar checked regularly if you have high blood pressure, high cholesterol, have a family history of diabetes or if you are overweight.  Screening for Colon Cancer- Colonoscopy starting at age 1.  Screening may begin sooner depending on your family history and other health conditions. Follow up colonoscopy as directed by your Gastroenterologist.  Screening for Prostate Cancer- Both blood work (PSA) and a rectal exam help screen for Prostate Cancer.  Screening begins at age 44 with African-American men and at age 44 with Caucasian men.  Screening may begin sooner depending on your family history.  Take these medicines  Aspirin- One aspirin daily can help prevent Heart disease and Stroke.  Flu shot- Every fall.  Tetanus- Every 10 years.  Zostavax- Once after the age of 40 to prevent Shingles.  Pneumonia shot- Once after the age of 14; if you are younger than 38, ask your healthcare provider if you need a Pneumonia shot.  Take these steps  Don't smoke- If you do smoke, talk to your doctor about quitting.  For tips on how to quit, go to www.smokefree.gov or call 1-800-QUIT-NOW.  Be physically active- Exercise 5 days a week for at least 30 minutes.  If you are not already physically active start slow and gradually work up to 30 minutes of moderate physical activity.  Examples of moderate activity include walking briskly, mowing the yard, dancing, swimming, bicycling, etc.  Eat a healthy diet- Eat a variety of healthy food such as fruits, vegetables, low  fat milk, low fat cheese, yogurt, lean meant, poultry, fish, beans, tofu, etc. For more information go to www.thenutritionsource.org  Drink alcohol in moderation- Limit alcohol intake to less than two drinks a day. Never drink and drive.  Dentist- Brush and floss twice daily; visit your dentist twice a year.  Depression- Your emotional health is as important as your physical health. If you're feeling down, or losing interest in things you would normally enjoy please talk to your healthcare provider.  Eye exam- Visit your eye doctor every year.  Safe sex- If you may be exposed to a sexually transmitted infection, use a condom.  Seat belts- Seat belts can save your life; always wear one.  Smoke/Carbon Monoxide detectors- These detectors need to be installed on the appropriate level of your home.  Replace batteries at least once a year.  Skin cancer- When out in the sun, cover up and use sunscreen 15 SPF or higher.  Violence- If anyone is threatening you, please tell your healthcare provider. Living Will/ Health care power of attorney- Speak with your healthcare provider and family.Keeping you healthy

## 2016-12-29 NOTE — Patient Instructions (Signed)
Thank you for coming in today. Use baclofen for muscle relaxer.  Use a heating pad and TENS unit.  Attend PT if not better.  Recheck with me in 4-6 weeks or sooner if needed.   Come back or go to the emergency room if you notice new weakness new numbness problems walking or bowel or bladder problems.   TENS UNIT: This is helpful for muscle pain and spasm.   Search and Purchase a TENS 7000 2nd edition at  www.tenspros.com or www.Hazel.com It should be less than $30.     TENS unit instructions: Do not shower or bathe with the unit on Turn the unit off before removing electrodes or batteries If the electrodes lose stickiness add a drop of water to the electrodes after they are disconnected from the unit and place on plastic sheet. If you continued to have difficulty, call the TENS unit company to purchase more electrodes. Do not apply lotion on the skin area prior to use. Make sure the skin is clean and dry as this will help prolong the life of the electrodes. After use, always check skin for unusual red areas, rash or other skin difficulties. If there are any skin problems, does not apply electrodes to the same area. Never remove the electrodes from the unit by pulling the wires. Do not use the TENS unit or electrodes other than as directed. Do not change electrode placement without consultating your therapist or physician. Keep 2 fingers with between each electrode. Wear time ratio is 2:1, on to off times.    For example on for 30 minutes off for 15 minutes and then on for 30 minutes off for 15 minutes     Lumbosacral Strain Lumbosacral strain is an injury that causes pain in the lower back (lumbosacral spine). This injury usually occurs from overstretching the muscles or ligaments along your spine. A strain can affect one or more muscles or cord-like tissues that connect bones to other bones (ligaments). What are the causes? This condition may be caused by:  A hard, direct hit  (blow) to the back.  Excessive stretching of the lower back muscles. This may result from:  A fall.  Lifting something heavy.  Repetitive movements such as bending or crouching. What increases the risk? The following factors may increase your risk of getting this condition:  Participating in sports or activities that involve:  A sudden twist of the back.  Pushing or pulling motions.  Being overweight or obese.  Having poor strength and flexibility, especially tight hamstrings or weak muscles in the back or abdomen.  Having too much of a curve in the lower back.  Having a pelvis that is tilted forward. What are the signs or symptoms? The main symptom of this condition is pain in the lower back, at the site of the strain. Pain may extend (radiate) down one or both legs. How is this diagnosed? This condition is diagnosed based on:  Your symptoms.  Your medical history.  A physical exam.  Your health care provider may push on certain areas of your back to determine the source of your pain.  You may be asked to bend forward, backward, and side to side to assess the severity of your pain and your range of motion.  Imaging tests, such as:  X-rays.  MRI. How is this treated? Treatment for this condition may include:  Putting heat and cold on the affected area.  Medicines to help relieve pain and relax your muscles (muscle  relaxants).  NSAIDs to help reduce swelling and discomfort. When your symptoms improve, it is important to gradually return to your normal routine as soon as possible to reduce pain, avoid stiffness, and avoid loss of muscle strength. Generally, symptoms should improve within 6 weeks of treatment. However, recovery time varies. Follow these instructions at home: Managing pain, stiffness, and swelling    If directed, put ice on the injured area during the first 24 hours after your strain.  Put ice in a plastic bag.  Place a towel between your  skin and the bag.  Leave the ice on for 20 minutes, 2-3 times a day.  If directed, put heat on the affected area as often as told by your health care provider. Use the heat source that your health care provider recommends, such as a moist heat pack or a heating pad.  Place a towel between your skin and the heat source.  Leave the heat on for 20-30 minutes.  Remove the heat if your skin turns bright red. This is especially important if you are unable to feel pain, heat, or cold. You may have a greater risk of getting burned. Activity   Rest and return to your normal activities as told by your health care provider. Ask your health care provider what activities are safe for you.  Avoid activities that take a lot of energy for as long as told by your health care provider. General instructions   Take over-the-counter and prescription medicines only as told by your health care provider.  Donot drive or use heavy machinery while taking prescription pain medicine.  Do not use any products that contain nicotine or tobacco, such as cigarettes and e-cigarettes. If you need help quitting, ask your health care provider.  Keep all follow-up visits as told by your health care provider. This is important. How is this prevented?  Use correct form when playing sports and lifting heavy objects.  Use good posture when sitting and standing.  Maintain a healthy weight.  Sleep on a mattress with medium firmness to support your back.  Be safe and responsible while being active to avoid falls.  Do at least 150 minutes of moderate-intensity exercise each week, such as brisk walking or water aerobics. Try a form of exercise that takes stress off your back, such as swimming or stationary cycling.  Maintain physical fitness, including:  Strength.  Flexibility.  Cardiovascular fitness.  Endurance. Contact a health care provider if:  Your back pain does not improve after 6 weeks of  treatment.  Your symptoms get worse. Get help right away if:  Your back pain is severe.  You cannot stand or walk.  You have difficulty controlling when you urinate or when you have a bowel movement.  You feel nauseous or you vomit.  Your feet get very cold.  You have numbness, tingling, weakness, or problems using your arms or legs.  You develop any of the following:  Shortness of breath.  Dizziness.  Pain in your legs.  Weakness in your buttocks or legs.  Discoloration of the skin on your toes or legs. This information is not intended to replace advice given to you by your health care provider. Make sure you discuss any questions you have with your health care provider. Document Released: 07/14/2005 Document Revised: 04/23/2016 Document Reviewed: 03/07/2016 Elsevier Interactive Patient Education  2017 [employer].

## 2016-12-30 NOTE — Progress Notes (Signed)
Edward Cuevas is a 61 y.o. male who presents to [facility] today for back pain. Patient has a two-week history of right-sided lower back pain. He denies any radiating pain weakness or numbness fevers or chills. He denies any bowel or bladder dysfunction. He denies any injury. He notes pain is worse with standing from a seated position and moving around. He's tried some over-the-counter medications which have helped a little. He feels well otherwise.   Past Medical History:  Diagnosis Date  . Anxiety   . Arthritis   . Cancer ([place])    skin  . COPD (chronic obstructive pulmonary disease) ([place])    per pt ?  [name] Depression   . Hepatitis    A,B,C  . Hyperlipidemia   . Hypertension   . Prediabetes   . Vitamin D deficiency    Past Surgical History:  Procedure Laterality Date  . ELBOW SURGERY Right 1986  . [place]  . HERNIA REPAIR Left 1996  . INGUINAL HERNIA REPAIR Right 12/30/2015   Procedure: RIGHT RIGHT INGUINAL HERNIA REPAIR;  Surgeon: Ralene Ok, MD;  Location: [facility];  Service: General;  Laterality: Right;  . INSERTION OF MESH Right 12/30/2015   Procedure: INSERTION OF MESH;  Surgeon: Ralene Ok, MD;  Location: [facility];  Service: General;  Laterality: Right;  . SHOULDER SURGERY Left 2007   Social History  Substance Use Topics  . Smoking status: Current Every Day Smoker    Packs/day: 1.50    Years: 3.00    Types: Cigarettes  . Smokeless tobacco: Never Used  . Alcohol use 9.6 oz/week    4 Cans of beer, 12 Standard drinks or equivalent per week     Comment: Beer on occasion     ROS:  As above   Medications: Current Outpatient Prescriptions  Medication Sig Dispense Refill  . acetaminophen-codeine (TYLENOL #4) 300-60 MG tablet Take 0.5-1 tablets by mouth 3 (three) times daily as needed for moderate pain. 90 tablet 0  . Cholecalciferol (VITAMIN D PO) Take 5,000 Units by mouth daily. Takes 5000  units daily    . clonazePAM (KLONOPIN) 0.5 MG tablet TAKE ONE TABLET BY MOUTH 2 TIMES DAILY AS NEEDED FOR ANXIETY 60 tablet 5  . diclofenac (VOLTAREN) 75 MG EC tablet Take 1 tablet (75 mg total) by mouth 2 (two) times daily. 60 tablet 5  . fluorouracil (EFUDEX) 5 % cream Apply topically 2 (two) times daily. 40 g 1  . FLUoxetine (PROZAC) 40 MG capsule Take 1 capsule (40 mg total) by mouth 2 (two) times daily. 60 capsule 5  . hydrocortisone (ANUSOL-HC) 2.5 % rectal cream Place 1 application rectally 2 (two) times daily. 60 g 2  . oxybutynin (DITROPAN-XL) 5 MG 24 hr tablet Take 1 tablet (5 mg total) by mouth at bedtime. 30 tablet 1  . polyethylene glycol (MIRALAX / GLYCOLAX) packet Take 17 g by mouth 2 (two) times daily. Until stooling regularly 30 packet 2  . baclofen (LIORESAL) 10 MG tablet Take 1 tablet (10 mg total) by mouth 3 (three) times daily as needed for muscle spasms. 45 each 1   No current facility-administered medications for this visit.    Allergies  Allergen Reactions  . Penicillins Itching and Rash    Has patient had a PCN reaction causing immediate rash, facial/tongue/throat swelling, SOB or lightheadedness with hypotension: Yes Has patient had a PCN reaction causing severe rash involving mucus membranes or skin necrosis:  No Has patient had a PCN reaction that required hospitalization No Has patient had a PCN reaction occurring within the last 10 years: No If all of the above answers are "NO", then may proceed with Cephalosporin use.      Exam:  BP 113/75   Pulse 86   Wt 163 lb (73.9 kg)   BMI 22.11 kg/m  General: Well Developed, well nourished, and in no acute distress.  Neuro/Psych: Alert and oriented x3, extra-ocular muscles intact, able to move all 4 extremities, sensation grossly intact. Skin: Warm and dry, no rashes noted.  Respiratory: Not using accessory muscles, speaking in full sentences, trachea midline.  Cardiovascular: Pulses palpable, no extremity  edema. Abdomen: Does not appear distended. MSK: L spine: Nontender to spinal midline. Tender palpation right SI joint region. Low back motion intact to flexion. Patient has diminished extension due to pain. Additionally he has diminished right-sided rotation and right lateral flexion due to pain. Otherwise back motion is normal. Lower extremity strength is equal and normal throughout. Reflexes hyperreflexic right knee normal left knee and ankle and right ankle. Sensation is intact throughout Normal gait.  X-ray L-spine pending   No results found for this or any previous visit (from the past 48 hour(s)). No results found.    Assessment and Plan: 61 y.o. male with lumbago likely due to underlying degenerative disc disease and myofascial strain. X-ray pending. Refer to physical therapy. Recheck in a few weeks.    Orders Placed This Encounter  Procedures  . DG Lumbar Spine Complete    Standing Status:   Future    Number of Occurrences:   1    Standing Expiration Date:   02/28/2018    Order Specific Question:   Reason for Exam (SYMPTOM  OR DIAGNOSIS REQUIRED)    Answer:   eval lspine pain    Order Specific Question:   Preferred imaging location?    Answer:   [facility]  . Ambulatory referral to Physical Therapy    Referral Priority:   Routine    Referral Type:   Physical Medicine    Referral Reason:   Specialty Services Required    Requested Specialty:   Physical Therapy    Number of Visits Requested:   1    Discussed warning signs or symptoms. Please see discharge instructions. Patient expresses understanding.

## 2017-01-17 ENCOUNTER — Other Ambulatory Visit: Payer: Self-pay

## 2017-01-17 ENCOUNTER — Other Ambulatory Visit: Payer: Self-pay | Admitting: Physician Assistant

## 2017-01-17 DIAGNOSIS — M2550 Pain in unspecified joint: Principal | ICD-10-CM

## 2017-01-17 DIAGNOSIS — G8929 Other chronic pain: Secondary | ICD-10-CM

## 2017-01-17 MED ORDER — ACETAMINOPHEN-CODEINE #4 300-60 MG PO TABS: 0.5000 | ORAL_TABLET | Freq: Three times a day (TID) | ORAL | 0 refills | Status: DC | PRN

## 2017-01-18 ENCOUNTER — Encounter: Payer: Self-pay | Admitting: Family Medicine

## 2017-01-18 ENCOUNTER — Ambulatory Visit (INDEPENDENT_AMBULATORY_CARE_PROVIDER_SITE_OTHER): Payer: PPO | Admitting: Family Medicine

## 2017-01-18 DIAGNOSIS — M5416 Radiculopathy, lumbar region: Secondary | ICD-10-CM

## 2017-01-18 HISTORY — DX: Radiculopathy, lumbar region: M54.16

## 2017-01-18 MED ORDER — GABAPENTIN 300 MG PO CAPS: ORAL_CAPSULE | ORAL | 3 refills | Status: DC

## 2017-01-18 NOTE — Progress Notes (Signed)
Edward Cuevas is a 61 y.o. male who presents to [facility] today for back pain radiating to the right leg. Patient has severe right-sided low back pain radiating to the right leg. He was seen about a month ago for right low back pain without radiation. He notes that he has no better. He has been taking baclofen which helps only a little. He notes the pain has worsened and now is radiating. He denies weakness or numbness or bowel bladder dysfunction.  He does note some tingling sensation to the lateral calf. This is also the distribution of the pain.   Past Medical History:  Diagnosis Date  . Anxiety   . Arthritis   . Cancer ([place])    skin  . COPD (chronic obstructive pulmonary disease) ([place])    per pt ?  [name] Depression   . Hepatitis    A,B,C  . Hyperlipidemia   . Hypertension   . Prediabetes   . Vitamin D deficiency    Past Surgical History:  Procedure Laterality Date  . ELBOW SURGERY Right 1986  . [place]  . HERNIA REPAIR Left 1996  . INGUINAL HERNIA REPAIR Right 12/30/2015   Procedure: RIGHT RIGHT INGUINAL HERNIA REPAIR;  Surgeon: Ralene Ok, MD;  Location: [facility];  Service: General;  Laterality: Right;  . INSERTION OF MESH Right 12/30/2015   Procedure: INSERTION OF MESH;  Surgeon: Ralene Ok, MD;  Location: [facility];  Service: General;  Laterality: Right;  . SHOULDER SURGERY Left 2007   Social History  Substance Use Topics  . Smoking status: Current Every Day Smoker    Packs/day: 1.50    Years: 3.00    Types: Cigarettes  . Smokeless tobacco: Never Used  . Alcohol use 9.6 oz/week    4 Cans of beer, 12 Standard drinks or equivalent per week     Comment: Beer on occasion     ROS:  As above   Medications: Current Outpatient Prescriptions  Medication Sig Dispense Refill  . acetaminophen-codeine (TYLENOL #4) 300-60 MG tablet Take 0.5-1 tablets by mouth 3 (three) times daily as needed for moderate  pain. 90 tablet 0  . baclofen (LIORESAL) 10 MG tablet Take 1 tablet (10 mg total) by mouth 3 (three) times daily as needed for muscle spasms. 45 each 1  . Cholecalciferol (VITAMIN D PO) Take 5,000 Units by mouth daily. Takes 5000 units daily    . clonazePAM (KLONOPIN) 0.5 MG tablet TAKE ONE TABLET BY MOUTH 2 TIMES DAILY AS NEEDED FOR ANXIETY 60 tablet 5  . diclofenac (VOLTAREN) 75 MG EC tablet Take 1 tablet (75 mg total) by mouth 2 (two) times daily. 60 tablet 5  . fluorouracil (EFUDEX) 5 % cream Apply topically 2 (two) times daily. 40 g 1  . FLUoxetine (PROZAC) 40 MG capsule Take 1 capsule (40 mg total) by mouth 2 (two) times daily. 60 capsule 5  . hydrocortisone (ANUSOL-HC) 2.5 % rectal cream Place 1 application rectally 2 (two) times daily. 60 g 2  . oxybutynin (DITROPAN-XL) 5 MG 24 hr tablet Take 1 tablet (5 mg total) by mouth at bedtime. 30 tablet 1  . polyethylene glycol (MIRALAX / GLYCOLAX) packet Take 17 g by mouth 2 (two) times daily. Until stooling regularly 30 packet 2  . gabapentin (NEURONTIN) 300 MG capsule One tab PO qHS for a week, then BID for a week, then TID. May double weekly to a max of 3,600mg /day 180 capsule  3   No current facility-administered medications for this visit.    Allergies  Allergen Reactions  . Penicillins Itching and Rash    Has patient had a PCN reaction causing immediate rash, facial/tongue/throat swelling, SOB or lightheadedness with hypotension: Yes Has patient had a PCN reaction causing severe rash involving mucus membranes or skin necrosis: No Has patient had a PCN reaction that required hospitalization No Has patient had a PCN reaction occurring within the last 10 years: No If all of the above answers are "NO", then may proceed with Cephalosporin use.      Exam:  BP 136/70   Pulse 94   Wt 172 lb (78 kg)   BMI 23.33 kg/m  General: Well Developed, well nourished, and in no acute distress.  Neuro/Psych: Alert and oriented x3, extra-ocular  muscles intact, able to move all 4 extremities, sensation grossly intact. Skin: Warm and dry, no rashes noted.  Respiratory: Not using accessory muscles, speaking in full sentences, trachea midline.  Cardiovascular: Pulses palpable, no extremity edema. Abdomen: Does not appear distended. MSK: L spine is nontender. Normal back motion. Lower extreme strength and sensation and reflexes are equal and normal throughout. Positive right-sided slump test. Antalgic gait present.  Study Result   CLINICAL DATA:  Low back pain and sciatica, no known injury, initial encounter  EXAM: LUMBAR SPINE - COMPLETE 4+ VIEW  COMPARISON:  None.  FINDINGS: Five non rib-bearing lumbar vertebra are noted. A transitional segment is noted as well. No pars defects are noted. Mild facet hypertrophic changes are noted. Aortic calcifications are seen without aneurysmal dilatation. Mild endplate osteophytes are noted. No significant disc space narrowing is seen.  IMPRESSION: Mild degenerative change.  Transitional anatomy   Electronically Signed   By: Inez Catalina M.D.   On: 12/30/2016 08:06      No results found for this or any previous visit (from the past 48 hour(s)). No results found.    Assessment and Plan: 61 y.o. male with lumbar radiculopathy. Worsening. At this point patient is failing conservative management. He cannot tolerate oral steroids. Plan for MRI for epidural steroid injection planning. Additionally we'll treat with gabapentin. Recheck following MRI.    Orders Placed This Encounter  Procedures  . MR Lumbar Spine Wo Contrast    Standing Status:   Future    Standing Expiration Date:   03/20/2018    Order Specific Question:   Reason for Exam (SYMPTOM  OR DIAGNOSIS REQUIRED)    Answer:   eval right low back pain with right L5 radiculopathy    Order Specific Question:   What is the patient's sedation requirement?    Answer:   No Sedation    Order Specific Question:   Does  the patient have a pacemaker or implanted devices?    Answer:   No    Order Specific Question:   Preferred imaging location?    Answer:   [facility] (table limit-550lbs)    Order Specific Question:   Radiology Contrast Protocol - do NOT remove file path    Answer:   [email or web address]    Discussed warning signs or symptoms. Please see discharge instructions. Patient expresses understanding.  CC: Iran Planas, PA-C

## 2017-01-18 NOTE — Patient Instructions (Signed)
Thank you for coming in today. START gabapentin now.  Take it 1x daily at night. Increase to three times daily over about a week.  You should hear about the MRI scheduling soon.  If you do not hear anything by Thursday call me.  If they cannot get you in before Monday ask to have it done in [place].   We will likely be ordering an epidural steroid injection based on the MRI results.   Come back or go to the emergency room if you notice new weakness new numbness problems walking or bowel or bladder problems.  Epidural Steroid Injection Patient Information  Description: The epidural space surrounds the nerves as they exit the spinal cord.  In some patients, the nerves can be compressed and inflamed by a bulging disc or a tight spinal canal (spinal stenosis).  By injecting steroids into the epidural space, we can bring irritated nerves into direct contact with a potentially helpful medication.  These steroids act directly on the irritated nerves and can reduce swelling and inflammation which often leads to decreased pain.  Epidural steroids may be injected anywhere along the spine and from the neck to the low back depending upon the location of your pain.   After numbing the skin with local anesthetic (like Novocaine), a small needle is passed into the epidural space slowly.  You may experience a sensation of pressure while this is being done.  The entire block usually last less than 10 minutes.  Conditions which may be treated by epidural steroids:   Low back and leg pain  Neck and arm pain  Spinal stenosis  Post-laminectomy syndrome  Herpes zoster (shingles) pain  Pain from compression fractures  Preparation for the injection:  1. Do not eat any solid food or dairy products within 8 hours of your appointment.  2. You may drink clear liquids up to 3 hours before appointment.  Clear liquids include water, black coffee, juice or soda.  No milk or cream please. 3. You may take  your regular medication, including pain medications, with a sip of water before your appointment  Diabetics should hold regular insulin (if taken separately) and take 1/2 normal NPH dos the morning of the procedure.  Carry some sugar containing items with you to your appointment. 4. A driver must accompany you and be prepared to drive you home after your procedure.  5. Bring all your current medications with your. 6. An IV may be inserted and sedation may be given at the discretion of the physician.   7. A blood pressure cuff, EKG and other monitors will often be applied during the procedure.  Some patients may need to have extra oxygen administered for a short period. 8. You will be asked to provide medical information, including your allergies, prior to the procedure.  We must know immediately if you are taking blood thinners (like Coumadin/Warfarin)  Or if you are allergic to IV iodine contrast (dye). We must know if you could possible be pregnant.  Possible side-effects:  Bleeding from needle site  Infection (rare, may require surgery)  Nerve injury (rare)  Numbness & tingling (temporary)  Difficulty urinating (rare, temporary)  Spinal headache ( a headache worse with upright posture)  Light -headedness (temporary)  Pain at injection site (several days)  Decreased blood pressure (temporary)  Weakness in arm/leg (temporary)  Pressure sensation in back/neck (temporary)  Call if you experience:  Fever/chills associated with headache or increased back/neck pain.  Headache worsened by an upright  position.  New onset weakness or numbness of an extremity below the injection site  Hives or difficulty breathing (go to the emergency room)  Inflammation or drainage at the infection site  Severe back/neck pain  Any new symptoms which are concerning to you  Please note:  Although the local anesthetic injected can often make your back or neck feel good for several hours after  the injection, the pain will likely return.  It takes 3-7 days for steroids to work in the epidural space.  You may not notice any pain relief for at least that one week.  If effective, we will often do a series of three injections spaced 3-6 weeks apart to maximally decrease your pain.  After the initial series, we generally will wait several months before considering a repeat injection of the same type.  If you have any questions, please call [phone] [facility]

## 2017-01-30 ENCOUNTER — Other Ambulatory Visit: Payer: PPO

## 2017-01-31 ENCOUNTER — Encounter: Payer: Self-pay | Admitting: Family Medicine

## 2017-01-31 ENCOUNTER — Ambulatory Visit (INDEPENDENT_AMBULATORY_CARE_PROVIDER_SITE_OTHER): Payer: PPO | Admitting: Family Medicine

## 2017-01-31 VITALS — BP 122/62 | HR 81 | Wt 164.0 lb

## 2017-01-31 DIAGNOSIS — M5416 Radiculopathy, lumbar region: Secondary | ICD-10-CM | POA: Diagnosis not present

## 2017-01-31 NOTE — Patient Instructions (Signed)
Thank you for coming in today. You will hear from my office today about MRI time.  Come back or go to the emergency room if you notice new weakness new numbness problems walking or bowel or bladder problems. You will have a MRI this week.

## 2017-01-31 NOTE — Progress Notes (Signed)
Edward Cuevas is a 61 y.o. male who presents to [facility] today for right lumbar radicular pain. Patient has been seen multiple times for back pain radiating to the right leg. He has had trials of physical therapy and gabapentin and continues to experience severe pain. In the interim the pain has worsened. He is unable to be comfortable even with rest. He also has developed some mild weakness of the right hip flexors. He denies bowel bladder dysfunction. He has developed some tremor in all 4 extremities. His wife notes that he drinks heavily and has not had any alcohol today. He denies fevers or chills.   Past Medical History:  Diagnosis Date  . Anxiety   . Arthritis   . Cancer ([place])    skin  . COPD (chronic obstructive pulmonary disease) ([place])    per pt ?  [name] Depression   . Hepatitis    A,B,C  . Hyperlipidemia   . Hypertension   . Prediabetes   . Vitamin D deficiency    Past Surgical History:  Procedure Laterality Date  . ELBOW SURGERY Right 1986  . [place]  . HERNIA REPAIR Left 1996  . INGUINAL HERNIA REPAIR Right 12/30/2015   Procedure: RIGHT RIGHT INGUINAL HERNIA REPAIR;  Surgeon: Ralene Ok, MD;  Location: [facility];  Service: General;  Laterality: Right;  . INSERTION OF MESH Right 12/30/2015   Procedure: INSERTION OF MESH;  Surgeon: Ralene Ok, MD;  Location: [facility];  Service: General;  Laterality: Right;  . SHOULDER SURGERY Left 2007   Social History  Substance Use Topics  . Smoking status: Current Every Day Smoker    Packs/day: 1.50    Years: 3.00    Types: Cigarettes  . Smokeless tobacco: Never Used  . Alcohol use 9.6 oz/week    4 Cans of beer, 12 Standard drinks or equivalent per week     Comment: Beer on occasion     ROS:  As above   Medications: Current Outpatient Prescriptions  Medication Sig Dispense Refill  . acetaminophen-codeine (TYLENOL #4) 300-60 MG tablet Take 0.5-1  tablets by mouth 3 (three) times daily as needed for moderate pain. 90 tablet 0  . Cholecalciferol (VITAMIN D PO) Take 5,000 Units by mouth daily. Takes 5000 units daily    . clonazePAM (KLONOPIN) 0.5 MG tablet TAKE ONE TABLET BY MOUTH 2 TIMES DAILY AS NEEDED FOR ANXIETY 60 tablet 5  . diclofenac (VOLTAREN) 75 MG EC tablet Take 1 tablet (75 mg total) by mouth 2 (two) times daily. 60 tablet 5  . fluorouracil (EFUDEX) 5 % cream Apply topically 2 (two) times daily. 40 g 1  . FLUoxetine (PROZAC) 40 MG capsule Take 1 capsule (40 mg total) by mouth 2 (two) times daily. 60 capsule 5  . gabapentin (NEURONTIN) 300 MG capsule One tab PO qHS for a week, then BID for a week, then TID. May double weekly to a max of 3,600mg /day 180 capsule 3  . hydrocortisone (ANUSOL-HC) 2.5 % rectal cream Place 1 application rectally 2 (two) times daily. 60 g 2  . oxybutynin (DITROPAN-XL) 5 MG 24 hr tablet Take 1 tablet (5 mg total) by mouth at bedtime. 30 tablet 1  . polyethylene glycol (MIRALAX / GLYCOLAX) packet Take 17 g by mouth 2 (two) times daily. Until stooling regularly 30 packet 2   No current facility-administered medications for this visit.    Allergies  Allergen Reactions  . Penicillins Itching  and Rash    Has patient had a PCN reaction causing immediate rash, facial/tongue/throat swelling, SOB or lightheadedness with hypotension: Yes Has patient had a PCN reaction causing severe rash involving mucus membranes or skin necrosis: No Has patient had a PCN reaction that required hospitalization No Has patient had a PCN reaction occurring within the last 10 years: No If all of the above answers are "NO", then may proceed with Cephalosporin use.      Exam:  BP 122/62   Pulse 81   Wt 164 lb (74.4 kg)   BMI 22.24 kg/m  General: Well Developed, well nourished, and in no acute distress.  Neuro/Psych: Alert and oriented x3, extra-ocular muscles intact, able to move all 4 extremities, sensation grossly  intact. Skin: Warm and dry, no rashes noted.  Respiratory: Not using accessory muscles, speaking in full sentences, trachea midline.  Cardiovascular: Pulses palpable, no extremity edema. Abdomen: Does not appear distended. MSK: L-spine nontender to midline.  Right leg slightly weak hip abduction and flexion. Otherwise intact to extension of the hip and knee and feet. Normal knee and foot flexion. Pulses Refill sensation intact. Intention tremor present in all 4 extremities.    No results found for this or any previous visit (from the past 48 hour(s)). No results found.    Assessment and Plan: 62 y.o. male with worsening lumbar radiculopathy failing conservative treatment now with weakness. Plan for urgent MRI. Patient is scheduled for tomorrow awaiting formal insurance approval. MRI for epidural steroid injection planning and potential surgical planning.  Tremor likely due to alcohol use. Patient has not had tremor previously. Plan for watchful waiting his symptoms worsen plan for benzodiazepine taper.   No orders of the defined types were placed in this encounter.   Discussed warning signs or symptoms. Please see discharge instructions. Patient expresses understanding.

## 2017-02-01 ENCOUNTER — Ambulatory Visit (HOSPITAL_COMMUNITY)
Admission: RE | Admit: 2017-02-01 | Discharge: 2017-02-01 | Disposition: A | Discharge status: Home / Self Care | Payer: PPO | Source: Ambulatory Visit | Attending: Family Medicine | Admitting: Family Medicine

## 2017-02-01 ENCOUNTER — Telehealth: Payer: Self-pay | Admitting: Family Medicine

## 2017-02-01 ENCOUNTER — Ambulatory Visit (HOSPITAL_COMMUNITY): Admission: RE | Admit: 2017-02-01 | Payer: PPO | Source: Ambulatory Visit

## 2017-02-01 DIAGNOSIS — M5136 Other intervertebral disc degeneration, lumbar region: Secondary | ICD-10-CM | POA: Insufficient documentation

## 2017-02-01 DIAGNOSIS — M5126 Other intervertebral disc displacement, lumbar region: Secondary | ICD-10-CM | POA: Insufficient documentation

## 2017-02-01 DIAGNOSIS — M48061 Spinal stenosis, lumbar region without neurogenic claudication: Secondary | ICD-10-CM | POA: Diagnosis not present

## 2017-02-01 DIAGNOSIS — M5416 Radiculopathy, lumbar region: Secondary | ICD-10-CM

## 2017-02-01 DIAGNOSIS — Q7649 Other congenital malformations of spine, not associated with scoliosis: Secondary | ICD-10-CM | POA: Insufficient documentation

## 2017-02-01 DIAGNOSIS — M545 Low back pain: Secondary | ICD-10-CM | POA: Diagnosis not present

## 2017-02-01 NOTE — Telephone Encounter (Signed)
Will contact pt with results and recommendations.

## 2017-02-01 NOTE — Telephone Encounter (Signed)
Lumbar MRI shows a pinched nerve on the right. Plan for epidural steroid injection.  You should be hearing soon about when and where.

## 2017-02-01 NOTE — Telephone Encounter (Signed)
Mr. Edward Cuevas got his MRI done today and said if there is anything that you from him just contact patient so that he can do so. Or if you need to share any results from the MRI with him contact him as well.

## 2017-02-02 ENCOUNTER — Telehealth: Payer: Self-pay | Admitting: Family Medicine

## 2017-02-02 NOTE — Telephone Encounter (Signed)
Information discussed with pt. Pt verbalized understanding. Ordered and demographics faxed to [facility] at [phone].

## 2017-02-02 NOTE — Telephone Encounter (Signed)
Patient needs help with [facility] imaging b/c they need to clear with insurance before he can be seen, he wanted to let you know that information. He knew you wanted to get things done quickly but he doesn't know how long this process will take, if you need to contact imaging to make it go quicker feel free or let him know what he can do on his end.

## 2017-02-03 NOTE — Telephone Encounter (Signed)
I think we got this sorted out.

## 2017-02-04 ENCOUNTER — Ambulatory Visit
Admission: RE | Admit: 2017-02-04 | Discharge: 2017-02-04 | Disposition: A | Discharge status: Home / Self Care | Payer: PPO | Source: Ambulatory Visit | Attending: Family Medicine | Admitting: Family Medicine

## 2017-02-04 VITALS — BP 152/66 | HR 71

## 2017-02-04 DIAGNOSIS — E785 Hyperlipidemia, unspecified: Secondary | ICD-10-CM

## 2017-02-04 DIAGNOSIS — M47817 Spondylosis without myelopathy or radiculopathy, lumbosacral region: Secondary | ICD-10-CM | POA: Diagnosis not present

## 2017-02-04 MED ORDER — IOPAMIDOL (ISOVUE-M 200) INJECTION 41%
1.0000 mL | Freq: Once | INTRAMUSCULAR | Status: AC
  Administered 2017-02-04: 1 mL via EPIDURAL

## 2017-02-04 MED ORDER — METHYLPREDNISOLONE ACETATE 40 MG/ML INJ SUSP (RADIOLOG
120.0000 mg | Freq: Once | INTRAMUSCULAR | Status: AC
  Administered 2017-02-04: 120 mg via EPIDURAL

## 2017-02-04 NOTE — Discharge Instructions (Signed)

## 2017-02-08 ENCOUNTER — Ambulatory Visit (HOSPITAL_COMMUNITY): Payer: PPO

## 2017-02-14 ENCOUNTER — Other Ambulatory Visit: Payer: Self-pay | Admitting: Physician Assistant

## 2017-02-14 ENCOUNTER — Other Ambulatory Visit: Payer: Self-pay

## 2017-02-14 ENCOUNTER — Telehealth: Payer: Self-pay

## 2017-02-14 DIAGNOSIS — M2550 Pain in unspecified joint: Principal | ICD-10-CM

## 2017-02-14 DIAGNOSIS — G8929 Other chronic pain: Secondary | ICD-10-CM

## 2017-02-14 MED ORDER — ACETAMINOPHEN-CODEINE #4 300-60 MG PO TABS: 0.5000 | ORAL_TABLET | Freq: Three times a day (TID) | ORAL | 0 refills | Status: DC | PRN

## 2017-02-14 NOTE — Telephone Encounter (Signed)
Pt is requesting a refill on Tylenol #4, please advise

## 2017-02-14 NOTE — Telephone Encounter (Signed)
Ok to print and leave in my box to sign. We can fax this medication.

## 2017-02-21 ENCOUNTER — Ambulatory Visit (INDEPENDENT_AMBULATORY_CARE_PROVIDER_SITE_OTHER): Payer: PPO | Admitting: Family Medicine

## 2017-02-21 ENCOUNTER — Other Ambulatory Visit: Payer: Self-pay | Admitting: Physician Assistant

## 2017-02-21 VITALS — BP 143/85 | HR 93 | Temp 98.5°F | Wt 156.0 lb

## 2017-02-21 DIAGNOSIS — F410 Panic disorder [episodic paroxysmal anxiety] without agoraphobia: Secondary | ICD-10-CM

## 2017-02-21 DIAGNOSIS — M25571 Pain in right ankle and joints of right foot: Secondary | ICD-10-CM

## 2017-02-21 DIAGNOSIS — M5416 Radiculopathy, lumbar region: Secondary | ICD-10-CM

## 2017-02-21 NOTE — Patient Instructions (Signed)
Thank you for coming in today. You should hear soon about both the injection and the surgery referral.   Return as needed.   Come back or go to the emergency room if you notice new weakness new numbness problems walking or bowel or bladder problems.

## 2017-02-21 NOTE — Progress Notes (Signed)
Edward Cuevas is a 61 y.o. male who presents to [facility] today for lumbar radiculopathy.  Epidural steroid injection provided relief of pain for 3-4 days before return to baseline pain.  He notes that he was walking and felt his leg give way. He fell to the ground and injured his right ankle. He denies severe weakness in his right leg bowel bladder dysfunction fevers or chills. He notes the pain is quite persistent and obnoxious.    Past Medical History:  Diagnosis Date  . Anxiety   . Arthritis   . Cancer ([place])    skin  . COPD (chronic obstructive pulmonary disease) ([place])    per pt ?  [name] Depression   . Hepatitis    A,B,C  . Hyperlipidemia   . Hypertension   . Prediabetes   . Vitamin D deficiency    Past Surgical History:  Procedure Laterality Date  . ELBOW SURGERY Right 1986  . [place]  . HERNIA REPAIR Left 1996  . INGUINAL HERNIA REPAIR Right 12/30/2015   Procedure: RIGHT RIGHT INGUINAL HERNIA REPAIR;  Surgeon: Ralene Ok, MD;  Location: [facility];  Service: General;  Laterality: Right;  . INSERTION OF MESH Right 12/30/2015   Procedure: INSERTION OF MESH;  Surgeon: Ralene Ok, MD;  Location: [facility];  Service: General;  Laterality: Right;  . SHOULDER SURGERY Left 2007   Social History  Substance Use Topics  . Smoking status: Current Every Day Smoker    Packs/day: 1.50    Years: 3.00    Types: Cigarettes  . Smokeless tobacco: Never Used  . Alcohol use 9.6 oz/week    4 Cans of beer, 12 Standard drinks or equivalent per week     Comment: Beer on occasion     ROS:  As above   Medications: Current Outpatient Prescriptions  Medication Sig Dispense Refill  . acetaminophen-codeine (TYLENOL #4) 300-60 MG tablet Take 0.5-1 tablets by mouth 3 (three) times daily as needed for moderate pain. 90 tablet 0  . Cholecalciferol (VITAMIN D PO) Take 5,000 Units by mouth daily. Takes 5000 units daily    .  clonazePAM (KLONOPIN) 0.5 MG tablet TAKE ONE TABLET BY MOUTH 2 TIMES DAILY AS NEEDED FOR ANXIETY 60 tablet 5  . diclofenac (VOLTAREN) 75 MG EC tablet Take 1 tablet (75 mg total) by mouth 2 (two) times daily. 60 tablet 5  . fluorouracil (EFUDEX) 5 % cream Apply topically 2 (two) times daily. 40 g 1  . FLUoxetine (PROZAC) 40 MG capsule Take 1 capsule (40 mg total) by mouth 2 (two) times daily. 60 capsule 5  . gabapentin (NEURONTIN) 300 MG capsule One tab PO qHS for a week, then BID for a week, then TID. May double weekly to a max of 3,600mg /day 180 capsule 3  . hydrocortisone (ANUSOL-HC) 2.5 % rectal cream Place 1 application rectally 2 (two) times daily. 60 g 2  . oxybutynin (DITROPAN-XL) 5 MG 24 hr tablet Take 1 tablet (5 mg total) by mouth at bedtime. 30 tablet 1  . polyethylene glycol (MIRALAX / GLYCOLAX) packet Take 17 g by mouth 2 (two) times daily. Until stooling regularly 30 packet 2   No current facility-administered medications for this visit.    Allergies  Allergen Reactions  . Penicillins Itching and Rash    Has patient had a PCN reaction causing immediate rash, facial/tongue/throat swelling, SOB or lightheadedness with hypotension: Yes Has patient had a PCN reaction causing  severe rash involving mucus membranes or skin necrosis: No Has patient had a PCN reaction that required hospitalization No Has patient had a PCN reaction occurring within the last 10 years: No If all of the above answers are "NO", then may proceed with Cephalosporin use.      Exam:  BP (!) 143/85 (BP Location: Left Arm, Patient Position: Sitting, Cuff Size: Normal)   Pulse 93   Temp 98.5 F (36.9 C) (Oral)   Wt 156 lb (70.8 kg)   SpO2 96%   BMI 21.16 kg/m  General: Well Developed, well nourished, and in no acute distress.  Neuro/Psych: Alert and oriented x3, extra-ocular muscles intact, able to move all 4 extremities, sensation grossly intact. Skin: Warm and dry, no rashes noted.  Respiratory: Not  using accessory muscles, speaking in full sentences, trachea midline.  Cardiovascular: Pulses palpable, no extremity edema. Abdomen: Does not appear distended. MSK:   L-spine nontender to midline. Back motion limited by pain. Positive right-sided slump tests. Right lower extremity unremarkable. Strength is intact however patient does have slight weakness to foot dorsiflexion testing. He walks with a cane with an antalgic gait.   No results found for this or any previous visit (from the past 48 hour(s)). No results found.  CLINICAL DATA:  Chronic low back pain radiating into the right leg. No known injury.  EXAM: MRI LUMBAR SPINE WITHOUT CONTRAST  TECHNIQUE: Multiplanar, multisequence MR imaging of the lumbar spine was performed. No intravenous contrast was administered.  COMPARISON:  Plain films lumbar spine 12/29/2016.  FINDINGS: Segmentation: The patient has transitional lumbosacral anatomy. Based on comparison with prior plain films, there are hypoplastic ribs off the T12 vertebral body. On this examination, the lowest level imaged in the axial plane is labeled L5-S1. This numbering scheme is different than that used on the prior plain films. Based on this numbering scheme, there is sacralization of the lowest lumbar segment on the left.  Alignment:  Mild convex right scoliosis is seen.  No listhesis.  Vertebrae:  No fracture or worrisome lesion.  Conus medullaris: Extends to the L1 level and appears normal.  Paraspinal and other soft tissues: Negative.  Disc levels:  T10-11 and T11-12 are imaged in the sagittal plane only. There is a minimal disc bulge at T10-11 but the central canal and foramina are open. T11-12 is negative.  T12-L1:  Negative.  L1-2: Minimal disc bulge without central canal or foraminal narrowing.  L2-3: Shallow disc bulge and a small left foraminal protrusion are identified. Disc contacts the exiting left L2 root  without compression or displacement. The central canal and right foramen are open.  L3-4: Mild to moderate facet degenerative change is worse on the right. There is a minimal disc bulge without central canal or foraminal stenosis.  L4-5: Moderate to advanced facet degenerative change is worse on the right. There is a shallow disc bulge without central canal stenosis. The left foramen is open. Moderate right foraminal narrowing is predominantly due to facet arthropathy.  L5-S1:  Transitional segment.  Negative.  IMPRESSION: Transitional lumbosacral anatomy. Please see numbering scheme above which is different than that used on the prior plain films.  Small left foraminal protrusion at L2-3 contacts the exiting left L2 root without compression or displacement.  Facet degenerative disease on the right at L4-5 causes moderate right foraminal narrowing.   Electronically Signed   By: Inge Rise M.D.   On: 02/01/2017 13:48  Assessment and Plan: 61 y.o. male with right-sided lumbar radiculopathy  likely L4 or L5 nerve roots. MRI shows degenerative changes in this area with potential neural impingement. He had a few days relieved with prior steroid injection. Since he is becoming weak I think it's reasonable to refer to neurosurgery while also pursuing further injections.    Orders Placed This Encounter  Procedures  . DG Epidurography    LUMB EPI #2 RIGHT L4-5 HTA-auth#[id number] good for 3 injs exp 05/03/17//js PACS (02/01/17)  NO THINNERS EST NO ALLERGIES    Order Specific Question:   Reason for Exam (SYMPTOM  OR DIAGNOSIS REQUIRED)    Answer:   right L4-5 repeat    Order Specific Question:   Preferred imaging location?    Answer:   [id number]  . Ambulatory referral to Neurosurgery    Referral Priority:   Routine    Referral Type:   Surgical    Referral Reason:   Specialty Services Required    Requested Specialty:   Neurosurgery    Number of Visits  Requested:   1    Discussed warning signs or symptoms. Please see discharge instructions. Patient expresses understanding.

## 2017-02-21 NOTE — Progress Notes (Signed)
Pt here for follow up on back pain.  Had epidural steroid injection, helped about 3 days then no relief.  [place] about 1 week ago, right knee went out and twisted ankle.

## 2017-02-22 NOTE — Progress Notes (Signed)
Called  Edward Cuevas 02/21/17

## 2017-03-02 ENCOUNTER — Ambulatory Visit
Admission: RE | Admit: 2017-03-02 | Discharge: 2017-03-02 | Disposition: A | Discharge status: Home / Self Care | Payer: PPO | Source: Ambulatory Visit | Attending: Family Medicine | Admitting: Family Medicine

## 2017-03-02 DIAGNOSIS — M5126 Other intervertebral disc displacement, lumbar region: Secondary | ICD-10-CM | POA: Diagnosis not present

## 2017-03-02 MED ORDER — IOPAMIDOL (ISOVUE-M 200) INJECTION 41%
1.0000 mL | Freq: Once | INTRAMUSCULAR | Status: AC
  Administered 2017-03-02: 1 mL via EPIDURAL

## 2017-03-02 MED ORDER — METHYLPREDNISOLONE ACETATE 40 MG/ML INJ SUSP (RADIOLOG
120.0000 mg | Freq: Once | INTRAMUSCULAR | Status: AC
  Administered 2017-03-02: 120 mg via EPIDURAL

## 2017-03-16 ENCOUNTER — Other Ambulatory Visit: Payer: Self-pay | Admitting: Physician Assistant

## 2017-03-16 DIAGNOSIS — G8929 Other chronic pain: Secondary | ICD-10-CM

## 2017-03-16 DIAGNOSIS — M2550 Pain in unspecified joint: Principal | ICD-10-CM

## 2017-03-25 IMAGING — CR DG FINGER MIDDLE 2+V*L*
3 series · 3 of 3 positions shown · non-contrast
Comparison: Earlier today at 2129 hours

CLINICAL DATA: Status post reduction of left middle finger.

EXAM:
LEFT MIDDLE FINGER 2+V

[x finger pa left]
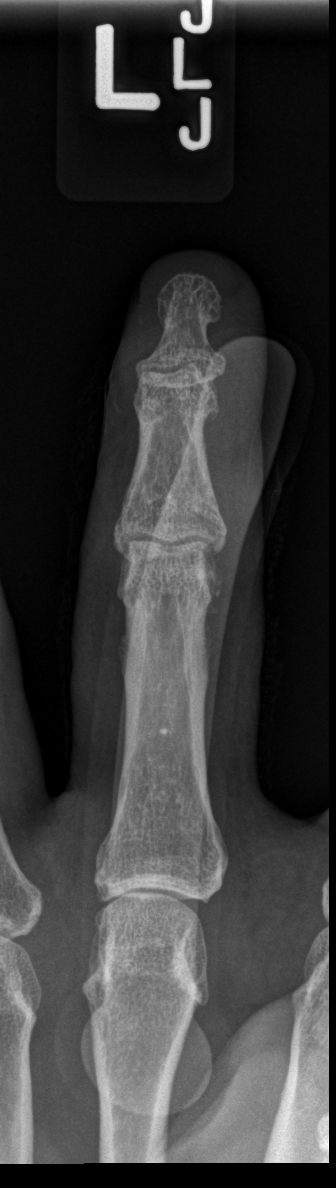

[x finger obl left]
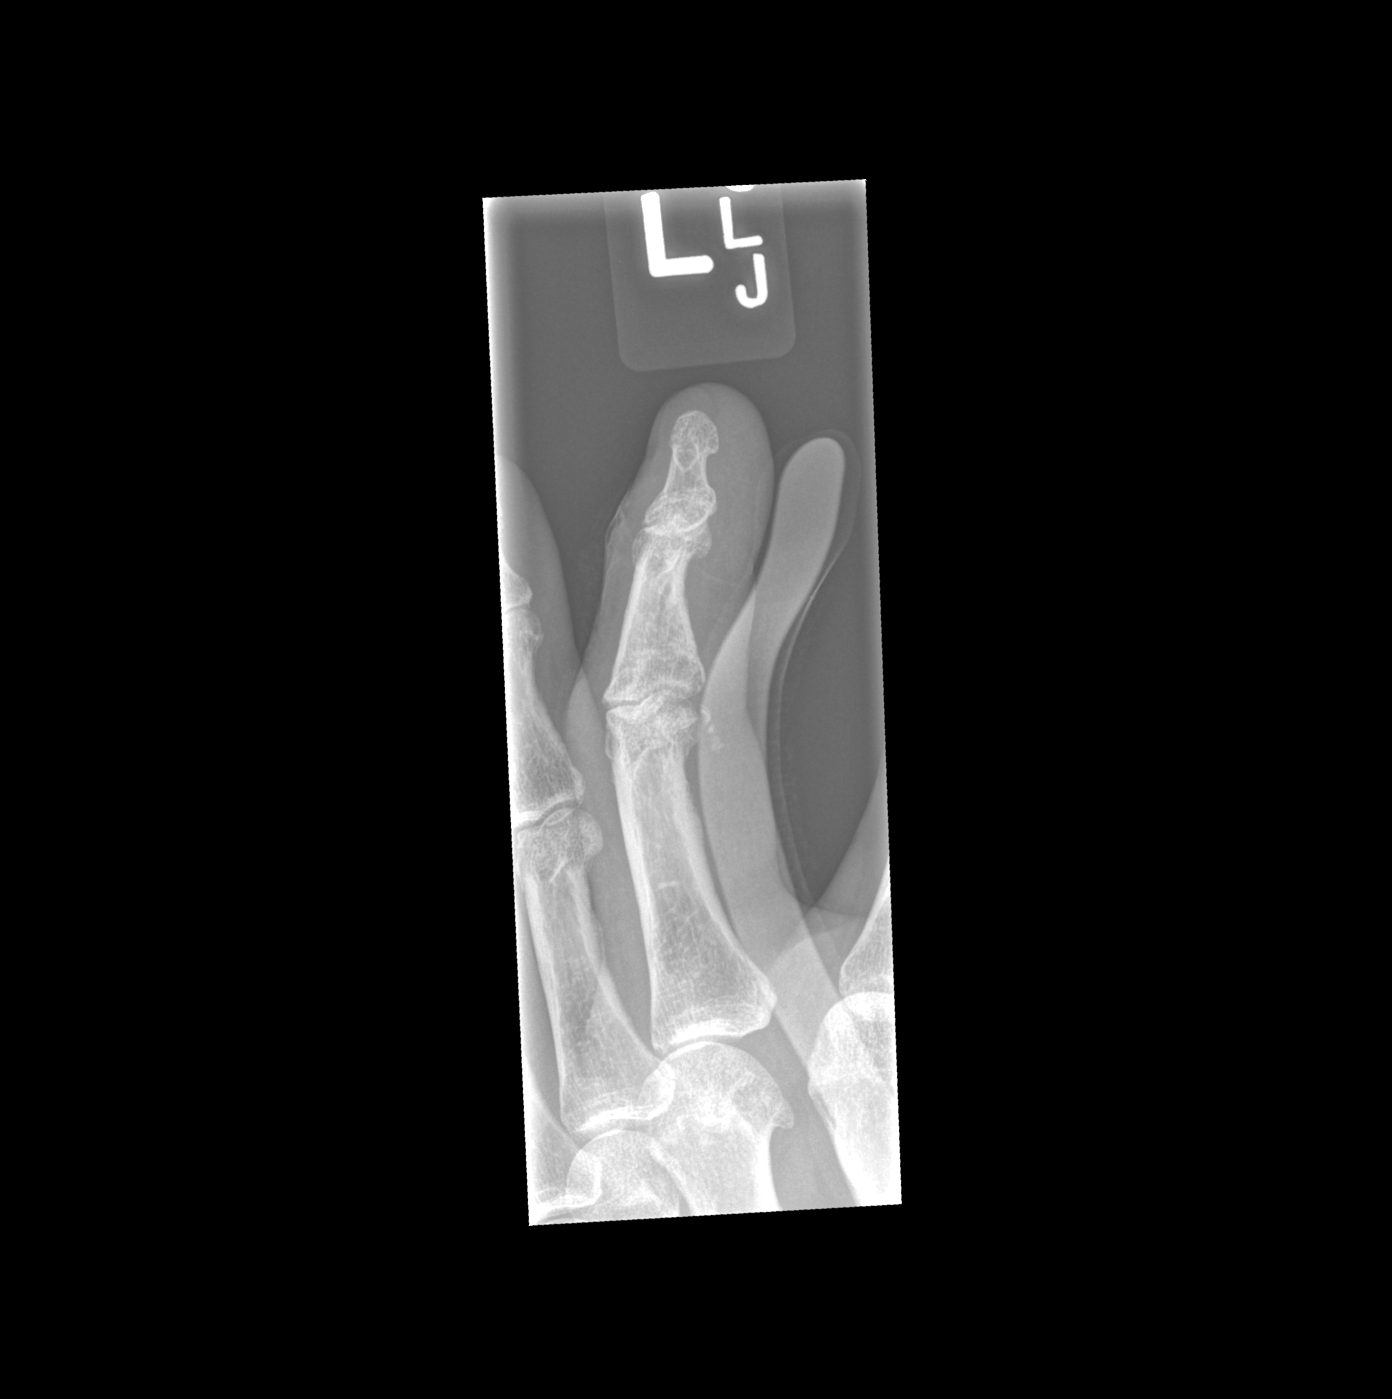

[x finger lat left]
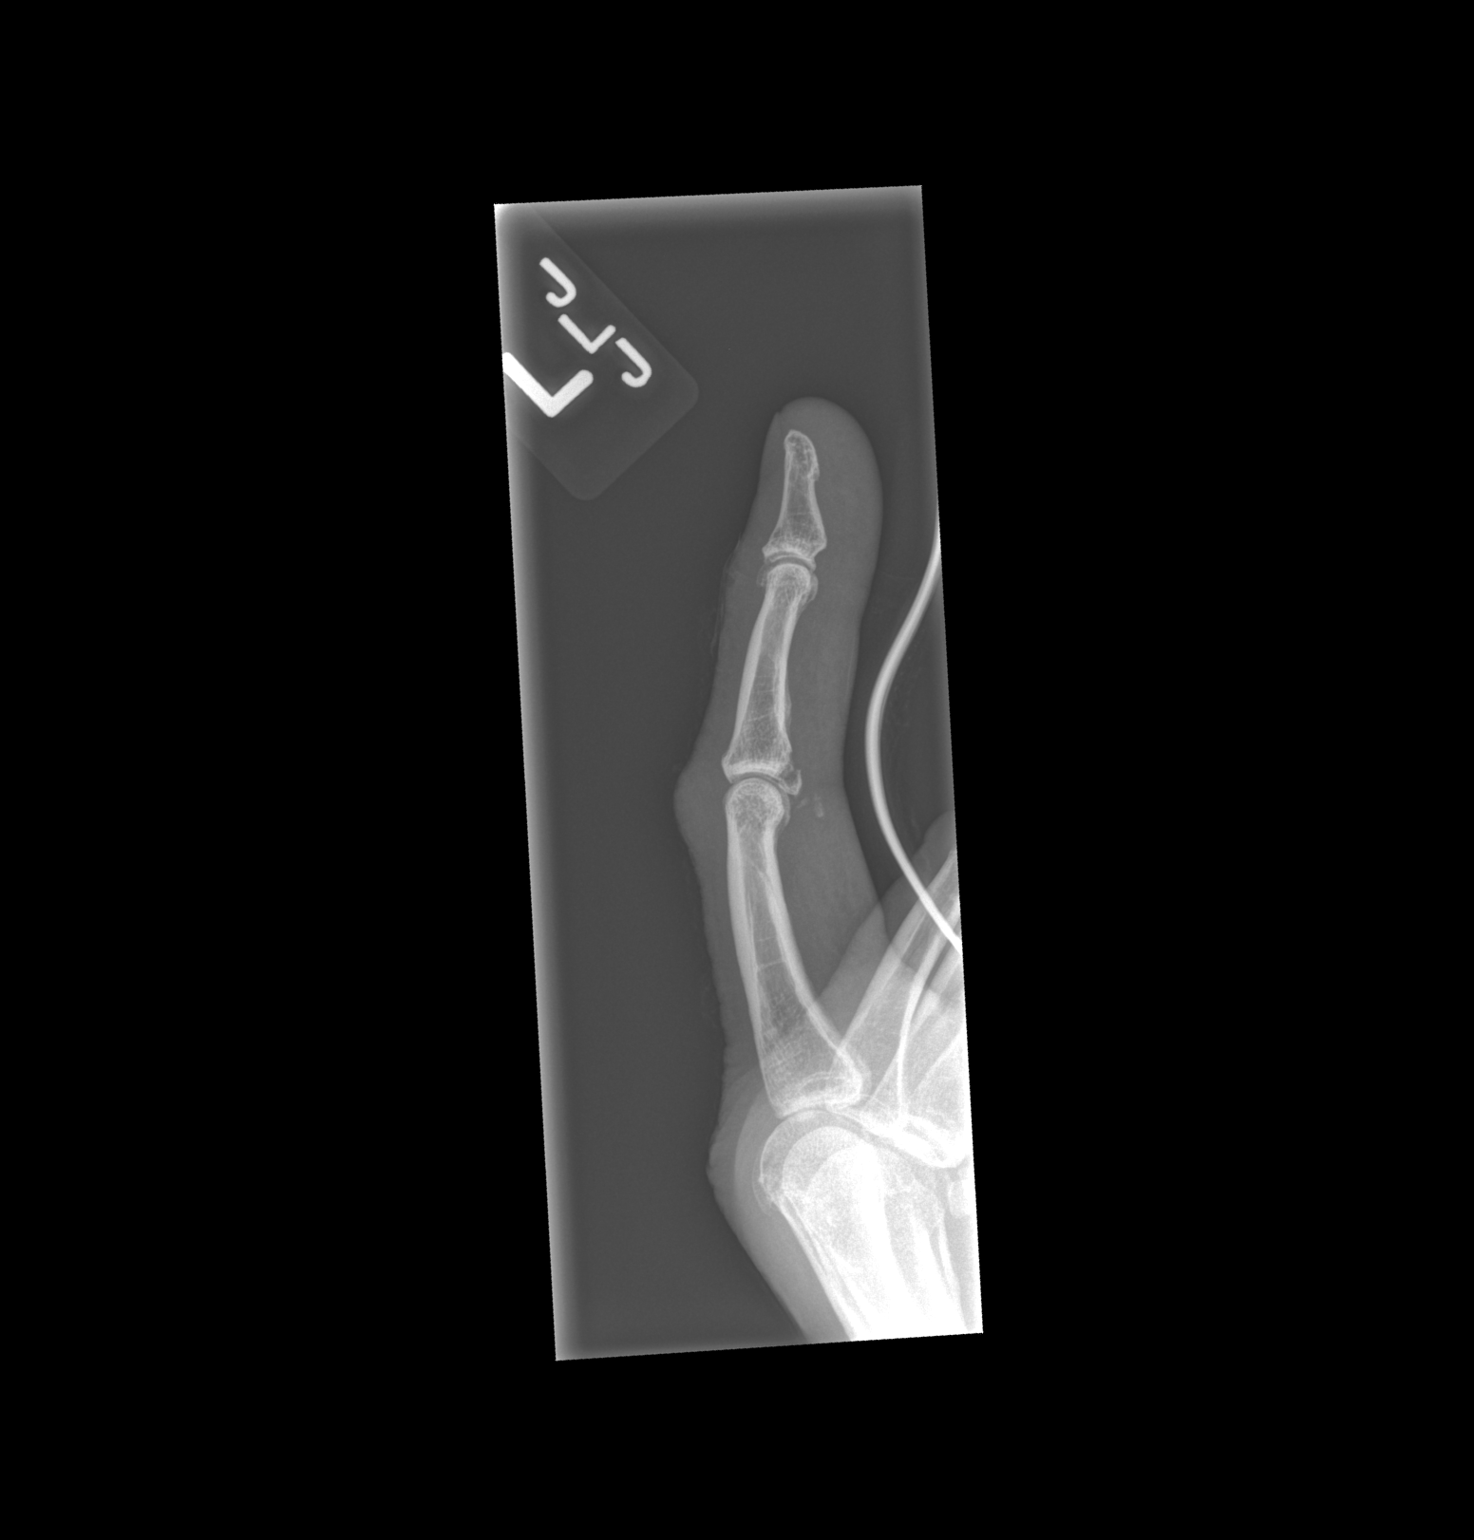

[3 of 3 positions shown; findings below may reference images not displayed]

FINDINGS: 9773 hours. Details obscured by overlying splint. Degenerative
changes, including within the proximal and distal interphalangeal
joints. Interval relocation of the proximal interphalangeal joint of
the left middle finger. Volar plate avulsion fracture identified
about the proximal aspect of the middle phalanx. Dorsal soft tissue
swelling.
IMPRESSION: Interval relocation of the proximal interphalangeal joint. Volar
plate avulsion fracture, as before.

## 2017-04-15 ENCOUNTER — Other Ambulatory Visit: Payer: Self-pay | Admitting: Physician Assistant

## 2017-04-17 ENCOUNTER — Other Ambulatory Visit: Payer: Self-pay | Admitting: Physician Assistant

## 2017-04-19 ENCOUNTER — Other Ambulatory Visit: Payer: Self-pay | Admitting: *Deleted

## 2017-04-19 DIAGNOSIS — M2550 Pain in unspecified joint: Principal | ICD-10-CM

## 2017-04-19 DIAGNOSIS — G8929 Other chronic pain: Secondary | ICD-10-CM

## 2017-04-19 MED ORDER — ACETAMINOPHEN-CODEINE #4 300-60 MG PO TABS: ORAL_TABLET | ORAL | 0 refills | Status: DC

## 2017-04-19 NOTE — Progress Notes (Signed)
Pt called for refill of Tylenol #4. rx printed and given to Dr. Sheppard Coil for signature. Will fax to [facility] in [place].Edward Cuevas [place]

## 2017-05-24 ENCOUNTER — Other Ambulatory Visit: Payer: Self-pay | Admitting: Physician Assistant

## 2017-05-24 ENCOUNTER — Other Ambulatory Visit: Payer: Self-pay | Admitting: Osteopathic Medicine

## 2017-05-24 DIAGNOSIS — F410 Panic disorder [episodic paroxysmal anxiety] without agoraphobia: Secondary | ICD-10-CM

## 2017-05-24 DIAGNOSIS — G8929 Other chronic pain: Secondary | ICD-10-CM

## 2017-05-24 DIAGNOSIS — M2550 Pain in unspecified joint: Principal | ICD-10-CM

## 2017-05-27 ENCOUNTER — Other Ambulatory Visit: Payer: Self-pay

## 2017-05-27 DIAGNOSIS — F410 Panic disorder [episodic paroxysmal anxiety] without agoraphobia: Secondary | ICD-10-CM

## 2017-05-27 DIAGNOSIS — M2550 Pain in unspecified joint: Principal | ICD-10-CM

## 2017-05-27 DIAGNOSIS — G8929 Other chronic pain: Secondary | ICD-10-CM

## 2017-05-27 MED ORDER — CLONAZEPAM 0.5 MG PO TABS: ORAL_TABLET | ORAL | 0 refills | Status: DC

## 2017-05-27 NOTE — Telephone Encounter (Signed)
Call patient: We can fill his medication but we have to get him up-to-date with current with [place] guidelines for prescription narcotic prescribing. He needs to come in and complete a pain contract. He needs to complete an opioid risk tool assessment and he needs to give a urine sample. So please let him know he will not be able to continue both a pain medication and the clonazepam. These 2 are contraindicated as the combination increases the risk of death for full. Again per [facility] guidelines he will need to choose which one he would prefer to keep. If he decides that he wants to continue with the Tylenol No. 4 then we will wean the clonazepam. We will not stop it abruptly but will wean it over the next couple of months.

## 2017-05-27 NOTE — Telephone Encounter (Signed)
Rx written. Pt has upcoming [place] Monday, will give taper Rx at that visit.

## 2017-05-27 NOTE — Telephone Encounter (Signed)
Spoke to patient he stated that he would like to keep the pain medication and gradually be weaned off the Clonazepam. Patient was transferred up front to schedule appointment and he verbally understood everything on what he needs to do. Savina Olshefski,CMA

## 2017-05-27 NOTE — Telephone Encounter (Signed)
Patient request refill for Tylenol 4. Patient has been taking this medication since 09/19/13. Please advise if ok to refill. Teylor Wolven,CMA

## 2017-05-27 NOTE — Telephone Encounter (Signed)
On next refill for clonazepam will decrease down by taper.   1 tab in AM and 1/2 tab in PM x 10 days, Then 1/2 tab AM and PM x 10 days,  Then 1/2 tab QD x 10 days Then 1/2 tab every other day x 10 days.  Total 33 tabs.

## 2017-05-30 ENCOUNTER — Other Ambulatory Visit: Payer: Self-pay

## 2017-05-30 ENCOUNTER — Other Ambulatory Visit: Payer: Self-pay | Admitting: Physician Assistant

## 2017-05-30 ENCOUNTER — Telehealth: Payer: Self-pay

## 2017-05-30 ENCOUNTER — Ambulatory Visit: Payer: PPO

## 2017-05-30 DIAGNOSIS — G8929 Other chronic pain: Secondary | ICD-10-CM

## 2017-05-30 DIAGNOSIS — M2550 Pain in unspecified joint: Principal | ICD-10-CM

## 2017-05-30 NOTE — Telephone Encounter (Signed)
Pt called requesting a refill on Tylenol #4.  Is this appropriate?  Please advise.

## 2017-05-30 NOTE — Telephone Encounter (Signed)
Pt was told to come in for appt since not been seen by myself in a while.

## 2017-06-01 ENCOUNTER — Ambulatory Visit (INDEPENDENT_AMBULATORY_CARE_PROVIDER_SITE_OTHER): Payer: PPO | Admitting: Physician Assistant

## 2017-06-01 ENCOUNTER — Encounter: Payer: Self-pay | Admitting: Physician Assistant

## 2017-06-01 VITALS — BP 132/79 | HR 79 | Wt 153.0 lb

## 2017-06-01 DIAGNOSIS — M5137 Other intervertebral disc degeneration, lumbosacral region: Secondary | ICD-10-CM

## 2017-06-01 DIAGNOSIS — R05 Cough: Secondary | ICD-10-CM

## 2017-06-01 DIAGNOSIS — M2550 Pain in unspecified joint: Secondary | ICD-10-CM | POA: Diagnosis not present

## 2017-06-01 DIAGNOSIS — G894 Chronic pain syndrome: Secondary | ICD-10-CM

## 2017-06-01 DIAGNOSIS — G8929 Other chronic pain: Secondary | ICD-10-CM | POA: Diagnosis not present

## 2017-06-01 DIAGNOSIS — R059 Cough, unspecified: Secondary | ICD-10-CM

## 2017-06-01 DIAGNOSIS — F411 Generalized anxiety disorder: Secondary | ICD-10-CM

## 2017-06-01 DIAGNOSIS — R52 Pain, unspecified: Secondary | ICD-10-CM

## 2017-06-01 MED ORDER — ACETAMINOPHEN-CODEINE #4 300-60 MG PO TABS: ORAL_TABLET | ORAL | 0 refills | Status: DC

## 2017-06-01 MED ORDER — ALBUTEROL SULFATE HFA 108 (90 BASE) MCG/ACT IN AERS: 2.0000 | INHALATION_SPRAY | Freq: Four times a day (QID) | RESPIRATORY_TRACT | 2 refills | Status: DC | PRN

## 2017-06-01 NOTE — Progress Notes (Signed)
Subjective:    Patient ID: Edward Cuevas, male    DOB: [birth year], 61 y.o.   MRN: [id number]  HPI  Pt is a 61 yo male who presents to the clinic for chronic pain management. He has not filled out pain contract for tylenol 4. He remains on gabapentin. He is working with Dr. Georgina Snell and states his lumbar pain and improved. He continues to need tylenol 4 for bilateral rib pain after fall a few days ago.   GAD- had been using klonapin and prozac. He does not feel like prozac helps at all. He states he has not used everyday.   He occasionaly will have coughing fits. He request albuterol inhaler.   .. Active Ambulatory Problems    Diagnosis Date Noted  . Hyperlipidemia   . Hypertension   . Prediabetes   . Vitamin D deficiency   . DJD 10/23/2013  . DDD (degenerative disc disease), lumbosacral 10/23/2013  . Encounter for long-term (current) use of other medications 04/08/2014  . Panic attacks 10/04/2014  . History of hepatitis C 10/08/2014  . Right inguinal hernia 10/08/2014  . GAD (generalized anxiety disorder) 11/01/2014  . Olecranon bursitis 01/31/2015  . Actinic keratosis 06/27/2015  . BPH without obstruction/lower urinary tract symptoms 11/25/2015  . Chronic pain of multiple joints 11/25/2015  . Fatty liver 10/01/2016  . Urinary frequency 12/29/2016  . Routine physical examination 12/29/2016  . COPD (chronic obstructive pulmonary disease) ([place]) 12/29/2016  . Lumbar radiculopathy 01/18/2017  . Right ankle pain 02/21/2017   Resolved Ambulatory Problems    Diagnosis Date Noted  . No Resolved Ambulatory Problems   Past Medical History:  Diagnosis Date  . Anxiety   . Arthritis   . Cancer ([place])   . COPD (chronic obstructive pulmonary disease) ([place])   . Depression   . Hepatitis   . Hyperlipidemia   . Hypertension   . Prediabetes   . Vitamin D deficiency       Review of Systems See HPI.     Objective:   Physical Exam  Constitutional: He is oriented to person, place,  and time. He appears well-developed and well-nourished.  HENT:  Head: Normocephalic and atraumatic.  Cardiovascular: Normal rate, regular rhythm and normal heart sounds.   Pulmonary/Chest: Effort normal and breath sounds normal.  Neurological: He is alert and oriented to person, place, and time.  Psychiatric: He has a normal mood and affect. His behavior is normal.          Assessment & Plan:  .Marland KitchenGumaro was seen today for other.  Diagnoses and all orders for this visit:  Chronic pain syndrome -     acetaminophen-codeine (TYLENOL #4) 300-60 MG tablet; TAKE 1/2 TO 1 (ONE-HALF TO ONE) TABLET BY MOUTH THREE TIMES DAILY AS NEEDED FOR  MODERATE  PAIN  Pain management -     Pain Mgmt, Profile 6 Conf w/o mM, U  Chronic pain of multiple joints -     acetaminophen-codeine (TYLENOL #4) 300-60 MG tablet; TAKE 1/2 TO 1 (ONE-HALF TO ONE) TABLET BY MOUTH THREE TIMES DAILY AS NEEDED FOR  MODERATE  PAIN  GAD (generalized anxiety disorder)  Cough -     albuterol (PROVENTIL HFA;VENTOLIN HFA) 108 (90 Base) MCG/ACT inhaler; Inhale 2 puffs into the lungs every 6 (six) hours as needed for wheezing or shortness of breath.     Pain contract signed.  UDS ordered.  Opoid risk assessment tool 1, low risk.  Tapering off klonapin due to increased risk of  sudden death with opoid's and benzo. Pt is in agreement.  Discussed with patient to come back for cough. Albuterol given as needed but come back to evaluate.

## 2017-06-01 NOTE — Telephone Encounter (Signed)
Left a message advising patient to schedule an appointment.

## 2017-06-05 ENCOUNTER — Encounter: Payer: Self-pay | Admitting: Physician Assistant

## 2017-06-08 ENCOUNTER — Encounter: Payer: Self-pay | Admitting: Physician Assistant

## 2017-06-08 ENCOUNTER — Other Ambulatory Visit: Payer: Self-pay

## 2017-06-08 MED ORDER — ALBUTEROL SULFATE HFA 108 (90 BASE) MCG/ACT IN AERS: 1.0000 | INHALATION_SPRAY | Freq: Four times a day (QID) | RESPIRATORY_TRACT | 2 refills | Status: DC | PRN

## 2017-06-09 LAB — PAIN MGMT, PROFILE 6 CONF W/O MM, U
6 Acetylmorphine: NEGATIVE ng/mL (ref <10)
ALCOHOL METABOLITES: POSITIVE ng/mL — AB (ref <500)
ALPHAHYDROXYMIDAZOLAM: NEGATIVE ng/mL (ref <50)
ALPHAHYDROXYTRIAZOLAM: NEGATIVE ng/mL (ref <50)
AMINOCLONAZEPAM: NEGATIVE ng/mL (ref <25)
AMPHETAMINES: NEGATIVE ng/mL (ref <500)
Alphahydroxyalprazolam: NEGATIVE ng/mL (ref <25)
BARBITURATES: NEGATIVE ng/mL (ref <300)
BENZODIAZEPINES: NEGATIVE ng/mL (ref <100)
COCAINE METABOLITE: NEGATIVE ng/mL (ref <150)
Codeine: >50000 ng/mL — ABNORMAL HIGH (ref <50)
Creatinine: 131.2 mg/dL (ref >=20.0)
ETHYL SULFATE (ETS): 4591 ng/mL — AB (ref <100)
Ethyl Glucuronide (ETG): 15560 ng/mL — ABNORMAL HIGH (ref <500)
HYDROMORPHONE: NEGATIVE ng/mL (ref <50)
Hydrocodone: 204 ng/mL — ABNORMAL HIGH (ref <50)
Hydroxyethylflurazepam: NEGATIVE ng/mL (ref <50)
Lorazepam: NEGATIVE ng/mL (ref <50)
MARIJUANA METABOLITE: 51 ng/mL — AB (ref <5)
MARIJUANA METABOLITE: POSITIVE ng/mL — AB (ref <20)
MORPHINE: 979 ng/mL — AB (ref <50)
Methadone Metabolite: NEGATIVE ng/mL (ref <100)
NORDIAZEPAM: NEGATIVE ng/mL (ref <50)
NORHYDROCODONE: 333 ng/mL — AB (ref <50)
OPIATES: POSITIVE ng/mL — AB (ref <100)
OXAZEPAM: NEGATIVE ng/mL (ref <50)
OXYCODONE: NEGATIVE ng/mL (ref <100)
Oxidant: NEGATIVE ug/mL (ref <200)
PHENCYCLIDINE: NEGATIVE ng/mL (ref <25)
Please note:: 0
TEMAZEPAM: NEGATIVE ng/mL (ref <50)
pH: 6.12 (ref 4.5–9.0)

## 2017-06-09 NOTE — Progress Notes (Signed)
On UDS detected marijuana and alcohol. You cannot do or takes these 2 things and be on chronic pain medication. We will randomly check again if in system will no longer prescribe tylenol 4.

## 2017-06-15 LAB — PAIN MGMT, PROFILE 6 CONF W/O MM, U
6 ACETYLMORPHINE: NEGATIVE ng/mL (ref <10)
ALPHAHYDROXYMIDAZOLAM: NEGATIVE ng/mL (ref <50)
AMINOCLONAZEPAM: 101 ng/mL — AB (ref <25)
AMPHETAMINES: NEGATIVE ng/mL (ref <500)
Alcohol Metabolites: POSITIVE ng/mL — AB (ref <500)
Alphahydroxyalprazolam: NEGATIVE ng/mL (ref <25)
Alphahydroxytriazolam: NEGATIVE ng/mL (ref <50)
Barbiturates: NEGATIVE ng/mL (ref <300)
Benzodiazepines: POSITIVE ng/mL — AB (ref <100)
Cocaine Metabolite: NEGATIVE ng/mL (ref <150)
Codeine: 38514 ng/mL — ABNORMAL HIGH (ref <50)
Creatinine: 123.1 mg/dL (ref >=20.0)
ETHYL SULFATE (ETS): 12795 ng/mL — AB (ref <100)
Ethyl Glucuronide (ETG): 45029 ng/mL — ABNORMAL HIGH (ref <500)
HYDROCODONE: 143 ng/mL — AB (ref <50)
HYDROMORPHONE: NEGATIVE ng/mL (ref <50)
Hydroxyethylflurazepam: NEGATIVE ng/mL (ref <50)
LORAZEPAM: NEGATIVE ng/mL (ref <50)
MARIJUANA METABOLITE: 67 ng/mL — AB (ref <5)
MORPHINE: 449 ng/mL — AB (ref <50)
Marijuana Metabolite: POSITIVE ng/mL — AB (ref <20)
Methadone Metabolite: NEGATIVE ng/mL (ref <100)
NORDIAZEPAM: NEGATIVE ng/mL (ref <50)
Norhydrocodone: 324 ng/mL — ABNORMAL HIGH (ref <50)
OPIATES: POSITIVE ng/mL — AB (ref <100)
OXAZEPAM: NEGATIVE ng/mL (ref <50)
OXIDANT: NEGATIVE ug/mL (ref <200)
Oxycodone: NEGATIVE ng/mL (ref <100)
PH: 6.92 (ref 4.5–9.0)
PHENCYCLIDINE: NEGATIVE ng/mL (ref <25)
PLEASE NOTE: 0
Temazepam: NEGATIVE ng/mL (ref <50)

## 2017-06-21 ENCOUNTER — Ambulatory Visit: Payer: PPO | Admitting: Physician Assistant

## 2017-06-21 ENCOUNTER — Encounter: Payer: Self-pay | Admitting: Physician Assistant

## 2017-06-21 NOTE — Progress Notes (Signed)
Call pt: UDS showed benzo, alcohol, and marijuana, and pain medication. This is considered a failed UDS. You cannot drink and be on benzo's. YOU CAN NOT do marijuanna and have benzos or opiates. We will continue to do UDS on you and if found positive again will have to stop prescribing controlled substances. We gave you benzo taper. No alcohol/marijuana.

## 2017-12-27 ENCOUNTER — Other Ambulatory Visit: Payer: Self-pay | Admitting: Family Medicine

## 2017-12-27 DIAGNOSIS — M5416 Radiculopathy, lumbar region: Secondary | ICD-10-CM

## 2018-01-26 DIAGNOSIS — C44622 Squamous cell carcinoma of skin of right upper limb, including shoulder: Secondary | ICD-10-CM | POA: Diagnosis not present

## 2018-01-26 DIAGNOSIS — C44519 Basal cell carcinoma of skin of other part of trunk: Secondary | ICD-10-CM | POA: Diagnosis not present

## 2018-01-26 DIAGNOSIS — D485 Neoplasm of uncertain behavior of skin: Secondary | ICD-10-CM | POA: Diagnosis not present

## 2018-01-26 DIAGNOSIS — C44619 Basal cell carcinoma of skin of left upper limb, including shoulder: Secondary | ICD-10-CM | POA: Diagnosis not present

## 2018-01-26 DIAGNOSIS — C44612 Basal cell carcinoma of skin of right upper limb, including shoulder: Secondary | ICD-10-CM | POA: Diagnosis not present

## 2018-02-21 DIAGNOSIS — C44622 Squamous cell carcinoma of skin of right upper limb, including shoulder: Secondary | ICD-10-CM | POA: Diagnosis not present

## 2018-03-02 DIAGNOSIS — M25561 Pain in right knee: Secondary | ICD-10-CM | POA: Diagnosis not present

## 2018-03-02 DIAGNOSIS — M67911 Unspecified disorder of synovium and tendon, right shoulder: Secondary | ICD-10-CM | POA: Diagnosis not present

## 2018-03-02 DIAGNOSIS — M25562 Pain in left knee: Secondary | ICD-10-CM | POA: Diagnosis not present

## 2018-03-02 DIAGNOSIS — M542 Cervicalgia: Secondary | ICD-10-CM | POA: Diagnosis not present

## 2018-03-02 DIAGNOSIS — M67912 Unspecified disorder of synovium and tendon, left shoulder: Secondary | ICD-10-CM | POA: Diagnosis not present

## 2018-03-06 DIAGNOSIS — L821 Other seborrheic keratosis: Secondary | ICD-10-CM | POA: Diagnosis not present

## 2018-03-06 DIAGNOSIS — C44619 Basal cell carcinoma of skin of left upper limb, including shoulder: Secondary | ICD-10-CM | POA: Diagnosis not present

## 2018-03-06 DIAGNOSIS — C44519 Basal cell carcinoma of skin of other part of trunk: Secondary | ICD-10-CM | POA: Diagnosis not present

## 2018-03-06 DIAGNOSIS — Z85828 Personal history of other malignant neoplasm of skin: Secondary | ICD-10-CM | POA: Diagnosis not present

## 2018-03-15 DIAGNOSIS — Z85828 Personal history of other malignant neoplasm of skin: Secondary | ICD-10-CM | POA: Diagnosis not present

## 2018-03-15 DIAGNOSIS — L570 Actinic keratosis: Secondary | ICD-10-CM | POA: Diagnosis not present

## 2018-03-15 DIAGNOSIS — C44519 Basal cell carcinoma of skin of other part of trunk: Secondary | ICD-10-CM | POA: Diagnosis not present

## 2018-03-15 DIAGNOSIS — C44619 Basal cell carcinoma of skin of left upper limb, including shoulder: Secondary | ICD-10-CM | POA: Diagnosis not present

## 2018-03-15 DIAGNOSIS — D485 Neoplasm of uncertain behavior of skin: Secondary | ICD-10-CM | POA: Diagnosis not present

## 2018-03-15 DIAGNOSIS — C44529 Squamous cell carcinoma of skin of other part of trunk: Secondary | ICD-10-CM | POA: Diagnosis not present

## 2018-04-04 DIAGNOSIS — C44612 Basal cell carcinoma of skin of right upper limb, including shoulder: Secondary | ICD-10-CM | POA: Diagnosis not present

## 2018-04-04 DIAGNOSIS — Z85828 Personal history of other malignant neoplasm of skin: Secondary | ICD-10-CM | POA: Diagnosis not present

## 2018-04-06 DIAGNOSIS — M542 Cervicalgia: Secondary | ICD-10-CM | POA: Diagnosis not present

## 2018-04-06 DIAGNOSIS — M67912 Unspecified disorder of synovium and tendon, left shoulder: Secondary | ICD-10-CM | POA: Diagnosis not present

## 2018-04-06 DIAGNOSIS — M67911 Unspecified disorder of synovium and tendon, right shoulder: Secondary | ICD-10-CM | POA: Diagnosis not present

## 2018-04-23 ENCOUNTER — Other Ambulatory Visit: Payer: Self-pay | Admitting: Family Medicine

## 2018-04-23 DIAGNOSIS — M5416 Radiculopathy, lumbar region: Secondary | ICD-10-CM

## 2018-05-19 ENCOUNTER — Other Ambulatory Visit: Payer: Self-pay | Admitting: Family Medicine

## 2018-05-19 DIAGNOSIS — M5416 Radiculopathy, lumbar region: Secondary | ICD-10-CM

## 2018-06-14 DIAGNOSIS — D485 Neoplasm of uncertain behavior of skin: Secondary | ICD-10-CM | POA: Diagnosis not present

## 2018-06-14 DIAGNOSIS — L821 Other seborrheic keratosis: Secondary | ICD-10-CM | POA: Diagnosis not present

## 2018-06-14 DIAGNOSIS — C44619 Basal cell carcinoma of skin of left upper limb, including shoulder: Secondary | ICD-10-CM | POA: Diagnosis not present

## 2018-06-14 DIAGNOSIS — D0472 Carcinoma in situ of skin of left lower limb, including hip: Secondary | ICD-10-CM | POA: Diagnosis not present

## 2018-06-14 DIAGNOSIS — C44622 Squamous cell carcinoma of skin of right upper limb, including shoulder: Secondary | ICD-10-CM | POA: Diagnosis not present

## 2018-06-14 DIAGNOSIS — Z85828 Personal history of other malignant neoplasm of skin: Secondary | ICD-10-CM | POA: Diagnosis not present

## 2018-06-15 ENCOUNTER — Other Ambulatory Visit: Payer: Self-pay | Admitting: Family Medicine

## 2018-06-15 DIAGNOSIS — M5416 Radiculopathy, lumbar region: Secondary | ICD-10-CM

## 2018-07-10 ENCOUNTER — Other Ambulatory Visit: Payer: Self-pay | Admitting: Family Medicine

## 2018-07-10 DIAGNOSIS — M5416 Radiculopathy, lumbar region: Secondary | ICD-10-CM

## 2018-07-28 IMAGING — DX DG LUMBAR SPINE COMPLETE 4+V
5 series · 5 of 5 positions shown · non-contrast
Comparison: None.

CLINICAL DATA: Low back pain and sciatica, no known injury, initial
encounter

EXAM:
LUMBAR SPINE - COMPLETE 4+ VIEW

[l-spine ap]
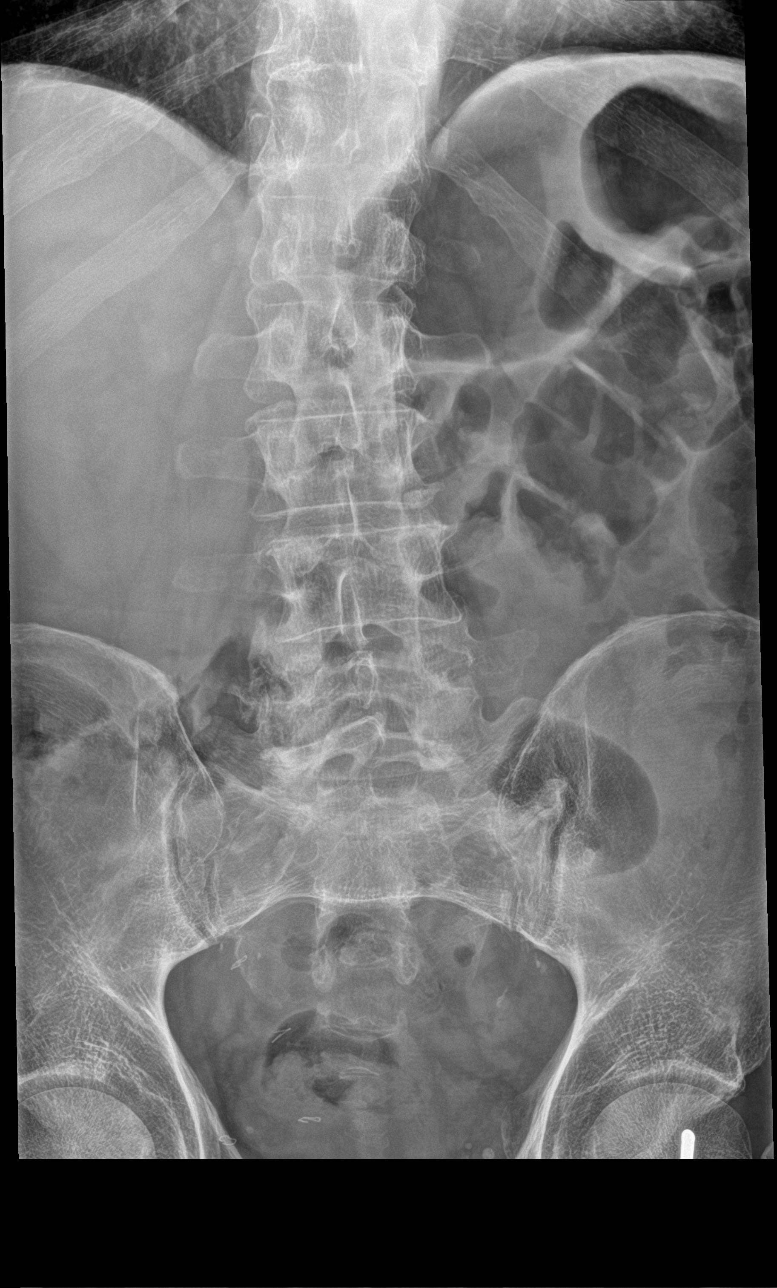

[l-spine obl (1 of 2)]
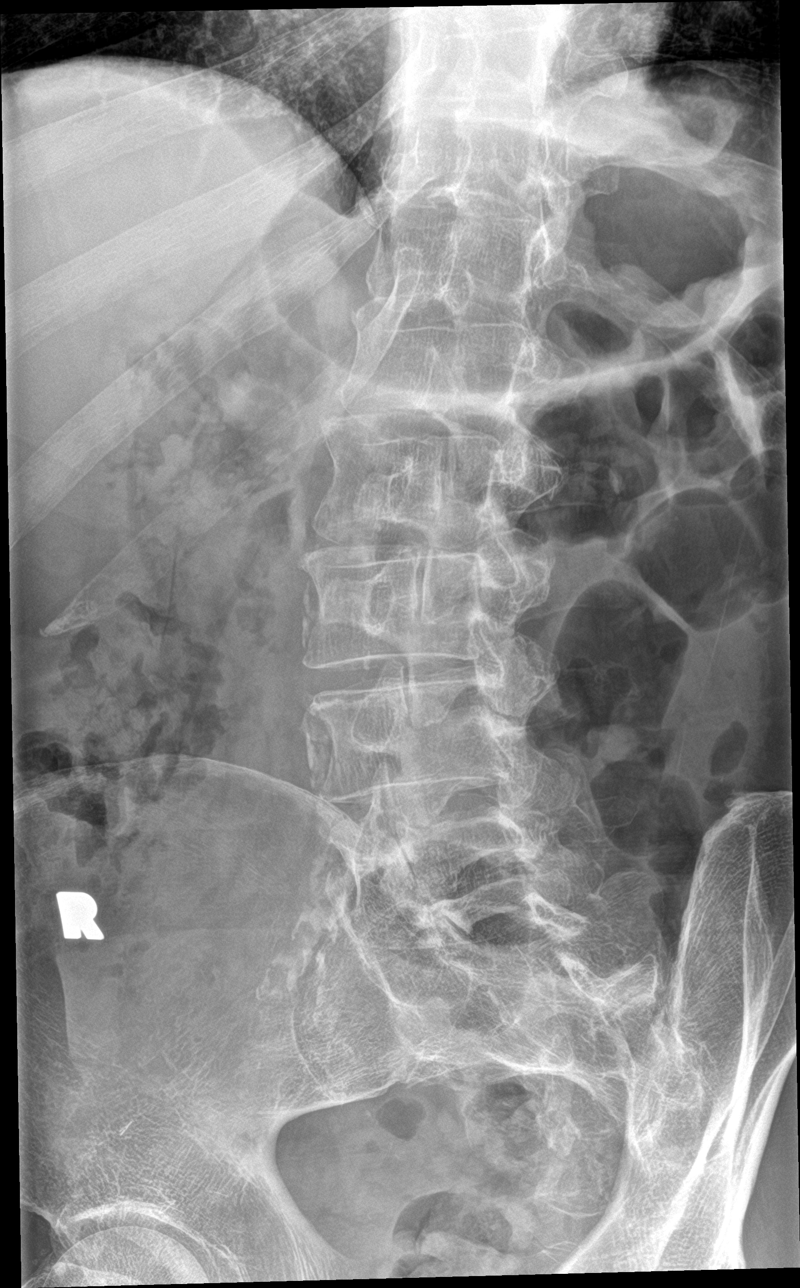

[l-spine obl (2 of 2)]
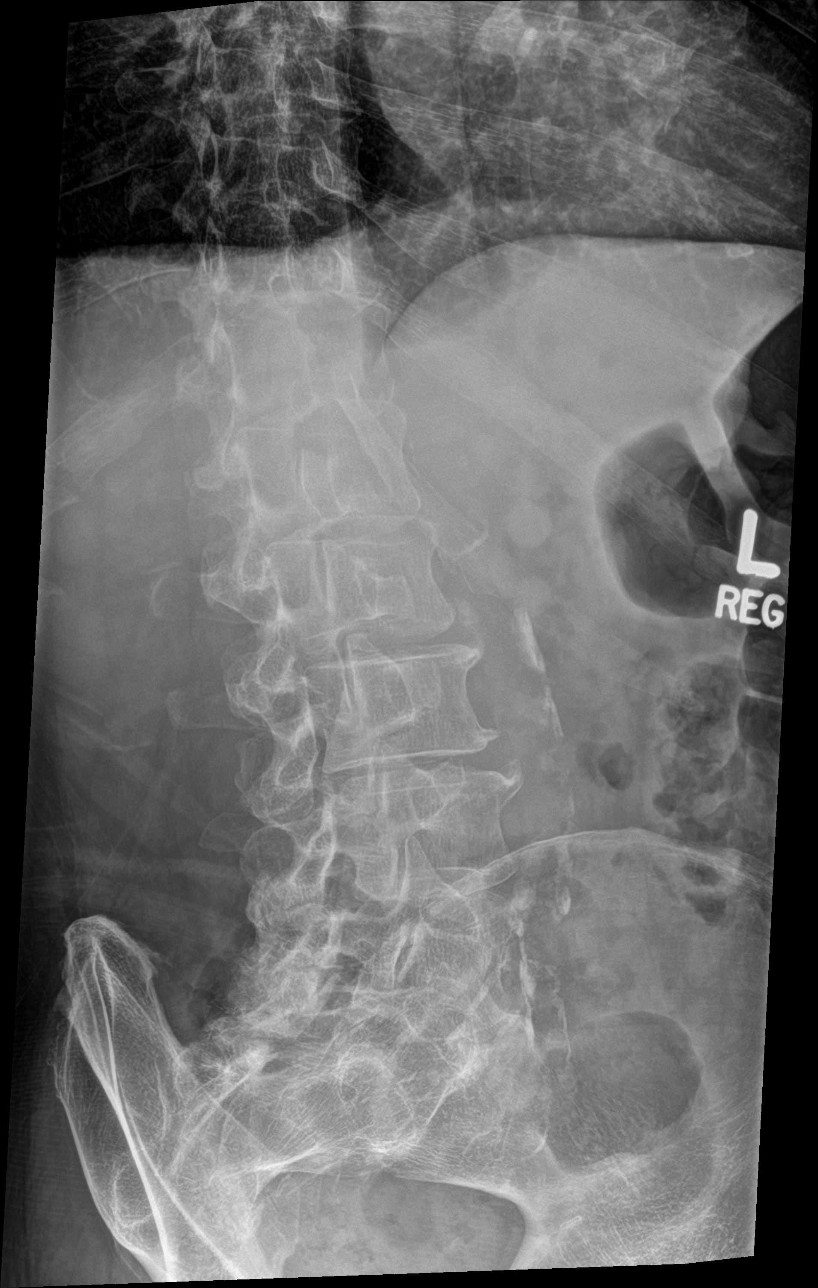

[l-spine lat]
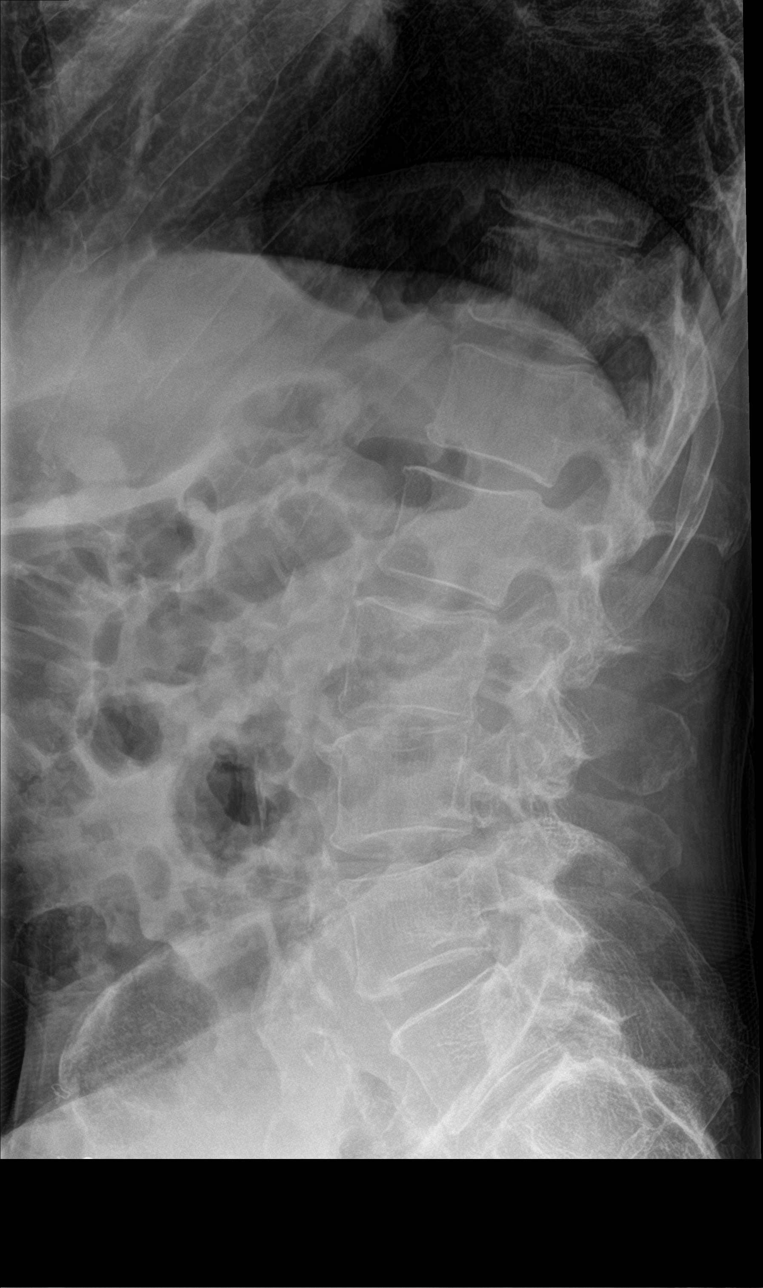

[l-spine spot]
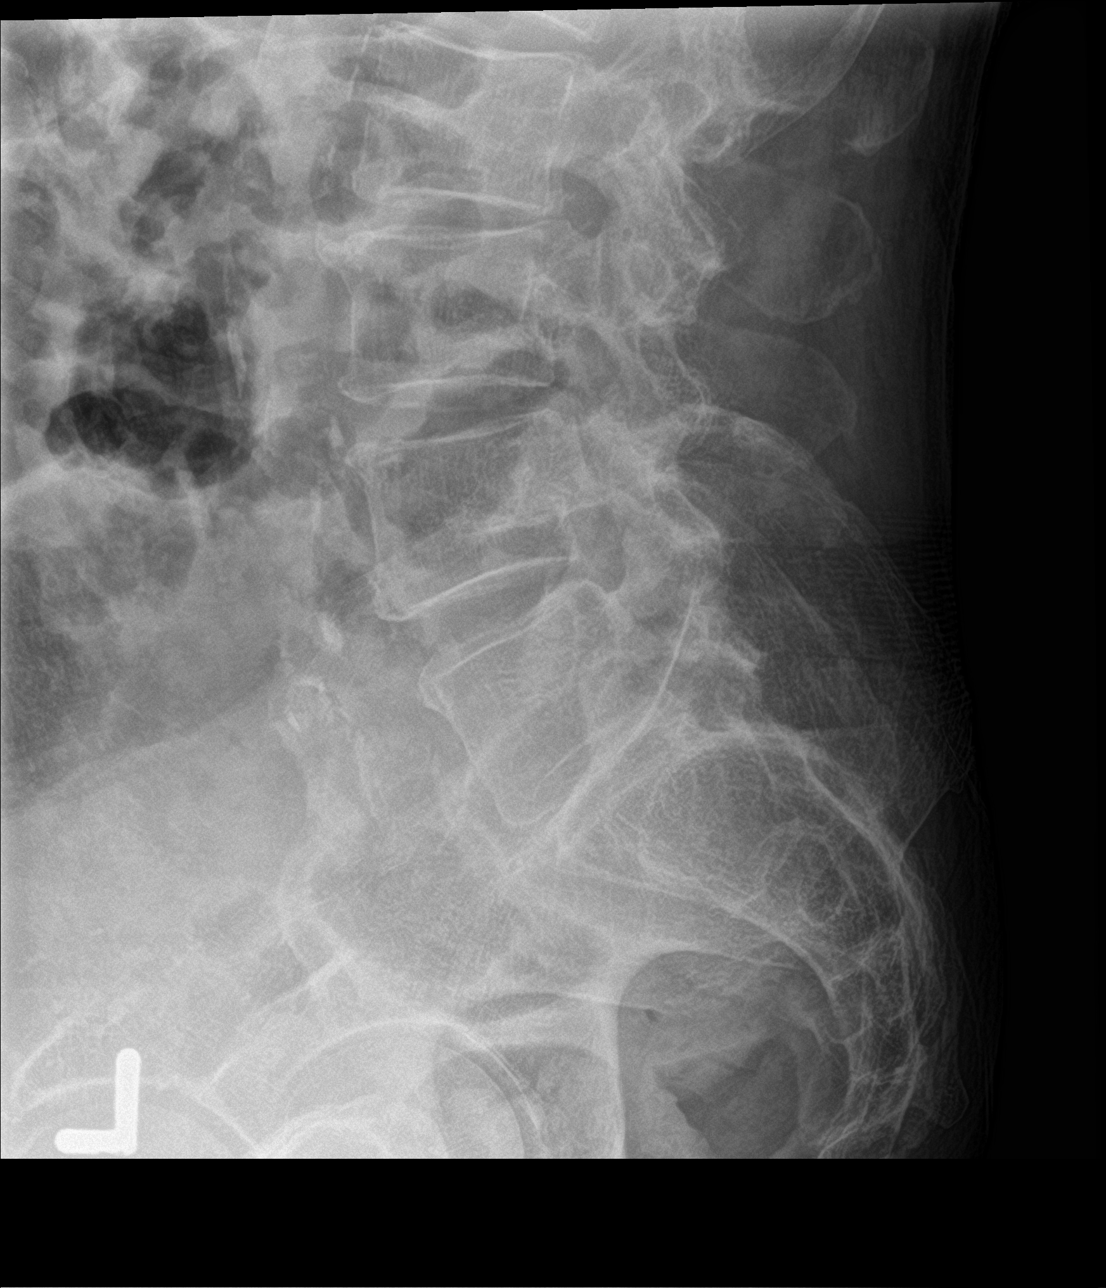

[5 of 5 positions shown; findings below may reference images not displayed]

FINDINGS: Five non rib-bearing lumbar vertebra are noted. A transitional
segment is noted as well. No pars defects are noted. Mild facet
hypertrophic changes are noted. Aortic calcifications are seen
without aneurysmal dilatation. Mild endplate osteophytes are noted.
No significant disc space narrowing is seen.
IMPRESSION: Mild degenerative change.

Transitional anatomy

## 2018-08-09 ENCOUNTER — Other Ambulatory Visit: Payer: Self-pay | Admitting: Family Medicine

## 2018-08-09 DIAGNOSIS — M5416 Radiculopathy, lumbar region: Secondary | ICD-10-CM

## 2018-09-08 ENCOUNTER — Other Ambulatory Visit: Payer: Self-pay | Admitting: Family Medicine

## 2018-09-08 DIAGNOSIS — M5416 Radiculopathy, lumbar region: Secondary | ICD-10-CM

## 2018-09-24 ENCOUNTER — Other Ambulatory Visit: Payer: Self-pay | Admitting: Family Medicine

## 2018-09-24 DIAGNOSIS — M5416 Radiculopathy, lumbar region: Secondary | ICD-10-CM

## 2018-10-09 ENCOUNTER — Other Ambulatory Visit: Payer: Self-pay | Admitting: Family Medicine

## 2018-10-09 DIAGNOSIS — M5416 Radiculopathy, lumbar region: Secondary | ICD-10-CM

## 2018-10-30 ENCOUNTER — Other Ambulatory Visit: Payer: Self-pay | Admitting: Family Medicine

## 2018-10-30 DIAGNOSIS — M5416 Radiculopathy, lumbar region: Secondary | ICD-10-CM

## 2018-10-31 DIAGNOSIS — N3281 Overactive bladder: Secondary | ICD-10-CM | POA: Diagnosis not present

## 2018-10-31 DIAGNOSIS — R358 Other polyuria: Secondary | ICD-10-CM | POA: Diagnosis not present

## 2018-11-28 ENCOUNTER — Other Ambulatory Visit: Payer: Self-pay | Admitting: Family Medicine

## 2018-11-28 DIAGNOSIS — M5416 Radiculopathy, lumbar region: Secondary | ICD-10-CM

## 2018-12-09 ENCOUNTER — Encounter: Payer: Self-pay | Admitting: Family Medicine

## 2018-12-09 ENCOUNTER — Other Ambulatory Visit: Payer: Self-pay

## 2018-12-09 ENCOUNTER — Ambulatory Visit (INDEPENDENT_AMBULATORY_CARE_PROVIDER_SITE_OTHER): Payer: PPO | Admitting: Family Medicine

## 2018-12-09 VITALS — BP 164/72 | HR 94 | Temp 98.6°F | Ht 72.0 in | Wt 178.2 lb

## 2018-12-09 DIAGNOSIS — R05 Cough: Secondary | ICD-10-CM

## 2018-12-09 DIAGNOSIS — Z09 Encounter for follow-up examination after completed treatment for conditions other than malignant neoplasm: Secondary | ICD-10-CM | POA: Diagnosis not present

## 2018-12-09 DIAGNOSIS — R6889 Other general symptoms and signs: Secondary | ICD-10-CM

## 2018-12-09 DIAGNOSIS — R059 Cough, unspecified: Secondary | ICD-10-CM

## 2018-12-09 LAB — POCT INFLUENZA A/B
Influenza A, POC: NEGATIVE
Influenza B, POC: NEGATIVE

## 2018-12-09 NOTE — Progress Notes (Signed)
Established Patient Office Visit  Subjective:  Patient ID: Edward Cuevas, male    DOB: Feb 07, 1956  Age: 63 y.o. MRN: [id number]  CC:  Chief Complaint  Patient presents with  . Generalized Body Aches    1 week  . Cough  . Nasal Congestion  . Headache    HPI Edward Cuevas is a 63 year old male who presents for Sick Visit.   Past Medical History:  Diagnosis Date  . Anxiety   . Arthritis   . Cancer ([place])    skin  . COPD (chronic obstructive pulmonary disease) ([place])    per pt ?  [name] Depression   . Hepatitis    A,B,C  . Hyperlipidemia   . Hypertension   . Prediabetes   . Vitamin D deficiency    Current Status: Since his last office visit, he has been having flu-like symptoms X 1 week. He is having body aches, increasing coughing, headache, shortness of breath, and fatigue. He is currently taking Acetaminophen with no relief.   He denies fevers, chills, recent infections, weight loss, and night sweats. He has not had any headaches, visual changes, dizziness, and falls. No chest pain, heart palpitations reported. No reports of GI problems such as nausea, vomiting, diarrhea, and constipation. He has no reports of blood in stools, dysuria and hematuria. No depression or anxiety reported. He reports moderate chronic pain today.   Past Surgical History:  Procedure Laterality Date  . ELBOW SURGERY Right 1986  . [place]  . HERNIA REPAIR Left 1996  . INGUINAL HERNIA REPAIR Right 12/30/2015   Procedure: RIGHT RIGHT INGUINAL HERNIA REPAIR;  Surgeon: Ralene Ok, MD;  Location: [facility];  Service: General;  Laterality: Right;  . INSERTION OF MESH Right 12/30/2015   Procedure: INSERTION OF MESH;  Surgeon: Ralene Ok, MD;  Location: [facility];  Service: General;  Laterality: Right;  . SHOULDER SURGERY Left 2007    Family History  Problem Relation Age of Onset  . Diabetes Mother   . Heart disease Father   . Cancer Father   . Heart disease Maternal Grandmother      Social History   Socioeconomic History  . Marital status: Single    Spouse name: Not on file  . Number of children: Not on file  . Years of education: Not on file  . Highest education level: Not on file  Occupational History  . Not on file  Social Needs  . Financial resource strain: Not on file  . Food insecurity:    Worry: Not on file    Inability: Not on file  . Transportation needs:    Medical: Not on file    Non-medical: Not on file  Tobacco Use  . Smoking status: Current Every Day Smoker    Packs/day: 1.50    Years: 3.00    Pack years: 4.50    Types: Cigarettes  . Smokeless tobacco: Never Used  Substance and Sexual Activity  . Alcohol use: Yes    Alcohol/week: 16.0 standard drinks    Types: 4 Cans of beer, 12 Standard drinks or equivalent per week    Comment: Beer on occasion  . Drug use: No  . Sexual activity: Not Currently  Lifestyle  . Physical activity:    Days per week: Not on file    Minutes per session: Not on file  . Stress: Not on file  Relationships  . Social connections:    Talks on phone: Not  on file    Gets together: Not on file    Attends religious service: Not on file    Active member of club or organization: Not on file    Attends meetings of clubs or organizations: Not on file    Relationship status: Not on file  . Intimate partner violence:    Fear of current or ex partner: Not on file    Emotionally abused: Not on file    Physically abused: Not on file    Forced sexual activity: Not on file  Other Topics Concern  . Not on file  Social History Narrative  . Not on file    Outpatient Medications Prior to Visit  Medication Sig Dispense Refill  . albuterol (PROAIR HFA) 108 (90 Base) MCG/ACT inhaler Inhale 1-2 puffs into the lungs every 6 (six) hours as needed for wheezing or shortness of breath. 8 g 2  . gabapentin (NEURONTIN) 300 MG capsule TAKE ONE CAPSULE BY MOUTH AT BEDTIME FOR 1 WEEK, TWICE DAILY FOR 1 WEEK, THEN THREE TIMES  DAILY. MAY DOUBLE FOR A MAX OF 3600MG  PER DAY APPOINTMENT NEEDED FOR FURTHER REFILLS 90 capsule 0  . meloxicam (MOBIC) 15 MG tablet TK 1 T PO D WITH MEALS    . MYRBETRIQ 50 MG TB24 tablet TK 1 T PO QD    . acetaminophen-codeine (TYLENOL #4) 300-60 MG tablet TAKE 1/2 TO 1 (ONE-HALF TO ONE) TABLET BY MOUTH THREE TIMES DAILY AS NEEDED FOR  MODERATE  PAIN 90 tablet 0  . clonazePAM (KLONOPIN) 0.5 MG tablet Take by mouth.    [name] FLUoxetine (PROZAC) 40 MG capsule TAKE ONE CAPSULE BY MOUTH TWICE DAILY 60 capsule 5  . Cholecalciferol (VITAMIN D PO) Take 5,000 Units by mouth daily. Takes 5000 units daily    . clonazePAM (KLONOPIN) 0.5 MG tablet 1 tab in AM and 1/2 tab in PM x 10 days, Then 1/2 tab AM and PM x 10 days,  Then 1/2 tab QD x 10 days Then 1/2 tab every other day x 10 days. 33 tablet 0  . diclofenac (VOLTAREN) 75 MG EC tablet Take 1 tablet (75 mg total) by mouth 2 (two) times daily. 60 tablet 5  . fluorouracil (EFUDEX) 5 % cream Apply topically 2 (two) times daily. 40 g 1  . hydrocortisone (ANUSOL-HC) 2.5 % rectal cream Place 1 application rectally 2 (two) times daily. 60 g 2  . polyethylene glycol (MIRALAX / GLYCOLAX) packet Take 17 g by mouth 2 (two) times daily. Until stooling regularly 30 packet 2   No facility-administered medications prior to visit.     Allergies  Allergen Reactions  . Penicillins Itching and Rash    Has patient had a PCN reaction causing immediate rash, facial/tongue/throat swelling, SOB or lightheadedness with hypotension: Yes Has patient had a PCN reaction causing severe rash involving mucus membranes or skin necrosis: No Has patient had a PCN reaction that required hospitalization No Has patient had a PCN reaction occurring within the last 10 years: No If all of the above answers are "NO", then may proceed with Cephalosporin use.     ROS Review of Systems  Constitutional: Positive for chills and fatigue.  HENT: Positive for congestion.        Chest   Eyes:  Negative.   Respiratory: Positive for cough and shortness of breath.   Cardiovascular: Negative.   Gastrointestinal: Negative.   Endocrine: Negative.   Genitourinary: Negative.   Musculoskeletal: Positive for arthralgias (chronic shoulder pain).  Skin: Negative.   Allergic/Immunologic: Negative.   Neurological: Positive for headaches.  Hematological: Negative.   Psychiatric/Behavioral: Negative.    Objective:    Physical Exam  Constitutional: He is oriented to person, place, and time. He appears well-developed and well-nourished.  Eyes: Conjunctivae are normal.  Neck: Normal range of motion. Neck supple.  Cardiovascular: Normal rate, regular rhythm, normal heart sounds and intact distal pulses.  Pulmonary/Chest: Effort normal and breath sounds normal.  Abdominal: Soft. Bowel sounds are normal.  Musculoskeletal: Normal range of motion.  Neurological: He is alert and oriented to person, place, and time. He has normal reflexes.  Skin: Skin is warm and dry.  Psychiatric: He has a normal mood and affect. His behavior is normal. Judgment and thought content normal.  Nursing note and vitals reviewed.   BP (!) 164/72 (BP Location: Right Arm, Patient Position: Sitting, Cuff Size: Normal)   Pulse 94   Temp 98.6 F (37 C) (Oral)   Ht 6' (1.829 m)   Wt 178 lb 3.2 oz (80.8 kg)   SpO2 95%   BMI 24.17 kg/m  Wt Readings from Last 3 Encounters:  12/09/18 178 lb 3.2 oz (80.8 kg)  06/01/17 153 lb (69.4 kg)  02/21/17 156 lb (70.8 kg)    Health Maintenance Due  Topic Date Due  . INFLUENZA VACCINE  05/18/2018    There are no preventive care reminders to display for this patient.  Lab Results  Component Value Date   TSH 1.537 09/19/2014   Lab Results  Component Value Date   WBC 6.0 12/26/2015   HGB 15.8 12/26/2015   HCT 45.3 12/26/2015   MCV 91.9 12/26/2015   PLT 163 12/26/2015   Lab Results  Component Value Date   NA 136 08/24/2016   K 4.7 08/24/2016   CO2 25 08/24/2016     GLUCOSE 109 (H) 08/24/2016   BUN 7 08/24/2016   CREATININE 0.81 08/24/2016   BILITOT 0.6 08/24/2016   ALKPHOS 46 08/24/2016   AST 40 (H) 08/24/2016   ALT 37 08/24/2016   PROT 7.0 08/24/2016   ALBUMIN 4.4 08/24/2016   CALCIUM 9.1 08/24/2016   ANIONGAP 11 12/26/2015   Lab Results  Component Value Date   CHOL 166 08/24/2016   Lab Results  Component Value Date   HDL 58 08/24/2016   Lab Results  Component Value Date   LDLCALC 95 08/24/2016   Lab Results  Component Value Date   TRIG 63 08/24/2016   Lab Results  Component Value Date   CHOLHDL 2.9 08/24/2016   Lab Results  Component Value Date   HGBA1C 5.8 (H) 09/19/2014   Assessment & Plan:   1. Flu-like symptoms Test for Influenza is negative. No respiratory distress noted or reported. He will use OTC Cold/Cough medications as directed, increase fluids, and get plenty of rest to aide in symptoms. He will report to office if symptoms do not improve or worsen.  - POCT Influenza A/B  2. Cough Stable. He will continue OTC cough medication as directed.   3. Follow up He will keep follow up appointment on 01/03/2019.  No orders of the defined types were placed in this encounter.   Orders Placed This Encounter  Procedures  . POCT Influenza A/B    Referral Orders  No referral(s) requested today    Kathe Becton,  MSN, FNP-C [facility], [place] [address] [phone]  Problem List Items Addressed This Visit  None    Visit Diagnoses    Flu-like symptoms    -  Primary   Relevant Orders   POCT Influenza A/B (Completed)   Cough       Follow up          No orders of the defined types were placed in this encounter.   Follow-up: No follow-ups on file.    Azzie Glatter, FNP

## 2018-12-09 NOTE — Patient Instructions (Addendum)
If you have lab work done today you will be contacted with your lab results within the next 2 weeks.  If you have not heard from [place] then please contact us. The fastest way to get your results is to register for My Chart.   IF you received an x-ray today, you will receive an invoice from [facility]. Please contact [facility] at [phone] with questions or concerns regarding your invoice.   IF you received labwork today, you will receive an invoice from [facility]. Please contact [facility] at [phone] with questions or concerns regarding your invoice.   Our billing staff will not be able to assist you with questions regarding bills from these companies.  You will be contacted with the lab results as soon as they are available. The fastest way to get your results is to activate your My Chart account. Instructions are located on the last page of this paperwork. If you have not heard from [place] regarding the results in 2 weeks, please contact this office.        Rehydration, Adult Rehydration is the replacement of body fluids and salts and minerals (electrolytes) that are lost during dehydration. Dehydration is when there is not enough fluid or water in the body. This happens when you lose more fluids than you take in. Common causes of dehydration include:  Vomiting.  Diarrhea.  Excessive sweating, such as from heat exposure or exercise.  Taking medicines that cause the body to lose excess fluid (diuretics).  Impaired kidney function.  Not drinking enough fluid.  Certain illnesses or infections.  Certain poorly controlled long-term (chronic) illnesses, such as diabetes, heart disease, and kidney disease.  Symptoms of mild dehydration may include thirst, dry lips and mouth, dry skin, and dizziness. Symptoms of severe dehydration may include increased heart rate, confusion, fainting, and not urinating. You can rehydrate by drinking certain fluids or getting  fluids through an IV tube, as told by your health care provider. What are the risks? Generally, rehydration is safe. However, one problem that can happen is taking in too much fluid (overhydration). This is rare. If overhydration happens, it can cause an electrolyte imbalance, kidney failure, or a decrease in salt (sodium) levels in the body. How to rehydrate Follow instructions from your health care provider for rehydration. The kind of fluid you should drink and the amount you should drink depend on your condition.  If directed by your health care provider, drink an oral rehydration solution (ORS). This is a drink designed to treat dehydration that is found in pharmacies and retail stores. ? Make an ORS by following instructions on the package. ? Start by drinking small amounts, about  cup (120 mL) every 5-10 minutes. ? Slowly increase how much you drink until you have taken the amount recommended by your health care provider.  Drink enough clear fluids to keep your urine clear or pale yellow. If you were instructed to drink an ORS, finish the ORS first, then start slowly drinking other clear fluids. Drink fluids such as: ? Water. Do not drink only water. Doing that can lead to having too little sodium in your body (hyponatremia). ? Ice chips. ? Fruit juice that you have added water to (diluted juice). ? Low-calorie sports drinks.  If you are severely dehydrated, your health care provider may recommend that you receive fluids through an IV tube in the hospital.  Do not take sodium tablets. Doing that can lead to the condition of having too  much sodium in your body (hypernatremia). Eating while you rehydrate Follow instructions from your health care provider about what to eat while you rehydrate. Your health care provider may recommend that you slowly begin eating regular foods in small amounts.  Eat foods that contain a healthy balance of electrolytes, such as bananas, oranges, potatoes,  tomatoes, and spinach.  Avoid foods that are greasy or contain a lot of fat or sugar.  In some cases, you may get nutrition through a feeding tube that is passed through your nose and into your stomach (nasogastric tube, or NG tube). This may be done if you have uncontrolled vomiting or diarrhea. Beverages to avoid Certain beverages may make dehydration worse. While you rehydrate, avoid:  Alcohol.  Caffeine.  Drinks that contain a lot of sugar. These include: ? High-calorie sports drinks. ? Fruit juice that is not diluted. ? Soda.  Check nutrition labels to see how much sugar or caffeine a beverage contains. Signs of dehydration recovery You may be recovering from dehydration if:  You are urinating more often than before you started rehydrating.  Your urine is clear or pale yellow.  Your energy level improves.  You vomit less frequently.  You have diarrhea less frequently.  Your appetite improves or returns to normal.  You feel less dizzy or less light-headed.  Your skin tone and color start to look more normal. Contact a health care provider if:  You continue to have symptoms of mild dehydration, such as: ? Thirst. ? Dry lips. ? Slightly dry mouth. ? Dry, warm skin. ? Dizziness.  You continue to vomit or have diarrhea. Get help right away if:  You have symptoms of dehydration that get worse.  You feel: ? Confused. ? Weak. ? Like you are going to faint.  You have not urinated in 6-8 hours.  You have very dark urine.  You have trouble breathing.  Your heart rate while sitting still is over 100 beats a minute.  You cannot drink fluids without vomiting.  You have vomiting or diarrhea that: ? Gets worse. ? Does not go away.  You have a fever. This information is not intended to replace advice given to you by your health care provider. Make sure you discuss any questions you have with your health care provider. Document Released: 12/27/2011 Document  Revised: 04/23/2016 Document Reviewed: 11/28/2015 Elsevier Interactive Patient Education  2019 [employer] Cough, Adult  A cough helps to clear your throat and lungs. A cough may last only 2-3 weeks (acute), or it may last longer than 8 weeks (chronic). Many different things can cause a cough. A cough may be a sign of an illness or another medical condition. Follow these instructions at home:  Pay attention to any changes in your cough.  Take medicines only as told by your doctor. ? If you were prescribed an antibiotic medicine, take it as told by your doctor. Do not stop taking it even if you start to feel better. ? Talk with your doctor before you try using a cough medicine.  Drink enough fluid to keep your pee (urine) clear or pale yellow.  If the air is dry, use a cold steam vaporizer or humidifier in your home.  Stay away from things that make you cough at work or at home.  If your cough is worse at night, try using extra pillows to raise your head up higher while you sleep.  Do not smoke, and try not to be around smoke. If you  need help quitting, ask your doctor.  Do not have caffeine.  Do not drink alcohol.  Rest as needed. Contact a doctor if:  You have new problems (symptoms).  You cough up yellow fluid (pus).  Your cough does not get better after 2-3 weeks, or your cough gets worse.  Medicine does not help your cough and you are not sleeping well.  You have pain that gets worse or pain that is not helped with medicine.  You have a fever.  You are losing weight and you do not know why.  You have night sweats. Get help right away if:  You cough up blood.  You have trouble breathing.  Your heartbeat is very fast. This information is not intended to replace advice given to you by your health care provider. Make sure you discuss any questions you have with your health care provider. Document Released: 06/17/2011 Document Revised: 03/11/2016 Document  Reviewed: 12/11/2014 Elsevier Interactive Patient Education  2019 [employer]. Viral Respiratory Infection A viral respiratory infection is an illness that affects parts of the body that are used for breathing. These include the lungs, nose, and throat. It is caused by a germ called a virus. Some examples of this kind of infection are:  A cold.  The flu (influenza).  A respiratory syncytial virus (RSV) infection. A person who gets this illness may have the following symptoms:  A stuffy or runny nose.  Yellow or green fluid in the nose.  A cough.  Sneezing.  Tiredness (fatigue).  Achy muscles.  A sore throat.  Sweating or chills.  A fever.  A headache. Follow these instructions at home: Managing pain and congestion  Take over-the-counter and prescription medicines only as told by your doctor.  If you have a sore throat, gargle with salt water. Do this 3-4 times per day or as needed. To make a salt-water mixture, dissolve -1 tsp of salt in 1 cup of warm water. Make sure that all the salt dissolves.  Use nose drops made from salt water. This helps with stuffiness (congestion). It also helps soften the skin around your nose.  Drink enough fluid to keep your pee (urine) pale yellow. General instructions   Rest as much as possible.  Do not drink alcohol.  Do not use any products that have nicotine or tobacco, such as cigarettes and e-cigarettes. If you need help quitting, ask your doctor.  Keep all follow-up visits as told by your doctor. This is important. How is this prevented?   Get a flu shot every year. Ask your doctor when you should get your flu shot.  Do not let other people get your germs. If you are sick: ? Stay home from work or school. ? Wash your hands with soap and water often. Wash your hands after you cough or sneeze. If soap and water are not available, use hand sanitizer.  Avoid contact with people who are sick during cold and flu season.  This is in fall and winter. Get help if:  Your symptoms last for 10 days or longer.  Your symptoms get worse over time.  You have a fever.  You have very bad pain in your face or forehead.  Parts of your jaw or neck become very swollen. Get help right away if:  You feel pain or pressure in your chest.  You have shortness of breath.  You faint or feel like you will faint.  You keep throwing up (vomiting).  You feel confused. Summary  A viral respiratory infection is an illness that affects parts of the body that are used for breathing.  Examples of this illness include a cold, the flu, and respiratory syncytial virus (RSV) infection.  The infection can cause a runny nose, cough, sneezing, sore throat, and fever.  Follow what your doctor tells you about taking medicines, drinking lots of fluid, washing your hands, resting at home, and avoiding people who are sick. This information is not intended to replace advice given to you by your health care provider. Make sure you discuss any questions you have with your health care provider. Document Released: 09/16/2008 Document Revised: 11/14/2017 Document Reviewed: 11/14/2017 Elsevier Interactive Patient Education  2019 [employer].

## 2018-12-15 ENCOUNTER — Ambulatory Visit (INDEPENDENT_AMBULATORY_CARE_PROVIDER_SITE_OTHER): Payer: PPO | Admitting: Emergency Medicine

## 2018-12-15 ENCOUNTER — Ambulatory Visit (INDEPENDENT_AMBULATORY_CARE_PROVIDER_SITE_OTHER): Payer: PPO

## 2018-12-15 ENCOUNTER — Other Ambulatory Visit: Payer: Self-pay

## 2018-12-15 ENCOUNTER — Encounter: Payer: Self-pay | Admitting: Emergency Medicine

## 2018-12-15 VITALS — BP 126/74 | HR 75 | Temp 98.2°F | Resp 18 | Ht 72.0 in | Wt 175.2 lb

## 2018-12-15 DIAGNOSIS — J22 Unspecified acute lower respiratory infection: Secondary | ICD-10-CM | POA: Diagnosis not present

## 2018-12-15 DIAGNOSIS — R05 Cough: Secondary | ICD-10-CM

## 2018-12-15 DIAGNOSIS — R059 Cough, unspecified: Secondary | ICD-10-CM

## 2018-12-15 DIAGNOSIS — J441 Chronic obstructive pulmonary disease with (acute) exacerbation: Secondary | ICD-10-CM | POA: Diagnosis not present

## 2018-12-15 MED ORDER — PREDNISONE 20 MG PO TABS: 40.0000 mg | ORAL_TABLET | Freq: Every day | ORAL | 0 refills | Status: AC

## 2018-12-15 MED ORDER — BENZONATATE 200 MG PO CAPS: 200.0000 mg | ORAL_CAPSULE | Freq: Two times a day (BID) | ORAL | 0 refills | Status: DC | PRN

## 2018-12-15 MED ORDER — AZITHROMYCIN 250 MG PO TABS: ORAL_TABLET | ORAL | 0 refills | Status: DC

## 2018-12-15 NOTE — Patient Instructions (Addendum)
     If you have lab work done today you will be contacted with your lab results within the next 2 weeks.  If you have not heard from us then please contact us. The fastest way to get your results is to register for My Chart.   IF you received an x-ray today, you will receive an invoice from [facility]. Please contact [facility] at [phone] with questions or concerns regarding your invoice.   IF you received labwork today, you will receive an invoice from [facility]. Please contact [facility] at [phone] with questions or concerns regarding your invoice.   Our billing staff will not be able to assist you with questions regarding bills from these companies.  You will be contacted with the lab results as soon as they are available. The fastest way to get your results is to activate your My Chart account. Instructions are located on the last page of this paperwork. If you have not heard from us regarding the results in 2 weeks, please contact this office.     Cough, Adult  A cough helps to clear your throat and lungs. A cough may last only 2-3 weeks (acute), or it may last longer than 8 weeks (chronic). Many different things can cause a cough. A cough may be a sign of an illness or another medical condition. Follow these instructions at home:  Pay attention to any changes in your cough.  Take medicines only as told by your doctor. ? If you were prescribed an antibiotic medicine, take it as told by your doctor. Do not stop taking it even if you start to feel better. ? Talk with your doctor before you try using a cough medicine.  Drink enough fluid to keep your pee (urine) clear or pale yellow.  If the air is dry, use a cold steam vaporizer or humidifier in your home.  Stay away from things that make you cough at work or at home.  If your cough is worse at night, try using extra pillows to raise your head up higher while you sleep.  Do not smoke, and try not to  be around smoke. If you need help quitting, ask your doctor.  Do not have caffeine.  Do not drink alcohol.  Rest as needed. Contact a doctor if:  You have new problems (symptoms).  You cough up yellow fluid (pus).  Your cough does not get better after 2-3 weeks, or your cough gets worse.  Medicine does not help your cough and you are not sleeping well.  You have pain that gets worse or pain that is not helped with medicine.  You have a fever.  You are losing weight and you do not know why.  You have night sweats. Get help right away if:  You cough up blood.  You have trouble breathing.  Your heartbeat is very fast. This information is not intended to replace advice given to you by your health care provider. Make sure you discuss any questions you have with your health care provider. Document Released: 06/17/2011 Document Revised: 03/11/2016 Document Reviewed: 12/11/2014 Elsevier Interactive Patient Education  2019 [employer]  

## 2018-12-15 NOTE — Progress Notes (Signed)
Cough

## 2018-12-15 NOTE — Progress Notes (Signed)
Edward Cuevas 63 y.o.   Chief Complaint  Patient presents with  . Cough    would like an xray     HISTORY OF PRESENT ILLNESS: This is a 62 y.o. male sick for 2 weeks complaining of productive cough.  No other significant symptoms. Patient Active Problem List   Diagnosis Date Noted  . Lumbar radiculopathy 01/18/2017  . COPD (chronic obstructive pulmonary disease) ([place]) 12/29/2016  . Fatty liver 10/01/2016  . BPH without obstruction/lower urinary tract symptoms 11/25/2015  . Chronic pain of multiple joints 11/25/2015  . Actinic keratosis 06/27/2015  . GAD (generalized anxiety disorder) 11/01/2014  . History of hepatitis C 10/08/2014  . Right inguinal hernia 10/08/2014  . Panic attacks 10/04/2014  . DJD 10/23/2013  . DDD (degenerative disc disease), lumbosacral 10/23/2013  . Hyperlipidemia   . Hypertension   . Prediabetes   . Vitamin D deficiency     HPI   Prior to Admission medications   Medication Sig Start Date End Date Taking? Authorizing Provider  albuterol (PROAIR HFA) 108 (90 Base) MCG/ACT inhaler Inhale 1-2 puffs into the lungs every 6 (six) hours as needed for wheezing or shortness of breath. 06/08/17  Yes Breeback, Jade L, PA-C  gabapentin (NEURONTIN) 300 MG capsule TAKE ONE CAPSULE BY MOUTH AT BEDTIME FOR 1 WEEK, TWICE DAILY FOR 1 WEEK, THEN THREE TIMES DAILY. MAY DOUBLE FOR A MAX OF 3600MG  PER DAY APPOINTMENT NEEDED FOR FURTHER REFILLS 11/28/18  Yes Gregor Hams, MD  meloxicam (MOBIC) 15 MG tablet TK 1 T PO D WITH MEALS 11/27/18  Yes [provider]  MYRBETRIQ 50 MG TB24 tablet TK 1 T PO QD 11/24/18  Yes [provider]    Allergies  Allergen Reactions  . Penicillins Itching and Rash    Has patient had a PCN reaction causing immediate rash, facial/tongue/throat swelling, SOB or lightheadedness with hypotension: Yes Has patient had a PCN reaction causing severe rash involving mucus membranes or skin necrosis: No Has patient had a PCN reaction that  required hospitalization No Has patient had a PCN reaction occurring within the last 10 years: No If all of the above answers are "NO", then may proceed with Cephalosporin use.     Patient Active Problem List   Diagnosis Date Noted  . Right ankle pain 02/21/2017  . Lumbar radiculopathy 01/18/2017  . Urinary frequency 12/29/2016  . Routine physical examination 12/29/2016  . COPD (chronic obstructive pulmonary disease) ([place]) 12/29/2016  . Fatty liver 10/01/2016  . BPH without obstruction/lower urinary tract symptoms 11/25/2015  . Chronic pain of multiple joints 11/25/2015  . Actinic keratosis 06/27/2015  . GAD (generalized anxiety disorder) 11/01/2014  . History of hepatitis C 10/08/2014  . Right inguinal hernia 10/08/2014  . Panic attacks 10/04/2014  . Encounter for long-term (current) use of other medications 04/08/2014  . DJD 10/23/2013  . DDD (degenerative disc disease), lumbosacral 10/23/2013  . Hyperlipidemia   . Hypertension   . Prediabetes   . Vitamin D deficiency     Past Medical History:  Diagnosis Date  . Anxiety   . Arthritis   . Cancer ([place])    skin  . COPD (chronic obstructive pulmonary disease) ([place])    per pt ?  [name] Depression   . Hepatitis    A,B,C  . Hyperlipidemia   . Hypertension   . Prediabetes   . Vitamin D deficiency     Past Surgical History:  Procedure Laterality Date  . ELBOW SURGERY Right 1986  .  [place]  . HERNIA REPAIR Left 1996  . INGUINAL HERNIA REPAIR Right 12/30/2015   Procedure: RIGHT RIGHT INGUINAL HERNIA REPAIR;  Surgeon: Ralene Ok, MD;  Location: [facility];  Service: General;  Laterality: Right;  . INSERTION OF MESH Right 12/30/2015   Procedure: INSERTION OF MESH;  Surgeon: Ralene Ok, MD;  Location: [facility];  Service: General;  Laterality: Right;  . SHOULDER SURGERY Left 2007    Social History   Socioeconomic History  . Marital status: Single    Spouse name: Not on file  . Number of  children: Not on file  . Years of education: Not on file  . Highest education level: Not on file  Occupational History  . Not on file  Social Needs  . Financial resource strain: Not on file  . Food insecurity:    Worry: Not on file    Inability: Not on file  . Transportation needs:    Medical: Not on file    Non-medical: Not on file  Tobacco Use  . Smoking status: Current Every Day Smoker    Packs/day: 1.50    Years: 3.00    Pack years: 4.50    Types: Cigarettes  . Smokeless tobacco: Never Used  Substance and Sexual Activity  . Alcohol use: Yes    Alcohol/week: 16.0 standard drinks    Types: 4 Cans of beer, 12 Standard drinks or equivalent per week    Comment: Beer on occasion  . Drug use: No  . Sexual activity: Not Currently  Lifestyle  . Physical activity:    Days per week: Not on file    Minutes per session: Not on file  . Stress: Not on file  Relationships  . Social connections:    Talks on phone: Not on file    Gets together: Not on file    Attends religious service: Not on file    Active member of club or organization: Not on file    Attends meetings of clubs or organizations: Not on file    Relationship status: Not on file  . Intimate partner violence:    Fear of current or ex partner: Not on file    Emotionally abused: Not on file    Physically abused: Not on file    Forced sexual activity: Not on file  Other Topics Concern  . Not on file  Social History Narrative  . Not on file    Family History  Problem Relation Age of Onset  . Diabetes Mother   . Heart disease Father   . Cancer Father   . Heart disease Maternal Grandmother      Review of Systems  Constitutional: Negative.  Negative for chills and fever.  HENT: Negative.  Negative for congestion, nosebleeds, sinus pain and sore throat.   Eyes: Negative.   Respiratory: Positive for cough.   Cardiovascular: Negative.  Negative for chest pain and palpitations.  Gastrointestinal: Negative.   Negative for abdominal pain, diarrhea, nausea and vomiting.  Genitourinary: Negative.  Negative for dysuria.  Musculoskeletal: Negative.   Skin: Negative.  Negative for rash.  Neurological: Negative for dizziness and headaches.  All other systems reviewed and are negative.   Vitals:   12/15/18 1357  BP: 126/74  Pulse: 75  Resp: 18  Temp: 98.2 F (36.8 C)  SpO2: 91%    Physical Exam Vitals signs reviewed.  Constitutional:      Appearance: Normal appearance.  HENT:     Head:  Normocephalic and atraumatic.     Mouth/Throat:     Mouth: Mucous membranes are moist.     Pharynx: Oropharynx is clear.  Neck:     Musculoskeletal: Normal range of motion and neck supple.  Cardiovascular:     Rate and Rhythm: Normal rate and regular rhythm.     Heart sounds: Normal heart sounds.  Pulmonary:     Effort: Pulmonary effort is normal.     Breath sounds: Rhonchi present. No wheezing or rales.  Musculoskeletal: Normal range of motion.  Skin:    General: Skin is warm and dry.     Capillary Refill: Capillary refill takes less than 2 seconds.  Neurological:     General: No focal deficit present.     Mental Status: He is alert and oriented to person, place, and time.    Dg Chest 2 View  Result Date: 12/15/2018 CLINICAL DATA:  Cough.  History of smoking. EXAM: CHEST - 2 VIEW COMPARISON:  None. FINDINGS: Cardiac silhouette is normal in size. No mediastinal or hilar masses. No evidence of adenopathy. Lungs are hyperexpanded. Lungs are clear. No pleural effusion or pneumothorax. Old healed left rib fractures.  No acute skeletal abnormality. IMPRESSION: 1. No acute cardiopulmonary disease. Hyperexpanded lungs suggests COPD. Electronically Signed   By: Lajean Manes M.D.   On: 12/15/2018 14:31     ASSESSMENT & PLAN: Tj was seen today for cough.  Diagnoses and all orders for this visit:  Cough -     DG Chest 2 View; Future -     benzonatate (TESSALON) 200 MG capsule; Take 1 capsule (200 mg  total) by mouth 2 (two) times daily as needed for cough.  Lower respiratory infection -     azithromycin (ZITHROMAX) 250 MG tablet; Sig as indicated  COPD exacerbation (HCC) -     predniSONE (DELTASONE) 20 MG tablet; Take 2 tablets (40 mg total) by mouth daily with breakfast for 5 days.    Patient Instructions       If you have lab work done today you will be contacted with your lab results within the next 2 weeks.  If you have not heard from [place] then please contact us. The fastest way to get your results is to register for My Chart.   IF you received an x-ray today, you will receive an invoice from [facility]. Please contact [facility] at [phone] with questions or concerns regarding your invoice.   IF you received labwork today, you will receive an invoice from [facility]. Please contact [facility] at [phone] with questions or concerns regarding your invoice.   Our billing staff will not be able to assist you with questions regarding bills from these companies.  You will be contacted with the lab results as soon as they are available. The fastest way to get your results is to activate your My Chart account. Instructions are located on the last page of this paperwork. If you have not heard from [place] regarding the results in 2 weeks, please contact this office.     Cough, Adult  A cough helps to clear your throat and lungs. A cough may last only 2-3 weeks (acute), or it may last longer than 8 weeks (chronic). Many different things can cause a cough. A cough may be a sign of an illness or another medical condition. Follow these instructions at home:  Pay attention to any changes in your cough.  Take medicines only as told by your doctor. ? If you  were prescribed an antibiotic medicine, take it as told by your doctor. Do not stop taking it even if you start to feel better. ? Talk with your doctor before you try using a cough medicine.  Drink enough fluid to  keep your pee (urine) clear or pale yellow.  If the air is dry, use a cold steam vaporizer or humidifier in your home.  Stay away from things that make you cough at work or at home.  If your cough is worse at night, try using extra pillows to raise your head up higher while you sleep.  Do not smoke, and try not to be around smoke. If you need help quitting, ask your doctor.  Do not have caffeine.  Do not drink alcohol.  Rest as needed. Contact a doctor if:  You have new problems (symptoms).  You cough up yellow fluid (pus).  Your cough does not get better after 2-3 weeks, or your cough gets worse.  Medicine does not help your cough and you are not sleeping well.  You have pain that gets worse or pain that is not helped with medicine.  You have a fever.  You are losing weight and you do not know why.  You have night sweats. Get help right away if:  You cough up blood.  You have trouble breathing.  Your heartbeat is very fast. This information is not intended to replace advice given to you by your health care provider. Make sure you discuss any questions you have with your health care provider. Document Released: 06/17/2011 Document Revised: 03/11/2016 Document Reviewed: 12/11/2014 Elsevier Interactive Patient Education  2019 Elsevier Inc.      Agustina Caroli, MD [facility]

## 2018-12-25 ENCOUNTER — Other Ambulatory Visit: Payer: Self-pay | Admitting: Family Medicine

## 2018-12-25 DIAGNOSIS — M5416 Radiculopathy, lumbar region: Secondary | ICD-10-CM

## 2018-12-31 ENCOUNTER — Other Ambulatory Visit: Payer: Self-pay | Admitting: Family Medicine

## 2018-12-31 DIAGNOSIS — M5416 Radiculopathy, lumbar region: Secondary | ICD-10-CM

## 2019-01-03 ENCOUNTER — Ambulatory Visit: Payer: Self-pay | Admitting: Emergency Medicine

## 2019-01-24 ENCOUNTER — Other Ambulatory Visit: Payer: Self-pay | Admitting: Family Medicine

## 2019-01-24 DIAGNOSIS — M5416 Radiculopathy, lumbar region: Secondary | ICD-10-CM

## 2019-01-24 NOTE — Telephone Encounter (Signed)
Please asked patient to schedule WebEx virtual visit to refill gabapentin.

## 2019-01-24 NOTE — Telephone Encounter (Signed)
Patient scheduled for a Webex appointment for tomorrow. He wanted to know if he could get a few of the gabapentin for tonight. Please advise.

## 2019-01-25 ENCOUNTER — Ambulatory Visit (INDEPENDENT_AMBULATORY_CARE_PROVIDER_SITE_OTHER): Payer: PPO | Admitting: Family Medicine

## 2019-01-25 ENCOUNTER — Other Ambulatory Visit: Payer: Self-pay | Admitting: Family Medicine

## 2019-01-25 DIAGNOSIS — M5416 Radiculopathy, lumbar region: Secondary | ICD-10-CM | POA: Diagnosis not present

## 2019-01-25 DIAGNOSIS — M5137 Other intervertebral disc degeneration, lumbosacral region: Secondary | ICD-10-CM

## 2019-01-25 DIAGNOSIS — J42 Unspecified chronic bronchitis: Secondary | ICD-10-CM

## 2019-01-25 MED ORDER — GABAPENTIN 300 MG PO CAPS: ORAL_CAPSULE | ORAL | 0 refills | Status: DC

## 2019-01-25 MED ORDER — GABAPENTIN 300 MG PO CAPS: 600.0000 mg | ORAL_CAPSULE | Freq: Two times a day (BID) | ORAL | 3 refills | Status: DC

## 2019-01-25 MED ORDER — ALBUTEROL SULFATE HFA 108 (90 BASE) MCG/ACT IN AERS: 1.0000 | INHALATION_SPRAY | Freq: Four times a day (QID) | RESPIRATORY_TRACT | 1 refills | Status: DC | PRN

## 2019-01-25 NOTE — Addendum Note (Signed)
Addended by: Gregor Hams on: 01/25/2019 06:53 AM   Modules accepted: Orders

## 2019-01-25 NOTE — Telephone Encounter (Signed)
Gabapentin sent to pharmacy

## 2019-01-25 NOTE — Progress Notes (Addendum)
Virtual Visit  via Video Note  I connected with      Edward Cuevas  by a video enabled telemedicine application and verified that I am speaking with the correct person using two identifiers.   I discussed the limitations of evaluation and management by telemedicine and the availability of in person appointments. The patient expressed understanding and agreed to proceed.  History of Present Illness: Edward Cuevas is a 63 y.o. male who would like to discuss.  Back pain and lumbar radiculopathy. Edward Cuevas was seen several years ago for back pain and lumbar radiculopathy.  He was prescribed gabapentin and he finds it very helpful.  He is run out and would like a refill.  He currently is taking 600 mg twice daily.  Since he is run out his back pain is escalated and he would very much like a refill if possible he denies any weakness or numbness distally or new bowel or bladder problems.  Additionally he notes he has a diagnosis of COPD and has an old prescription for an albuterol inhaler.  This is expired and he like a refill.  He notes that he has not had any use it currently.  He does not have a lot of wheezing or shortness of breath but would like to have a backup in case he does get sick in the future.  He notes that is been sometime since he has been seen by his primary care provider.  He lists his primary care provider as Iran Planas PA who he was seen here previously.    Observations/Objective: There were no vitals taken for this visit. Wt Readings from Last 5 Encounters:  12/15/18 175 lb 3.2 oz (79.5 kg)  12/09/18 178 lb 3.2 oz (80.8 kg)  06/01/17 153 lb (69.4 kg)  02/21/17 156 lb (70.8 kg)  01/31/17 164 lb (74.4 kg)   Exam: Appearance nontoxic Normal Speech.  No shortness of breath worsen voice quality wheezing or stridor.   Lab and Radiology Results No results found for this or any previous visit (from the past 72 hour(s)). No results found.   Assessment and Plan: 63 y.o.  male with  Lumbago with lumbar radiculopathy.  Doing previously quite well with gabapentin.  Plan to refill gabapentin and recheck in person when able.  Would like to avoid in person visits if possible now due to COVID-19.  COPD: Sounds well controlled.  Plan to refill albuterol with temporary supply.  Again patient should reestablish care back in clinic when able to physically.  Would like to avoid in person visits if possible due to COVID-19.    PDMP not reviewed this encounter. No orders of the defined types were placed in this encounter.  Meds ordered this encounter  Medications  . DISCONTD: gabapentin (NEURONTIN) 300 MG capsule    Sig: SEE NOTES    Dispense:  90 capsule    Refill:  0    TAKE ONE CAPSULE BY MOUTH EVERY NIGHT AT BEDTIME FOR 1 WEEK, TWICE DAILY FOR 1 WEEK, THEN THREE TIMES DAILY. MAY DOUBLE FOR MAX OF 3600MG  PER DAY  . gabapentin (NEURONTIN) 300 MG capsule    Sig: Take 2 capsules (600 mg total) by mouth 2 (two) times daily.    Dispense:  360 capsule    Refill:  3    Replaces prior gabapentin rx  . albuterol (PROAIR HFA) 108 (90 Base) MCG/ACT inhaler    Sig: Inhale 1-2 puffs into the lungs every 6 (six) hours as  needed for wheezing or shortness of breath.    Dispense:  1 Inhaler    Refill:  1    Follow Up Instructions:    I discussed the assessment and treatment plan with the patient. The patient was provided an opportunity to ask questions and all were answered. The patient agreed with the plan and demonstrated an understanding of the instructions.   The patient was advised to call back or seek an in-person evaluation if the symptoms worsen or if the condition fails to improve as anticipated.  I provided 25 minutes of non-face-to-face time during this encounter.    Historical information moved to improve visibility of documentation.  Past Medical History:  Diagnosis Date  . Anxiety   . Arthritis   . Cancer ([place])    skin  . COPD (chronic obstructive  pulmonary disease) ([place])    per pt ?  [name] Depression   . Hepatitis    A,B,C  . Hyperlipidemia   . Hypertension   . Prediabetes   . Vitamin D deficiency    Past Surgical History:  Procedure Laterality Date  . ELBOW SURGERY Right 1986  . [place]  . HERNIA REPAIR Left 1996  . INGUINAL HERNIA REPAIR Right 12/30/2015   Procedure: RIGHT RIGHT INGUINAL HERNIA REPAIR;  Surgeon: Ralene Ok, MD;  Location: [facility];  Service: General;  Laterality: Right;  . INSERTION OF MESH Right 12/30/2015   Procedure: INSERTION OF MESH;  Surgeon: Ralene Ok, MD;  Location: [facility];  Service: General;  Laterality: Right;  . SHOULDER SURGERY Left 2007   Social History   Tobacco Use  . Smoking status: Current Every Day Smoker    Packs/day: 1.50    Years: 3.00    Pack years: 4.50    Types: Cigarettes  . Smokeless tobacco: Never Used  Substance Use Topics  . Alcohol use: Yes    Alcohol/week: 16.0 standard drinks    Types: 4 Cans of beer, 12 Standard drinks or equivalent per week    Comment: Beer on occasion   family history includes Cancer in his father; Diabetes in his mother; Heart disease in his father and maternal grandmother.  Medications: Current Outpatient Medications  Medication Sig Dispense Refill  . acetaminophen (TYLENOL) 650 MG CR tablet Take 1.5 tablets by mouth 2 (two) times daily.    [name] albuterol (PROAIR HFA) 108 (90 Base) MCG/ACT inhaler Inhale 1-2 puffs into the lungs every 6 (six) hours as needed for wheezing or shortness of breath. 1 Inhaler 1  . gabapentin (NEURONTIN) 300 MG capsule Take 2 capsules (600 mg total) by mouth 2 (two) times daily. 360 capsule 3  . MYRBETRIQ 50 MG TB24 tablet TK 1 T PO QD     No current facility-administered medications for this visit.    Allergies  Allergen Reactions  . Penicillins Itching and Rash    Has patient had a PCN reaction causing immediate rash, facial/tongue/throat swelling, SOB or lightheadedness with  hypotension: Yes Has patient had a PCN reaction causing severe rash involving mucus membranes or skin necrosis: No Has patient had a PCN reaction that required hospitalization No Has patient had a PCN reaction occurring within the last 10 years: No If all of the above answers are "NO", then may proceed with Cephalosporin use.

## 2019-03-12 ENCOUNTER — Other Ambulatory Visit: Payer: Self-pay | Admitting: Family Medicine

## 2019-04-09 ENCOUNTER — Encounter: Payer: Self-pay | Admitting: Physician Assistant

## 2019-04-09 ENCOUNTER — Ambulatory Visit (INDEPENDENT_AMBULATORY_CARE_PROVIDER_SITE_OTHER): Payer: PPO | Admitting: Physician Assistant

## 2019-04-09 VITALS — BP 148/77 | HR 78 | Temp 98.6°F | Ht 71.0 in | Wt 176.0 lb

## 2019-04-09 DIAGNOSIS — F17200 Nicotine dependence, unspecified, uncomplicated: Secondary | ICD-10-CM

## 2019-04-09 DIAGNOSIS — N400 Enlarged prostate without lower urinary tract symptoms: Secondary | ICD-10-CM

## 2019-04-09 DIAGNOSIS — E782 Mixed hyperlipidemia: Secondary | ICD-10-CM | POA: Diagnosis not present

## 2019-04-09 DIAGNOSIS — Z131 Encounter for screening for diabetes mellitus: Secondary | ICD-10-CM | POA: Diagnosis not present

## 2019-04-09 DIAGNOSIS — Z1329 Encounter for screening for other suspected endocrine disorder: Secondary | ICD-10-CM

## 2019-04-09 DIAGNOSIS — I6521 Occlusion and stenosis of right carotid artery: Secondary | ICD-10-CM

## 2019-04-09 DIAGNOSIS — J449 Chronic obstructive pulmonary disease, unspecified: Secondary | ICD-10-CM

## 2019-04-09 DIAGNOSIS — Z0000 Encounter for general adult medical examination without abnormal findings: Secondary | ICD-10-CM | POA: Diagnosis not present

## 2019-04-09 DIAGNOSIS — R0989 Other specified symptoms and signs involving the circulatory and respiratory systems: Secondary | ICD-10-CM

## 2019-04-09 DIAGNOSIS — R140 Abdominal distension (gaseous): Secondary | ICD-10-CM | POA: Diagnosis not present

## 2019-04-09 DIAGNOSIS — R1031 Right lower quadrant pain: Secondary | ICD-10-CM

## 2019-04-09 HISTORY — DX: Right lower quadrant pain: R10.31

## 2019-04-09 HISTORY — DX: Nicotine dependence, unspecified, uncomplicated: F17.200

## 2019-04-09 HISTORY — DX: Other specified symptoms and signs involving the circulatory and respiratory systems: R09.89

## 2019-04-09 NOTE — Progress Notes (Signed)
Subjective:    Patient ID: Edward Cuevas, male    DOB: 03-25-1956, 63 y.o.   MRN: [id number]  HPI Pt is a 63 yo male with HtN, HLD, GAD, COPD, BPH, pre-diabetes who presents to the clinic for annual visit.   He is concerned about a few things. His biggest concern is his abdominal pain and distention. This is ongoing for a few months. Seem to be getting worse. He is having daily BM's. Admits that some are hard. Hx of hernia repair. He just feels full in the right lower quadrant and at times feels pain. It is also tender to touch.   He continues to have BPH symptoms. He urinates very frequently every 20-30 minutes. He is on myrbetriq and see urology.   He continues to smoke at least a pack a day. He denies any worrisome SOB or cough.   He continues to have back pain. He uses gabapentin. He sees Dr. Georgina Cuevas.   .. Active Ambulatory Problems    Diagnosis Date Noted  . Mixed hyperlipidemia   . Hypertension   . Prediabetes   . Vitamin D deficiency   . DJD 10/23/2013  . DDD (degenerative disc disease), lumbosacral 10/23/2013  . Panic attacks 10/04/2014  . History of hepatitis C 10/08/2014  . Right inguinal hernia 10/08/2014  . GAD (generalized anxiety disorder) 11/01/2014  . Actinic keratosis 06/27/2015  . BPH without obstruction/lower urinary tract symptoms 11/25/2015  . Chronic pain of multiple joints 11/25/2015  . Fatty liver 10/01/2016  . Chronic obstructive pulmonary disease ([place]) 12/29/2016  . Lumbar radiculopathy 01/18/2017  . Right carotid bruit 04/09/2019  . Right lower quadrant abdominal pain 04/09/2019  . Current smoker 04/09/2019  . Abdominal distension (gaseous) 04/11/2019   Resolved Ambulatory Problems    Diagnosis Date Noted  . Encounter for long-term (current) use of other medications 04/08/2014  . Olecranon bursitis 01/31/2015  . Urinary frequency 12/29/2016  . Routine physical examination 12/29/2016  . Right ankle pain 02/21/2017   Past Medical History:   Diagnosis Date  . Anxiety   . Arthritis   . Cancer ([place])   . COPD (chronic obstructive pulmonary disease) ([place])   . Depression   . Hepatitis   . Hyperlipidemia    .[name] Family History  Problem Relation Age of Onset  . Diabetes Mother   . Heart disease Father   . Cancer Father   . Heart disease Maternal Grandmother       Review of Systems See HPI.     Objective:   Physical Exam Vitals signs reviewed.  Constitutional:      Appearance: Normal appearance.  HENT:     Head: Normocephalic.     Right Ear: Tympanic membrane and ear canal normal.     Left Ear: Tympanic membrane and ear canal normal.     Nose: Nose normal.     Mouth/Throat:     Mouth: Mucous membranes are moist.     Pharynx: Oropharynx is clear.  Eyes:     Extraocular Movements: Extraocular movements intact.     Conjunctiva/sclera: Conjunctivae normal.     Pupils: Pupils are equal, round, and reactive to light.  Neck:     Vascular: Carotid bruit present.     Comments: Bruit on right.  Cardiovascular:     Rate and Rhythm: Normal rate and regular rhythm.     Pulses: Normal pulses.  Pulmonary:     Effort: Pulmonary effort is normal.     Breath sounds: Normal breath  sounds.  Abdominal:     General: Bowel sounds are normal. There is distension.     Palpations: There is no mass.     Tenderness: There is no guarding.     Hernia: No hernia is present.     Comments: RLQ tenderness to palpation. No guarding or rebound.  Abdomen feels slightly distended and firm to palpation.   Musculoskeletal: Normal range of motion.  Neurological:     General: No focal deficit present.     Mental Status: He is alert and oriented to person, place, and time.  Psychiatric:        Mood and Affect: Mood normal.        Behavior: Behavior normal.           Assessment & Plan:  .Marland KitchenJohn was seen today for annual exam.  Diagnoses and all orders for this visit:  Routine physical examination  Right carotid bruit  Right  lower quadrant abdominal pain  Mixed hyperlipidemia -     Lipid Panel w/reflex Direct LDL  BPH without obstruction/lower urinary tract symptoms -     PSA -     CBC with Differential/Platelet  Current smoker  Chronic obstructive pulmonary disease, unspecified COPD type ([place])  Screening for diabetes mellitus -     COMPLETE METABOLIC PANEL WITH GFR  Screening for thyroid disorder -     TSH  .[name] Depression screen [facility] 2/9 04/09/2019 12/15/2018 12/29/2016  Decreased Interest 3 0 1  Down, Depressed, Hopeless 0 0 -  PHQ - 2 Score 3 0 1  Altered sleeping 3 - -  Tired, decreased energy 2 - -  Change in appetite 0 - -  Feeling bad or failure about yourself  0 - -  Trouble concentrating 0 - -  Moving slowly or fidgety/restless 0 - -  Suicidal thoughts 0 - -  PHQ-9 Score 8 - -  Difficult doing work/chores Somewhat difficult - -    .Marland KitchenStart a regular exercise program and make sure you are eating a healthy diet Try to eat 4 servings of dairy a day or take a calcium supplement (500mg  twice a day).  Colonoscopy up to date.  Needs shingrix. Declined today. Fasting labs ordered.   Pt declined smoking cessation. He should qualify for lung cancer CT screening. Order placed today.   CT of abdomen and pelvis ordered to evaluate abdominal pain and distention.   Carotid u/s order due to bruit heard on exam today.   [name].IPSS Questionnaire (AUA-7): Over the past month.   1)  How often have you had a sensation of not emptying your bladder completely after you finish urinating?  5 - Almost always  2)  How often have you had to urinate again less than two hours after you finished urinating? 5 - Almost always  3)  How often have you found you stopped and started again several times when you urinated?  5 - Almost always  4) How difficult have you found it to postpone urination?  5 - Almost always  5) How often have you had a weak urinary stream?  5 - Almost always  6) How often have you had to push or  strain to begin urination?  5 - Almost always  7) How many times did you most typically get up to urinate from the time you went to bed until the time you got up in the morning?  4 - 4 times  Total score:  0-7 mildly symptomatic  8-19 moderately symptomatic   20-35 severely symptomatic   Severe BPH symptoms. Will order PSA. Encouraged to follow up with urology. Will add terasozin.

## 2019-04-09 NOTE — Patient Instructions (Signed)
Will get CT of abdomen, carotid ultrasound, labs. Will call with results.

## 2019-04-10 DIAGNOSIS — N400 Enlarged prostate without lower urinary tract symptoms: Secondary | ICD-10-CM | POA: Diagnosis not present

## 2019-04-10 DIAGNOSIS — E782 Mixed hyperlipidemia: Secondary | ICD-10-CM | POA: Diagnosis not present

## 2019-04-10 DIAGNOSIS — Z1329 Encounter for screening for other suspected endocrine disorder: Secondary | ICD-10-CM | POA: Diagnosis not present

## 2019-04-10 DIAGNOSIS — R739 Hyperglycemia, unspecified: Secondary | ICD-10-CM | POA: Diagnosis not present

## 2019-04-10 DIAGNOSIS — Z131 Encounter for screening for diabetes mellitus: Secondary | ICD-10-CM | POA: Diagnosis not present

## 2019-04-11 ENCOUNTER — Telehealth: Payer: Self-pay | Admitting: Physician Assistant

## 2019-04-11 ENCOUNTER — Encounter: Payer: Self-pay | Admitting: Physician Assistant

## 2019-04-11 ENCOUNTER — Telehealth: Payer: Self-pay

## 2019-04-11 DIAGNOSIS — R140 Abdominal distension (gaseous): Secondary | ICD-10-CM | POA: Insufficient documentation

## 2019-04-11 HISTORY — DX: Abdominal distension (gaseous): R14.0

## 2019-04-11 MED ORDER — ATORVASTATIN CALCIUM 10 MG PO TABS: 10.0000 mg | ORAL_TABLET | Freq: Every day | ORAL | 3 refills | Status: DC

## 2019-04-11 MED ORDER — TERAZOSIN HCL 1 MG PO CAPS: 1.0000 mg | ORAL_CAPSULE | Freq: Every day | ORAL | 5 refills | Status: DC

## 2019-04-11 NOTE — Progress Notes (Signed)
Call pt: cholesterol looks pretty good. Your sugar is a little elevated (please add a1c to evaluate).  Kidney, liver look good.  PSA is stable from 3 years ago.  Thyroid is perfect.

## 2019-04-11 NOTE — Telephone Encounter (Signed)
Patient made aware and is agreeable to medication and testing.

## 2019-04-11 NOTE — Telephone Encounter (Signed)
Please call pt>   Should be scheduled for CT of abd. Carotid dopplers and lung cancer screening.   I sent over lipitor 10mg  for carotid bruit.   I sent of terazosin to add to myrbetriq to  See if helps prostate symptoms.

## 2019-04-12 LAB — LIPID PANEL W/REFLEX DIRECT LDL
Cholesterol: 165 mg/dL (ref <200)
HDL: 48 mg/dL (ref >=40)
LDL Cholesterol (Calc): 103 mg/dL (calc) — ABNORMAL HIGH
Non-HDL Cholesterol (Calc): 117 mg/dL (calc) (ref <130)
Total CHOL/HDL Ratio: 3.4 (calc) (ref <5.0)
Triglycerides: 56 mg/dL (ref <150)

## 2019-04-12 LAB — CBC WITH DIFFERENTIAL/PLATELET
Absolute Monocytes: 551 cells/uL (ref 200–950)
Basophils Absolute: 58 cells/uL (ref 0–200)
Basophils Relative: 1 %
Eosinophils Absolute: 81 cells/uL (ref 15–500)
Eosinophils Relative: 1.4 %
HCT: 48.7 % (ref 38.5–50.0)
Hemoglobin: 17.1 g/dL (ref 13.2–17.1)
Lymphs Abs: 2627 cells/uL (ref 850–3900)
MCH: 32.1 pg (ref 27.0–33.0)
MCHC: 35.1 g/dL (ref 32.0–36.0)
MCV: 91.4 fL (ref 80.0–100.0)
MPV: 10 fL (ref 7.5–12.5)
Monocytes Relative: 9.5 %
Neutro Abs: 2482 cells/uL (ref 1500–7800)
Neutrophils Relative %: 42.8 %
Platelets: 198 10*3/uL (ref 140–400)
RBC: 5.33 10*6/uL (ref 4.20–5.80)
RDW: 12.1 % (ref 11.0–15.0)
Total Lymphocyte: 45.3 %
WBC: 5.8 10*3/uL (ref 3.8–10.8)

## 2019-04-12 LAB — COMPLETE METABOLIC PANEL WITH GFR
AG Ratio: 1.7 (calc) (ref 1.0–2.5)
ALT: 30 U/L (ref 9–46)
AST: 32 U/L (ref 10–35)
Albumin: 4.5 g/dL (ref 3.6–5.1)
Alkaline phosphatase (APISO): 46 U/L (ref 35–144)
BUN: 14 mg/dL (ref 7–25)
CO2: 29 mmol/L (ref 20–32)
Calcium: 9.6 mg/dL (ref 8.6–10.3)
Chloride: 102 mmol/L (ref 98–110)
Creat: 1.03 mg/dL (ref 0.70–1.25)
GFR, Est African American: 89 mL/min/{1.73_m2} (ref >=60)
GFR, Est Non African American: 76 mL/min/{1.73_m2} (ref >=60)
Globulin: 2.6 g/dL (calc) (ref 1.9–3.7)
Glucose, Bld: 111 mg/dL — ABNORMAL HIGH (ref 65–99)
Potassium: 4.4 mmol/L (ref 3.5–5.3)
Sodium: 139 mmol/L (ref 135–146)
Total Bilirubin: 0.4 mg/dL (ref 0.2–1.2)
Total Protein: 7.1 g/dL (ref 6.1–8.1)

## 2019-04-12 LAB — HEMOGLOBIN A1C W/OUT EAG: Hgb A1c MFr Bld: 5.5 % of total Hgb (ref <5.7)

## 2019-04-12 LAB — PSA: PSA: 2 ng/mL (ref <=4.0)

## 2019-04-12 LAB — TSH: TSH: 2.88 mIU/L (ref 0.40–4.50)

## 2019-04-16 ENCOUNTER — Ambulatory Visit (HOSPITAL_BASED_OUTPATIENT_CLINIC_OR_DEPARTMENT_OTHER)
Admission: RE | Admit: 2019-04-16 | Discharge: 2019-04-16 | Disposition: A | Discharge status: Home / Self Care | Payer: PPO | Source: Ambulatory Visit | Attending: Physician Assistant | Admitting: Physician Assistant

## 2019-04-16 ENCOUNTER — Other Ambulatory Visit: Payer: Self-pay

## 2019-04-16 DIAGNOSIS — Z8619 Personal history of other infectious and parasitic diseases: Secondary | ICD-10-CM | POA: Diagnosis not present

## 2019-04-16 DIAGNOSIS — Z79899 Other long term (current) drug therapy: Secondary | ICD-10-CM | POA: Insufficient documentation

## 2019-04-16 DIAGNOSIS — E278 Other specified disorders of adrenal gland: Secondary | ICD-10-CM | POA: Diagnosis not present

## 2019-04-16 DIAGNOSIS — I10 Essential (primary) hypertension: Secondary | ICD-10-CM | POA: Insufficient documentation

## 2019-04-16 DIAGNOSIS — E782 Mixed hyperlipidemia: Secondary | ICD-10-CM | POA: Insufficient documentation

## 2019-04-16 DIAGNOSIS — R140 Abdominal distension (gaseous): Secondary | ICD-10-CM | POA: Diagnosis not present

## 2019-04-16 DIAGNOSIS — N400 Enlarged prostate without lower urinary tract symptoms: Secondary | ICD-10-CM | POA: Insufficient documentation

## 2019-04-16 DIAGNOSIS — R7303 Prediabetes: Secondary | ICD-10-CM | POA: Insufficient documentation

## 2019-04-16 DIAGNOSIS — F17210 Nicotine dependence, cigarettes, uncomplicated: Secondary | ICD-10-CM | POA: Insufficient documentation

## 2019-04-16 DIAGNOSIS — R0989 Other specified symptoms and signs involving the circulatory and respiratory systems: Secondary | ICD-10-CM | POA: Insufficient documentation

## 2019-04-16 DIAGNOSIS — I700 Atherosclerosis of aorta: Secondary | ICD-10-CM | POA: Diagnosis not present

## 2019-04-16 DIAGNOSIS — I6523 Occlusion and stenosis of bilateral carotid arteries: Secondary | ICD-10-CM | POA: Insufficient documentation

## 2019-04-16 DIAGNOSIS — J449 Chronic obstructive pulmonary disease, unspecified: Secondary | ICD-10-CM | POA: Insufficient documentation

## 2019-04-16 MED ORDER — IOHEXOL 300 MG/ML  SOLN
100.0000 mL | Freq: Once | INTRAMUSCULAR | Status: AC | PRN
  Administered 2019-04-16: 100 mL via INTRAVENOUS

## 2019-04-17 ENCOUNTER — Other Ambulatory Visit: Payer: Self-pay | Admitting: Neurology

## 2019-04-17 ENCOUNTER — Encounter: Payer: Self-pay | Admitting: Physician Assistant

## 2019-04-17 DIAGNOSIS — I6521 Occlusion and stenosis of right carotid artery: Secondary | ICD-10-CM

## 2019-04-17 DIAGNOSIS — R0989 Other specified symptoms and signs involving the circulatory and respiratory systems: Secondary | ICD-10-CM

## 2019-04-17 DIAGNOSIS — D3500 Benign neoplasm of unspecified adrenal gland: Secondary | ICD-10-CM | POA: Insufficient documentation

## 2019-04-17 HISTORY — DX: Occlusion and stenosis of right carotid artery: I65.21

## 2019-04-17 HISTORY — DX: Benign neoplasm of unspecified adrenal gland: D35.00

## 2019-04-17 MED ORDER — LINACLOTIDE 290 MCG PO CAPS: 290.0000 ug | ORAL_CAPSULE | Freq: Every day | ORAL | 3 refills | Status: DC

## 2019-04-17 NOTE — Progress Notes (Signed)
You do have moderate amt of build up in your right carotid artery. This is a risk for stroke. Make sure you are taking lipitor daily. At this point you surgery is not indicated until great than 70 percent narrowed. You are in the window of 50-69 percent. I would like to go ahead and get you in with vascular for follow ups and regular checks so that if needed be available for intervention when needed. Are you ok with this?  Confirm he is taking his lipitor.

## 2019-04-17 NOTE — Progress Notes (Signed)
CT of abdomen shows no acute reason for abdominal pain. 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 How often are you bowel movements? Are they soft and full?

## 2019-04-17 NOTE — Progress Notes (Signed)
I sent linzess once daily before breakfast to try. Biggest side effect is diarrhea but give it a few days settle down before saying you cannot tolerate it. It is a medication for chronic constipation.

## 2019-04-17 NOTE — Addendum Note (Signed)
Addended by: Donella Stade on: 04/17/2019 10:39 AM   Modules accepted: Orders

## 2019-04-23 ENCOUNTER — Other Ambulatory Visit: Payer: Self-pay | Admitting: *Deleted

## 2019-04-23 DIAGNOSIS — F17210 Nicotine dependence, cigarettes, uncomplicated: Secondary | ICD-10-CM

## 2019-04-23 DIAGNOSIS — Z122 Encounter for screening for malignant neoplasm of respiratory organs: Secondary | ICD-10-CM

## 2019-04-23 DIAGNOSIS — Z87891 Personal history of nicotine dependence: Secondary | ICD-10-CM

## 2019-05-09 NOTE — Progress Notes (Addendum)
Subjective:   Edward Cuevas is a 63 y.o. male who presents for an Initial Medicare Annual Wellness Visit.  Review of Systems  No ROS.  Medicare Wellness Virtual Visit.  Visual/audio telehealth visit, UTA vital signs.   See social history for additional risk factors.    Cardiac Risk Factors include: advanced age (>57men, >85 women);smoking/ tobacco exposure;sedentary lifestyle;male gender  Sleep patterns: Getting 3 hours of sleep a night. Wakes up 3-4 times a night to go to void. Wakes up and feels sluggish due to no sleep.  Home Safety/Smoke Alarms: Feels safe in home. Smoke alarms in place.  Living environment; Lives with ex-wife in a 2 story town home. Steps have hand rails on them. Shower is a step over tub and no grab bars in place. Seat Belt Safety/Bike Helmet: Wears seat belt.    Male:   CCS-  UTD   PSA-  UTD Lab Results  Component Value Date   PSA 2.0 04/10/2019   PSA 1.67 11/24/2015   PSA 1.85 09/19/2014       Objective:    Today's Vitals   05/15/19 1459  Weight: 176 lb (79.8 kg)  Height: 5\' 11"  (1.803 m)  PainSc: 4    Body mass index is 24.55 kg/m.  Advanced Directives 05/15/2019 12/26/2015 10/04/2014  Does Patient Have a Medical Advance Directive? No No No  Would patient like information on creating a medical advance directive? No - Patient declined - Yes - Educational materials given    Current Medications (verified) Outpatient Encounter Medications as of 05/15/2019  Medication Sig  . albuterol (PROAIR HFA) 108 (90 Base) MCG/ACT inhaler Inhale 1-2 puffs into the lungs every 6 (six) hours as needed for wheezing or shortness of breath.  [name] atorvastatin (LIPITOR) 10 MG tablet Take 1 tablet (10 mg total) by mouth daily.  [name] gabapentin (NEURONTIN) 300 MG capsule Take 2 capsules (600 mg total) by mouth 2 (two) times daily.  [name] linaclotide (LINZESS) 290 MCG CAPS capsule Take 1 capsule (290 mcg total) by mouth daily.  [name] terazosin (HYTRIN) 1 MG capsule Take 1 capsule  (1 mg total) by mouth at bedtime.  [name] MYRBETRIQ 50 MG TB24 tablet TK 1 T PO QD   No facility-administered encounter medications on file as of 05/15/2019.     Allergies (verified) Penicillins   History: Past Medical History:  Diagnosis Date  . Anxiety   . Arthritis   . Cancer ([place])    skin  . COPD (chronic obstructive pulmonary disease) ([place])    per pt ?  [name] Depression   . Hepatitis    A,B,C  . Hyperlipidemia   . Hypertension   . Prediabetes   . Vitamin D deficiency    Past Surgical History:  Procedure Laterality Date  . ELBOW SURGERY Right 1986  . [place]  . HERNIA REPAIR Left 1996  . INGUINAL HERNIA REPAIR Right 12/30/2015   Procedure: RIGHT RIGHT INGUINAL HERNIA REPAIR;  Surgeon: Ralene Ok, MD;  Location: [facility];  Service: General;  Laterality: Right;  . INSERTION OF MESH Right 12/30/2015   Procedure: INSERTION OF MESH;  Surgeon: Ralene Ok, MD;  Location: [facility];  Service: General;  Laterality: Right;  . SHOULDER SURGERY Left 2007   Family History  Problem Relation Age of Onset  . Diabetes Mother   . Heart disease Father   . Cancer Father   . Heart disease Maternal Grandmother    Social History   Socioeconomic History  .  Marital status: Divorced    Spouse name: Not on file  . Number of children: 2  . Years of education: 3  . Highest education level: 11th grade  Occupational History  . 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 Financial resource strain: Not hard at all  . Food insecurity    Worry: Never true    Inability: Never true  . Transportation needs    Medical: No    Non-medical: No  Tobacco Use  . Smoking status: Current Every Day Smoker    Packs/day: 1.00    Years: 30.00    Pack years: 30.00    Types: Cigarettes  . Smokeless tobacco: Never Used  Substance and Sexual Activity  . Alcohol use: Yes    Alcohol/week: 16.0 standard drinks    Types: 4 Cans of beer, 12 Standard drinks or  equivalent per week    Comment: Beer on occasion  . Drug use: No  . Sexual activity: Not Currently  Lifestyle  . Physical activity    Days per week: 0 days    Minutes per session: 0 min  . Stress: Not at all  Relationships  . Social Herbalist on phone: Once a week    Gets together: Never    Attends religious service: Never    Active member of club or organization: No    Attends meetings of clubs or organizations: Never    Relationship status: Divorced  Other Topics Concern  . Not on file  Social History Narrative  . Not on file   Tobacco Counseling Ready to quit: No Counseling given: Not Answered   Clinical Intake:  Pre-visit preparation completed: Yes  Pain : 0-10 Pain Score: 4  Pain Type: Chronic pain Pain Location: Back Pain Orientation: Right, Left, Lower Pain Descriptors / Indicators: Aching, Constant, Throbbing Pain Onset: More than a month ago Pain Frequency: Intermittent Pain Relieving Factors: meds help  Pain Relieving Factors: meds help  Nutritional Risks: None Diabetes: No  How often do you need to have someone help you when you read instructions, pamphlets, or other written materials from your doctor or pharmacy?: 1 - Never What is the last grade level you completed in school?: 11  Interpreter Needed?: No  Information entered by :: Orlie Dakin, LPN  Activities of Daily Living In your present state of health, do you have any difficulty performing the following activities: 05/15/2019  Hearing? Y  Vision? N  Difficulty concentrating or making decisions? N  Walking or climbing stairs? Y  Dressing or bathing? N  Doing errands, shopping? N  Preparing Food and eating ? N  Using the Toilet? N  In the past six months, have you accidently leaked urine? N  Do you have problems with loss of bowel control? N  Managing your Medications? N  Managing your Finances? N  Housekeeping or managing your Housekeeping? N  Some recent data might be  hidden     Immunizations and Health Maintenance Immunization History  Administered Date(s) Administered  . Pneumococcal Polysaccharide-23 04/07/2009  . Tdap 04/12/2011   There are no preventive care reminders to display for this patient.  Patient Care Team: Lavada Mesi as PCP - General (Family Medicine)  Indicate any recent Medical Services you may have received from other than [facility] providers in the past year (date may be approximate).    Assessment:   This is a routine wellness examination for Trevonte.Physical assessment deferred to PCP.  Hearing/Vision screen No exam data present  Dietary issues and exercise activities discussed: Current Exercise Habits: The patient does not participate in regular exercise at present, Exercise limited by: orthopedic condition(s);psychological condition(s) Diet Eats a fairly healthy meal. Eats out sometimes Breakfast: cereal and coffee Lunch: skips Dinner: Meat and vegetables for supper      Goals    . Patient Stated     Patient stated would like to get his bladder under control where he is not going to the bathroom so much.      Depression Screen PHQ 2/9 Scores 05/15/2019 04/09/2019 12/15/2018 12/29/2016  PHQ - 2 Score 3 3 0 1  PHQ- 9 Score 7 8 - -    Fall Risk Fall Risk  05/15/2019 12/15/2018 12/09/2018  Falls in the past year? 0 0 0  Number falls in past yr: - - 0  Injury with Fall? 0 - 0  Risk for fall due to : Impaired mobility - -  Follow up Falls prevention discussed Falls evaluation completed Falls evaluation completed    Is the patient's home free of loose throw rugs in walkways, pet beds, electrical cords, etc?   yes      Grab bars in the bathroom? no      Handrails on the stairs?   yes      Adequate lighting?   yes   Cognitive Function:     6CIT Screen 05/15/2019  What Year? 0 points  What month? 0 points  What time? 0 points  Count back from 20 2 points  Months in reverse 4 points  Repeat phrase 4 points   Total Score 10    Screening Tests Health Maintenance  Topic Date Due  . INFLUENZA VACCINE  05/19/2019  . COLONOSCOPY  06/03/2020  . TETANUS/TDAP  04/11/2021  . Hepatitis C Screening  Completed  . HIV Screening  Completed        Plan:      Mr. Popoff , Thank you for taking time to come for your Medicare Wellness Visit. I appreciate your ongoing commitment to your health goals. Please review the following plan we discussed and let me know if I can assist you in the future.  Please schedule your next medicare wellness visit with me in 1 yr.   These are the goals we discussed: Goals    . Patient Stated     Patient stated would like to get his bladder under control where he is not going to the bathroom so much.       This is a list of the screening recommended for you and due dates:  Health Maintenance  Topic Date Due  . Flu Shot  05/19/2019  . Colon Cancer Screening  06/03/2020  . Tetanus Vaccine  04/11/2021  .  Hepatitis C: One time screening is recommended by Center for Disease Control  (CDC) for  adults born from [birth year] through 1966.   Completed  . HIV Screening  Completed     I have personally reviewed and noted the following in the patient's chart:   . Medical and social history . Use of alcohol, tobacco or illicit drugs  . Current medications and supplements . Functional ability and status . Nutritional status . Physical activity . Advanced directives . List of other physicians . Hospitalizations, surgeries, and ER visits in previous 12 months . Vitals . Screenings to include cognitive, depression, and falls . Referrals and appointments  In addition, I have reviewed and discussed with patient  certain preventive protocols, quality metrics, and best practice recommendations. A written personalized care plan for preventive services as well as general preventive health recommendations were provided to patient.     Joanne Chars, LPN   [id number]     .Marland KitchenMedical screening examination/treatment was performed by qualified clinical staff member and as supervising physician I was immediately available for consultation/collaboration. I have reviewed documentation and agree with assessment and plan.  Will call patient to schedule virtual to take about depression/mood.   Iran Planas, PA-C

## 2019-05-14 ENCOUNTER — Ambulatory Visit
Admission: RE | Admit: 2019-05-14 | Discharge: 2019-05-14 | Disposition: A | Discharge status: Home / Self Care | Payer: PPO | Source: Ambulatory Visit | Attending: Acute Care | Admitting: Acute Care

## 2019-05-14 ENCOUNTER — Other Ambulatory Visit: Payer: Self-pay

## 2019-05-14 ENCOUNTER — Encounter: Payer: Self-pay | Admitting: Acute Care

## 2019-05-14 ENCOUNTER — Ambulatory Visit (INDEPENDENT_AMBULATORY_CARE_PROVIDER_SITE_OTHER): Payer: PPO | Admitting: Acute Care

## 2019-05-14 VITALS — BP 138/70 | HR 76 | Temp 98.2°F | Ht 72.0 in | Wt 174.6 lb

## 2019-05-14 DIAGNOSIS — Z87891 Personal history of nicotine dependence: Secondary | ICD-10-CM

## 2019-05-14 DIAGNOSIS — Z122 Encounter for screening for malignant neoplasm of respiratory organs: Secondary | ICD-10-CM

## 2019-05-14 DIAGNOSIS — F17210 Nicotine dependence, cigarettes, uncomplicated: Secondary | ICD-10-CM | POA: Diagnosis not present

## 2019-05-14 NOTE — Progress Notes (Signed)
Shared Decision Making Visit Lung Cancer Screening Program [phone])   Eligibility:  Age 63 y.o.  Pack Years Smoking History Calculation 49 pack year smoking history (# packs/per year x # years smoked)  Recent History of coughing up blood  no  Unexplained weight loss? no ( >Than 15 pounds within the last 6 months )  Prior History Lung / other cancer no (Diagnosis within the last 5 years already requiring surveillance chest CT Scans).  Smoking Status Current Smoker  Former Smokers: Years since quit: NA  Quit Date: NA  Visit Components:  Discussion included one or more decision making aids. yes  Discussion included risk/benefits of screening. yes  Discussion included potential follow up diagnostic testing for abnormal scans. yes  Discussion included meaning and risk of over diagnosis. yes  Discussion included meaning and risk of False Positives. yes  Discussion included meaning of total radiation exposure. yes  Counseling Included:  Importance of adherence to annual lung cancer LDCT screening. yes  Impact of comorbidities on ability to participate in the program. yes  Ability and willingness to under diagnostic treatment. yes  Smoking Cessation Counseling:  Current Smokers:   Discussed importance of smoking cessation. yes  Information about tobacco cessation classes and interventions provided to patient. yes  Patient provided with "ticket" for LDCT Scan. yes  Symptomatic Patient. no  Counseling  Diagnosis Code: Tobacco Use Z72.0  Asymptomatic Patient yes  Counseling (Intermediate counseling: > three minutes counseling) U2025  Former Smokers:   Discussed the importance of maintaining cigarette abstinence. yes  Diagnosis Code: Personal History of Nicotine Dependence. K27.062  Information about tobacco cessation classes and interventions provided to patient. Yes  Patient provided with "ticket" for LDCT Scan. yes  Written Order for Lung Cancer  Screening with LDCT placed in Epic. Yes (CT Chest Lung Cancer Screening Low Dose W/O CM) [id number] Z12.2-Screening of respiratory organs Z87.891-Personal history of nicotine dependence  I have spent 25 minutes of face to face time with Mr. Tomko discussing the risks and benefits of lung cancer screening. We viewed a power point together that explained in detail the above noted topics. We paused at intervals to allow for questions to be asked and answered to ensure understanding.We discussed that the single most powerful action that he can take to decrease his risk of developing lung cancer is to quit smoking. We discussed whether or not he is ready to commit to setting a quit date. We discussed options for tools to aid in quitting smoking including nicotine replacement therapy, non-nicotine medications, support groups, Quit Smart classes, and behavior modification. We discussed that often times setting smaller, more achievable goals, such as eliminating 1 cigarette a day for a week and then 2 cigarettes a day for a week can be helpful in slowly decreasing the number of cigarettes smoked. This allows for a sense of accomplishment as well as providing a clinical benefit. I gave him the " Be Stronger Than Your Excuses" card with contact information for community resources, classes, free nicotine replacement therapy, and access to mobile apps, text messaging, and on-line smoking cessation help. I have also given him my card and contact information in the event he needs to contact me. We discussed the time and location of the scan, and that either Doroteo Glassman RN or I will call with the results within 24-48 hours of receiving them. I have offered him  a copy of the power point we viewed  as a resource in the event they need reinforcement of  the concepts we discussed today in the office. The patient verbalized understanding of all of  the above and had no further questions upon leaving the office. They have my  contact information in the event they have any further questions.  I spent 5 minutes counseling on smoking cessation and the health risks of continued tobacco abuse.  I explained to the patient that there has been a high incidence of coronary artery disease noted on these exams. I explained that this is a non-gated exam therefore degree or severity cannot be determined. This patient is on statin therapy. I have asked the patient to follow-up with their PCP regarding any incidental finding of coronary artery disease and management with diet or medication as their PCP  feels is clinically indicated. The patient verbalized understanding of the above and had no further questions upon completion of the visit.   Magdalen Spatz, NP 05/14/2019 3:33 PM

## 2019-05-15 ENCOUNTER — Ambulatory Visit (INDEPENDENT_AMBULATORY_CARE_PROVIDER_SITE_OTHER): Payer: PPO | Admitting: *Deleted

## 2019-05-15 ENCOUNTER — Ambulatory Visit: Payer: PPO

## 2019-05-15 ENCOUNTER — Telehealth: Payer: Self-pay | Admitting: Physician Assistant

## 2019-05-15 VITALS — Ht 71.0 in | Wt 176.0 lb

## 2019-05-15 DIAGNOSIS — Z0000 Encounter for general adult medical examination without abnormal findings: Secondary | ICD-10-CM | POA: Diagnosis not present

## 2019-05-15 NOTE — Patient Instructions (Addendum)
Mr. Edward Cuevas , Thank you for taking time to come for your Medicare Wellness Visit. I appreciate your ongoing commitment to your health goals. Please review the following plan we discussed and let me know if I can assist you in the future.  Please schedule your next medicare wellness visit with me in 1 yr.  These are the goals we discussed: Goals    . Patient Stated     Patient stated would like to get his bladder under control where he is not going to the bathroom so much.

## 2019-05-15 NOTE — Telephone Encounter (Signed)
On medicare wellness PHQ-9 and GAD-7 were elevated I would like to schedule virtual or telephone call to talk about this.

## 2019-05-16 ENCOUNTER — Ambulatory Visit (INDEPENDENT_AMBULATORY_CARE_PROVIDER_SITE_OTHER): Payer: PPO | Admitting: Physician Assistant

## 2019-05-16 ENCOUNTER — Encounter: Payer: Self-pay | Admitting: Physician Assistant

## 2019-05-16 VITALS — Temp 98.2°F | Ht 71.0 in | Wt 176.0 lb

## 2019-05-16 DIAGNOSIS — I2584 Coronary atherosclerosis due to calcified coronary lesion: Secondary | ICD-10-CM | POA: Diagnosis not present

## 2019-05-16 DIAGNOSIS — J432 Centrilobular emphysema: Secondary | ICD-10-CM

## 2019-05-16 DIAGNOSIS — F331 Major depressive disorder, recurrent, moderate: Secondary | ICD-10-CM

## 2019-05-16 DIAGNOSIS — F411 Generalized anxiety disorder: Secondary | ICD-10-CM

## 2019-05-16 DIAGNOSIS — I2510 Atherosclerotic heart disease of native coronary artery without angina pectoris: Secondary | ICD-10-CM

## 2019-05-16 DIAGNOSIS — I6521 Occlusion and stenosis of right carotid artery: Secondary | ICD-10-CM | POA: Diagnosis not present

## 2019-05-16 DIAGNOSIS — R918 Other nonspecific abnormal finding of lung field: Secondary | ICD-10-CM

## 2019-05-16 DIAGNOSIS — I700 Atherosclerosis of aorta: Secondary | ICD-10-CM

## 2019-05-16 HISTORY — DX: Atherosclerosis of aorta: I70.0

## 2019-05-16 HISTORY — DX: Major depressive disorder, recurrent, moderate: F33.1

## 2019-05-16 HISTORY — DX: Other nonspecific abnormal finding of lung field: R91.8

## 2019-05-16 HISTORY — DX: Atherosclerotic heart disease of native coronary artery without angina pectoris: I25.10

## 2019-05-16 HISTORY — DX: Centrilobular emphysema: J43.2

## 2019-05-16 MED ORDER — UMECLIDINIUM-VILANTEROL 62.5-25 MCG/INH IN AEPB: 1.0000 | INHALATION_SPRAY | Freq: Every day | RESPIRATORY_TRACT | 1 refills | Status: DC

## 2019-05-16 MED ORDER — DULOXETINE HCL 30 MG PO CPEP: 30.0000 mg | ORAL_CAPSULE | Freq: Every day | ORAL | 1 refills | Status: DC

## 2019-05-16 MED ORDER — ALBUTEROL SULFATE HFA 108 (90 BASE) MCG/ACT IN AERS: 1.0000 | INHALATION_SPRAY | Freq: Four times a day (QID) | RESPIRATORY_TRACT | 1 refills | Status: DC | PRN

## 2019-05-16 NOTE — Telephone Encounter (Signed)
Please call and scheduled for appt with PCP

## 2019-05-16 NOTE — Progress Notes (Signed)
Patient ID: Edward Cuevas, male   DOB: 1956/02/11, 63 y.o.   MRN: [id number] .Marland KitchenVirtual Visit via Telephone Note  I connected with Lynford Citizen on 05/16/19 at 11:10 AM EDT by telephone and verified that I am speaking with the correct person using two identifiers.  Location: Patient: home Provider: clinic   I discussed the limitations, risks, security and privacy concerns of performing an evaluation and management service by telephone and the availability of in person appointments. I also discussed with the patient that there may be a patient responsible charge related to this service. The patient expressed understanding and agreed to proceed.   History of Present Illness: Pt is a 63 yo male pmhx of COPD, chronic cough, hepatitis, current smoker, anxiety, and depression who calls in today to discuss worsening mood.   He was seen for medicare wellness exam yesterday and PHQ-9 and GAD-7 scores were elevated and nurse felt like he needed to be evaluated. He has hx of anxiety/depression. He stopped medications because he did not want to be on "narcotics". He felt like on too much medication. Denies any SI/HC. He feels sad and worried a lot. He worries about his health and tired a lot. He has been on wellbutrin, celexa, prozac in the past.   Pt does question results of lung cancer screening done.   He admits he uses albuterol twice a day for cough.   .. Active Ambulatory Problems    Diagnosis Date Noted  . Mixed hyperlipidemia   . Hypertension   . Prediabetes   . Vitamin D deficiency   . DJD 10/23/2013  . DDD (degenerative disc disease), lumbosacral 10/23/2013  . Panic attacks 10/04/2014  . History of hepatitis C 10/08/2014  . Right inguinal hernia 10/08/2014  . GAD (generalized anxiety disorder) 11/01/2014  . Actinic keratosis 06/27/2015  . BPH without obstruction/lower urinary tract symptoms 11/25/2015  . Chronic pain of multiple joints 11/25/2015  . Fatty liver 10/01/2016  . Chronic  obstructive pulmonary disease ([place]) 12/29/2016  . Lumbar radiculopathy 01/18/2017  . Right carotid bruit 04/09/2019  . Right lower quadrant abdominal pain 04/09/2019  . Current smoker 04/09/2019  . Abdominal distension (gaseous) 04/11/2019  . Adrenal adenoma 04/17/2019  . Carotid stenosis, right 04/17/2019  . Centrilobular emphysema ([place]) 05/16/2019  . Moderate episode of recurrent major depressive disorder ([place]) 05/16/2019  . Aortic atherosclerosis ([place]) 05/16/2019  . Coronary artery disease due to calcified coronary lesion 05/16/2019  . Pulmonary nodules 05/16/2019   Resolved Ambulatory Problems    Diagnosis Date Noted  . Encounter for long-term (current) use of other medications 04/08/2014  . Olecranon bursitis 01/31/2015  . Urinary frequency 12/29/2016  . Routine physical examination 12/29/2016  . Right ankle pain 02/21/2017   Past Medical History:  Diagnosis Date  . Anxiety   . Arthritis   . Cancer ([place])   . COPD (chronic obstructive pulmonary disease) ([place])   . Depression   . Hepatitis   . Hyperlipidemia    Reviewed med, allergy, problem list.    Observations/Objective: No acute distress. Anxious mood.   .. Today's Vitals   05/16/19 1016  Temp: 98.2 F (36.8 C)  TempSrc: Oral  Weight: 176 lb (79.8 kg)  Height: 5\' 11"  (1.803 m)   Body mass index is 24.55 kg/m.   .. Depression screen [facility] 2/9 05/15/2019 04/09/2019 12/15/2018 12/29/2016  Decreased Interest 2 3 0 1  Down, Depressed, Hopeless 1 0 0 -  PHQ - 2 Score 3 3 0 1  Altered  sleeping 1 3 - -  Tired, decreased energy 2 2 - -  Change in appetite 0 0 - -  Feeling bad or failure about yourself  1 0 - -  Trouble concentrating 0 0 - -  Moving slowly or fidgety/restless 0 0 - -  Suicidal thoughts 0 0 - -  PHQ-9 Score 7 8 - -  Difficult doing work/chores Somewhat difficult Somewhat difficult - -   .[name] GAD 7 : Generalized Anxiety Score 05/16/2019 04/09/2019  Nervous, Anxious, on Edge 0 0  Control/stop  worrying 0 0  Worry too much - different things 0 0  Trouble relaxing 0 0  Restless 0 0  Easily annoyed or irritable 1 0  Afraid - awful might happen 0 0  Total GAD 7 Score 1 0  Anxiety Difficulty Not difficult at all Not difficult at all     Assessment and Plan: .Marland KitchenJohn was seen today for depression.  Diagnoses and all orders for this visit:  Moderate episode of recurrent major depressive disorder (HCC) -     DULoxetine (CYMBALTA) 30 MG capsule; Take 1 capsule (30 mg total) by mouth daily.  Centrilobular emphysema (HCC) -     albuterol (PROAIR HFA) 108 (90 Base) MCG/ACT inhaler; Inhale 1-2 puffs into the lungs every 6 (six) hours as needed for wheezing or shortness of breath. -     umeclidinium-vilanterol (ANORO ELLIPTA) 62.5-25 MCG/INH AEPB; Inhale 1 puff into the lungs daily.  GAD (generalized anxiety disorder) -     DULoxetine (CYMBALTA) 30 MG capsule; Take 1 capsule (30 mg total) by mouth daily.  Aortic atherosclerosis ([place]) -     Ambulatory referral to Cardiology  Coronary artery disease due to calcified coronary lesion -     Ambulatory referral to Cardiology  Carotid stenosis, right -     Ambulatory referral to Cardiology  Pulmonary nodules   Will start cymbalta daily. Follow up in 6 weeks.   Discussed lung CA screening CT. Concerned with calcification in LAD and RCA. Will send to cardiology for further work up. On lipitor.   Pulmonary nodules noted appeared benign follow up in 12 months.   emphysema present. Started anoro 1 puff daily with albuterol for rescue.   Pt declined smoking cessation.   Report from CT chest:  Cardiovascular: Heart size is normal. No pericardial effusion. Calcification the within the LAD, RCA coronary arteries noted. Aortic atherosclerosis. Follow Up Instructions:    I discussed the assessment and treatment plan with the patient. The patient was provided an opportunity to ask questions and all were answered. The patient agreed  with the plan and demonstrated an understanding of the instructions.   The patient was advised to call back or seek an in-person evaluation if the symptoms worsen or if the condition fails to improve as anticipated.  I provided 25 minutes of non-face-to-face time during this encounter.   Iran Planas, PA-C

## 2019-05-16 NOTE — Progress Notes (Signed)
Discuss depression. PHQ9 was done yesterday. GAD done today.

## 2019-05-23 ENCOUNTER — Telehealth: Payer: Self-pay | Admitting: Acute Care

## 2019-05-23 DIAGNOSIS — F17210 Nicotine dependence, cigarettes, uncomplicated: Secondary | ICD-10-CM

## 2019-05-23 DIAGNOSIS — Z87891 Personal history of nicotine dependence: Secondary | ICD-10-CM

## 2019-05-23 DIAGNOSIS — Z122 Encounter for screening for malignant neoplasm of respiratory organs: Secondary | ICD-10-CM

## 2019-05-23 NOTE — Telephone Encounter (Signed)
Called and spoke with pt letting him know that Langley Gauss will be back in office Friday 8/7 and would contact him then. Pt verbalized understanding. Routing to [place].

## 2019-05-25 NOTE — Telephone Encounter (Signed)
Pt informed of CT results per Sarah Groce, NP.  PT verbalized understanding.  Copy sent to PCP.  Order placed for 1 yr f/u CT.  

## 2019-05-30 NOTE — Progress Notes (Signed)
Cardiology Office Note:    Date:  05/31/2019   ID:  Edward Cuevas, DOB 04-04-1956, MRN [id number]  PCP:  Donella Stade, PA-C  Cardiologist:  Loel Dubonnet, NP   Referring MD: Donella Stade, PA-C  ASSESSMENT:    1. Coronary artery calcification seen on CT scan   2. Aortic atherosclerosis (HCC)   3. Carotid stenosis, right   4. Mixed hyperlipidemia   5. Essential hypertension   6. Dyspnea on exertion    PLAN:    In order of problems listed above:  1. Coronary artery calcification on CT scan - No chest pain. Chronic DOE detailed below. Risk factors include current smoking, HTN, HLD, family history. Plan for cardiac CTA for risk stratification. Start aspirin 81mg  daily. 2. Aortic atherosclerosis - Noted on CT scan. Plan for cardiac CTA, as above.  3.Carotid stenosis right - 50-59% on duplex 03/2019. Plan for risk factor modification including high intensity statin. Plan to repeat in 1 year. 4. HLD - 04/10/19 LDL 103. Goal of LDL <50 in setting of multiple calcifications. Anticipate will need Zetia and/or PCSK9. Stop Atorvastatin 10mg  - start Crestor 40mg  daily. Lipid and CMP in 4 weeks.  5. HTN - Follows with PCP. Stable today. Continue present antihypertensive regimen. 6. DOE - Present since 2014. Note history COPD on inhalers. Reports it is at baseline.  Next appointment: 6 weeks   Medication Adjustments/Labs and Tests Ordered: Current medicines are reviewed at length with the patient today.  Concerns regarding medicines are outlined above.  Orders Placed This Encounter  Procedures  . CT CORONARY MORPH W/CTA COR W/SCORE W/CA W/CM &/OR WO/CM  . CT CORONARY FRACTIONAL FLOW RESERVE DATA PREP  . CT CORONARY FRACTIONAL FLOW RESERVE FLUID ANALYSIS  . Comprehensive Metabolic Panel (CMET)  . Lipid Panel w/o Chol/HDL Ratio  . EKG 12-Lead   Meds ordered this encounter  Medications  . aspirin EC 81 MG tablet    Sig: Take 1 tablet (81 mg total) by mouth daily.    Dispense:  90  tablet    Refill:  3  . rosuvastatin (CRESTOR) 40 MG tablet    Sig: Take 1 tablet (40 mg total) by mouth daily.    Dispense:  90 tablet    Refill:  1  . metoprolol tartrate (LOPRESSOR) 50 MG tablet    Sig: Take 2 tablets two hours prior to cardiac CTA.    Dispense:  2 tablet    Refill:  0    Order Specific Question:   Supervising Provider    Answer:   Richardo Priest [[id number]]     Chief Complaint: 63 yo male with PMH HTN, HLD presents to establish cardiology care with noted coronary artery and aortic calcification on CT scan and carotid duplex with R sided stenosis 50-59%.  History of Present Illness:    Edward Cuevas is a 63 y.o. male with COPD, hypertension and hyperlipidemia on a high potency statin who is being seen today for the evaluation of aortic atherosclerosis at the request of Breeback, Jade L, PA-C.  He reports no chest pain. Reports DOE with onset 2014 shortly before he was released from work on medical disability. Reports it is about the same as his baseline.  He previously worked in Architect.   No exercise regimen. Limited by knee problems and shoulder problems and is s/p multiple orthopedic surgeries. He follows closely with dermatology for multiple spots of skin cancer due to years of working in the sun.  Risk factors for CAD include long term smoking (continues to smoke 1 PPD), family history of heart disease, HTN, HLD.   CT chest 05/15/2019:   IMPRESSION: 1. Lung-RADS 2, benign appearance or behavior. Continue annual screening with low-dose chest CT without contrast in 12 months. 2. Aortic Atherosclerosis (ICD10-I70.0) and Emphysema (ICD10-J43.9). 3. Coronary artery calcifications 4. Bilateral adrenal gland adenomas  Carotid duplex 04/16/2019:  IMPRESSION: 1. Moderate to large amount of right-sided atherosclerotic plaque results in elevated peak systolic velocities within the right internal carotid artery compatible with the higher end of the 50-69% luminal  narrowing range. Further evaluation with CTA could be performed as clinically indicated. 2. Minimal amount of left-sided atherosclerotic plaque, not resulting in a hemodynamically significant stenosis.  Past Medical History:  Diagnosis Date  . Anxiety   . Arthritis   . Cancer ([place])    skin  . COPD (chronic obstructive pulmonary disease) ([place])    per pt ?  [name] Depression   . Hepatitis    A,B,C  . Hyperlipidemia   . Hypertension   . Prediabetes   . Vitamin D deficiency     Past Surgical History:  Procedure Laterality Date  . ELBOW SURGERY Right 1986  . [place]  . HERNIA REPAIR Left 1996  . INGUINAL HERNIA REPAIR Right 12/30/2015   Procedure: RIGHT RIGHT INGUINAL HERNIA REPAIR;  Surgeon: Ralene Ok, MD;  Location: [facility];  Service: General;  Laterality: Right;  . INSERTION OF MESH Right 12/30/2015   Procedure: INSERTION OF MESH;  Surgeon: Ralene Ok, MD;  Location: [facility];  Service: General;  Laterality: Right;  . SHOULDER SURGERY Left 2007    Current Medications: Current Meds  Medication Sig  . acetaminophen (TYLENOL 8 HOUR ARTHRITIS PAIN) 650 MG CR tablet Take 650 mg by mouth 2 (two) times daily.  [name] albuterol (PROAIR HFA) 108 (90 Base) MCG/ACT inhaler Inhale 1-2 puffs into the lungs every 6 (six) hours as needed for wheezing or shortness of breath.  . DULoxetine (CYMBALTA) 30 MG capsule Take 1 capsule (30 mg total) by mouth daily.  [name] gabapentin (NEURONTIN) 300 MG capsule Take 2 capsules (600 mg total) by mouth 2 (two) times daily.  [name] linaclotide (LINZESS) 290 MCG CAPS capsule Take 1 capsule (290 mcg total) by mouth daily.  [name] MYRBETRIQ 50 MG TB24 tablet TK 1 T PO QD  . terazosin (HYTRIN) 1 MG capsule Take 1 capsule (1 mg total) by mouth at bedtime.  [name] umeclidinium-vilanterol (ANORO ELLIPTA) 62.5-25 MCG/INH AEPB Inhale 1 puff into the lungs daily.  . [DISCONTINUED] atorvastatin (LIPITOR) 10 MG tablet Take 1 tablet (10 mg total) by mouth daily.      Allergies:   Penicillins   Social History   Socioeconomic History  . Marital status: Divorced    Spouse name: Not on file  . Number of children: 2  . Years of education: 26  . Highest education level: 11th grade  Occupational History  . 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 Financial resource strain: Not hard at all  . Food insecurity    Worry: Never true    Inability: Never true  . Transportation needs    Medical: No    Non-medical: No  Tobacco Use  . Smoking status: Current Every Day Smoker    Packs/day: 1.00    Years: 30.00    Pack years: 30.00    Types: Cigarettes  . Smokeless tobacco: Never Used  Substance and Sexual Activity  . Alcohol use: Yes    Alcohol/week: 3.0 standard drinks    Types: 3 Cans of beer per week    Comment: daily  . Drug use: No  . Sexual activity: Not Currently  Lifestyle  . Physical activity    Days per week: 0 days    Minutes per session: 0 min  . Stress: Not at all  Relationships  . Social Herbalist on phone: Once a week    Gets together: Never    Attends religious service: Never    Active member of club or organization: No    Attends meetings of clubs or organizations: Never    Relationship status: Divorced  Other Topics Concern  . Not on file  Social History Narrative  . Not on file     Family History: The patient's family history includes Diabetes in his mother; Heart attack in his father; Heart disease in his father and maternal grandmother.  ROS:   Review of Systems  Constitution: Negative for chills, fever and malaise/fatigue.  Cardiovascular: Positive for dyspnea on exertion. Negative for chest pain, irregular heartbeat, leg swelling, near-syncope, orthopnea and palpitations.  Respiratory: Negative for cough, shortness of breath and wheezing.   Musculoskeletal:       Chronic knee pain  Gastrointestinal: Negative for nausea and vomiting.  Neurological: Negative for dizziness,  light-headedness and weakness.   Please see the history of present illness.     All other systems reviewed and are negative.  EKGs/Labs/Other Studies Reviewed:    The following studies were reviewed today:  Carotid Duplex CT chest scan  EKG:  EKG is ordered today.  The ekg ordered today is personally reviewed and demonstrates sinus rhythm with no acute ST/T wave changes.   Recent Labs: 04/10/2019: ALT 30; BUN 14; Creat 1.03; Hemoglobin 17.1; Platelets 198; Potassium 4.4; Sodium 139; TSH 2.88  Recent Lipid Panel    Component Value Date/Time   CHOL 165 04/10/2019 0811   TRIG 56 04/10/2019 0811   HDL 48 04/10/2019 0811   CHOLHDL 3.4 04/10/2019 0811   VLDL 13 08/24/2016 1347   LDLCALC 103 (H) 04/10/2019 0811    Physical Exam:    VS:  BP 122/68 (BP Location: Left Arm, Patient Position: Sitting, Cuff Size: Normal)   Pulse 66   Ht 6' (1.829 m)   Wt 176 lb 12.8 oz (80.2 kg)   SpO2 96%   BMI 23.98 kg/m     Wt Readings from Last 3 Encounters:  05/31/19 176 lb 12.8 oz (80.2 kg)  05/16/19 176 lb (79.8 kg)  05/15/19 176 lb (79.8 kg)     GEN:  Well nourished, well developed in no acute distress HEENT: Normal NECK: No JVD; No carotid bruits LYMPHATICS: No lymphadenopathy CARDIAC: RRR, no murmurs, rubs, gallops RESPIRATORY:  Clear to auscultation without rales, wheezing or rhonchi. Diminished bilaterally in the bases. ABDOMEN: Soft, non-tender, non-distended MUSCULOSKELETAL:  No edema; No deformity  SKIN: Warm and dry, noted scars from history of skin cancer with multiple areas of discoloration (follows with dermatology) NEUROLOGIC:  Alert and oriented x 3 PSYCHIATRIC:  Normal affect     Signed, Loel Dubonnet, NP  05/31/2019 11:32 AM    [place]

## 2019-05-31 ENCOUNTER — Other Ambulatory Visit: Payer: Self-pay

## 2019-05-31 ENCOUNTER — Encounter: Payer: Self-pay | Admitting: Cardiology

## 2019-05-31 ENCOUNTER — Ambulatory Visit (INDEPENDENT_AMBULATORY_CARE_PROVIDER_SITE_OTHER): Payer: PPO | Admitting: Cardiology

## 2019-05-31 VITALS — BP 122/68 | HR 66 | Ht 72.0 in | Wt 176.8 lb

## 2019-05-31 DIAGNOSIS — I700 Atherosclerosis of aorta: Secondary | ICD-10-CM

## 2019-05-31 DIAGNOSIS — R0609 Other forms of dyspnea: Secondary | ICD-10-CM

## 2019-05-31 DIAGNOSIS — I2510 Atherosclerotic heart disease of native coronary artery without angina pectoris: Secondary | ICD-10-CM | POA: Diagnosis not present

## 2019-05-31 DIAGNOSIS — I6521 Occlusion and stenosis of right carotid artery: Secondary | ICD-10-CM

## 2019-05-31 DIAGNOSIS — E782 Mixed hyperlipidemia: Secondary | ICD-10-CM | POA: Diagnosis not present

## 2019-05-31 DIAGNOSIS — I10 Essential (primary) hypertension: Secondary | ICD-10-CM | POA: Diagnosis not present

## 2019-05-31 HISTORY — DX: Other forms of dyspnea: R06.09

## 2019-05-31 MED ORDER — ROSUVASTATIN CALCIUM 40 MG PO TABS: 40.0000 mg | ORAL_TABLET | Freq: Every day | ORAL | 1 refills | Status: DC

## 2019-05-31 MED ORDER — METOPROLOL TARTRATE 50 MG PO TABS: ORAL_TABLET | ORAL | 0 refills | Status: DC

## 2019-05-31 MED ORDER — ASPIRIN EC 81 MG PO TBEC: 81.0000 mg | DELAYED_RELEASE_TABLET | Freq: Every day | ORAL | 3 refills | Status: AC

## 2019-05-31 NOTE — Patient Instructions (Addendum)
Medication Instructions:  Your physician has recommended you make the following change in your medication:  STOP Atorvastatin   START Crestor 40mg  daily START Aspirin EC 81mg  daily  If you need a refill on your cardiac medications before your next appointment, please call your pharmacy.   Lab work: Your physician recommends that you return for lab work in 4 weeks: CMET, fasting lipid profile  In 4 weeks please stop by the [facility] office for labs. We would like your lipid profile to be fasting, so please stop by in the morning 8am-11am before eating that day, please.   This will check your liver function and cholesterol levels after change in your medication.  If you have labs (blood work) drawn today and your tests are completely normal, you will receive your results only by: [name] MyChart Message (if you have MyChart) OR . A paper copy in the mail If you have any lab test that is abnormal or we need to change your treatment, we will call you to review the results.  Testing/Procedures: You had an EKG today.   Your physician has requested that you have cardiac CT. Cardiac computed tomography (CT) is a painless test that uses an x-ray machine to take clear, detailed pictures of your heart. For further information please visit [email or web address]. Please follow instruction sheet as given.   Follow-Up: At [facility], you and your health needs are our priority.  As part of our continuing mission to provide you with exceptional heart care, we have created designated Provider Care Teams.  These Care Teams include your primary Cardiologist (physician) and Advanced Practice Providers (APPs -  Physician Assistants and Nurse Practitioners) who all work together to provide you with the care you need, when you need it. You will need a follow up appointment in 6 weeks.    Any Other Special Instructions Will Be Listed Below (If Applicable).  Smoking cessation is strongly encouraged.   You have  evidence of plaque on your right carotid artery, aorta, and coronary arteries. The cardiac CTA will help to tell us if this plaque is causing impaired blood flow in your heart. Our primary focus is risk prevention. This comes with lowering your cholesterol with statin medications, baby aspirin, smoking cessation, a heart healthy diet, and regular exercise.   ----------  Your cardiac CT will be scheduled at :   [facility] [place], [place] [address] [phone]  Please arrive at the [facility] main entrance [facility] 30-45 minutes prior to test start time. Proceed to the [facility] Radiology Department (first floor) to check-in and test prep.  Please follow these instructions carefully (unless otherwise directed):  Hold all erectile dysfunction medications at least 48 hours prior to test.  On the Night Before the Test: . Be sure to Drink plenty of water. . Do not consume any caffeinated/decaffeinated beverages or chocolate 12 hours prior to your test. . Do not take any antihistamines 12 hours prior to your test.  On the Day of the Test: . Drink plenty of water. Do not drink any water within one hour of the test. . Do not eat any food 4 hours prior to the test. . You may take your regular medications prior to the test.  . Take metoprolol (Lopressor) two hours prior to test. . HOLD Myrbetriq morning of the test.      After the Test: . Drink plenty of water. . After receiving IV contrast, you may  experience a mild flushed feeling. This is normal. . On occasion, you may experience a mild rash up to 24 hours after the test. This is not dangerous. If this occurs, you can take Benadryl 25 mg and increase your fluid intake. . If you experience trouble breathing, this can be serious. If it is severe call 911 IMMEDIATELY. If it is mild, please call our office. . If you take any of these medications: Glipizide/Metformin, Avandament, Glucavance,  please do not take 48 hours after completing test.   Please contact the cardiac imaging nurse navigator should you have any questions/concerns Marchia Bond, RN Navigator [facility] [place] [facility] [place] [facility] [phone] Office  [phone] Cell

## 2019-06-01 ENCOUNTER — Other Ambulatory Visit: Payer: Self-pay | Admitting: Cardiology

## 2019-06-01 NOTE — Telephone Encounter (Signed)
°*  STAT* If patient is at the pharmacy, call can be transferred to refill team.   1. Which medications need to be refilled? (please list name of each medication and dose if known) Metoprolol 50mg   2. Which pharmacy/location (including street and city if local pharmacy) is medication to be sent to?[facility] [address] [place]  3. Do they need a 30 day or 90 day supply? 2

## 2019-06-01 NOTE — Telephone Encounter (Signed)
Telephone call to patient to clarify metoprolol prescription. No answer and no voicemail. Will continue efforts.

## 2019-06-04 ENCOUNTER — Other Ambulatory Visit: Payer: Self-pay | Admitting: Cardiology

## 2019-06-04 NOTE — Telephone Encounter (Signed)
°*  STAT* If patient is at the pharmacy, call can be transferred to refill team.   1. Which medications need to be refilled? (please list name of each medication and dose if known) metoprolol tartrate (LOPRESSOR) 50 MG tablet  2. Which pharmacy/location (including street and city if local pharmacy) is medication to be sent to?  [facility] [place], [place] [phone] (Phone) [phone] (Fax)    3. Do they need a 30 day or 90 day supply? 90 day

## 2019-06-04 NOTE — Telephone Encounter (Signed)
Duplicate message. 

## 2019-06-04 NOTE — Telephone Encounter (Signed)
Spoke with patient regarding metoprolol and he states that he has already picked up Rx for metoprolol prior to having Cardiac CTA.  He does not need medication sent to pharmacy.

## 2019-06-07 ENCOUNTER — Telehealth: Payer: Self-pay | Admitting: Neurology

## 2019-06-07 NOTE — Telephone Encounter (Signed)
Patient left vm stating having side effects with Anora ellipta inhaler and asking for different RX. Wants something less expensive.   I called patient to find out what side effects he is having. Left message on machine for patient to call back.

## 2019-06-07 NOTE — Telephone Encounter (Signed)
He called back and states insomnia and trouble with urination worse. Please advise on alternative inhaler.

## 2019-06-08 MED ORDER — TRELEGY ELLIPTA 100-62.5-25 MCG/INH IN AEPB: 1.0000 | INHALATION_SPRAY | Freq: Every day | RESPIRATORY_TRACT | 2 refills | Status: DC

## 2019-06-08 NOTE — Telephone Encounter (Signed)
Patient called back and left vm stating the new inhaler is $142 a month. He can not afford it. Please advise.

## 2019-06-08 NOTE — Telephone Encounter (Signed)
Patient made aware. Will let us know if it is too expensive.

## 2019-06-08 NOTE — Telephone Encounter (Signed)
Ok I sent trelegy one puff once a day.

## 2019-06-11 MED ORDER — SPIRIVA RESPIMAT 2.5 MCG/ACT IN AERS: 2.0000 | INHALATION_SPRAY | Freq: Every day | RESPIRATORY_TRACT | 5 refills | Status: DC

## 2019-06-11 MED ORDER — BUDESONIDE-FORMOTEROL FUMARATE 160-4.5 MCG/ACT IN AERO: 2.0000 | INHALATION_SPRAY | Freq: Two times a day (BID) | RESPIRATORY_TRACT | 5 refills | Status: DC

## 2019-06-11 NOTE — Addendum Note (Signed)
Addended by: Donella Stade on: 06/11/2019 04:45 PM   Modules accepted: Orders

## 2019-06-11 NOTE — Telephone Encounter (Signed)
Had to send 2 inhalers to replace one.  Spiriva 2 puffs once a day.  Symbicort 2 puffs twice a day.

## 2019-06-12 NOTE — Telephone Encounter (Signed)
Patient made aware. He has checked with the pharmacy and these are both around $80 as well. He can not afford. He wants to stick with Albuterol.

## 2019-06-12 NOTE — Addendum Note (Signed)
Addended byAnnamaria Helling on: 06/12/2019 09:18 AM   Modules accepted: Orders

## 2019-06-26 DIAGNOSIS — Z85828 Personal history of other malignant neoplasm of skin: Secondary | ICD-10-CM | POA: Diagnosis not present

## 2019-06-26 DIAGNOSIS — C44519 Basal cell carcinoma of skin of other part of trunk: Secondary | ICD-10-CM | POA: Diagnosis not present

## 2019-06-26 DIAGNOSIS — C44622 Squamous cell carcinoma of skin of right upper limb, including shoulder: Secondary | ICD-10-CM | POA: Diagnosis not present

## 2019-06-26 DIAGNOSIS — D485 Neoplasm of uncertain behavior of skin: Secondary | ICD-10-CM | POA: Diagnosis not present

## 2019-07-04 ENCOUNTER — Other Ambulatory Visit: Payer: PPO

## 2019-07-06 ENCOUNTER — Telehealth (HOSPITAL_COMMUNITY): Payer: Self-pay | Admitting: Emergency Medicine

## 2019-07-06 NOTE — Telephone Encounter (Signed)
Left message on voicemail with name and callback number Shamona Wirtz RN Navigator Cardiac [facility] [phone] Office [phone] Cell  

## 2019-07-09 ENCOUNTER — Ambulatory Visit (HOSPITAL_COMMUNITY)
Admission: RE | Admit: 2019-07-09 | Discharge: 2019-07-09 | Disposition: A | Discharge status: Home / Self Care | Payer: PPO | Source: Ambulatory Visit | Attending: Cardiology | Admitting: Cardiology

## 2019-07-09 ENCOUNTER — Other Ambulatory Visit: Payer: Self-pay

## 2019-07-09 DIAGNOSIS — E782 Mixed hyperlipidemia: Secondary | ICD-10-CM | POA: Diagnosis not present

## 2019-07-09 DIAGNOSIS — R0609 Other forms of dyspnea: Secondary | ICD-10-CM | POA: Diagnosis not present

## 2019-07-09 DIAGNOSIS — I700 Atherosclerosis of aorta: Secondary | ICD-10-CM | POA: Diagnosis not present

## 2019-07-09 DIAGNOSIS — I10 Essential (primary) hypertension: Secondary | ICD-10-CM | POA: Diagnosis not present

## 2019-07-09 DIAGNOSIS — I2510 Atherosclerotic heart disease of native coronary artery without angina pectoris: Secondary | ICD-10-CM

## 2019-07-09 LAB — POCT I-STAT CREATININE: Creatinine, Ser: 0.9 mg/dL (ref 0.61–1.24)

## 2019-07-09 MED ORDER — NITROGLYCERIN 0.4 MG SL SUBL
0.8000 mg | SUBLINGUAL_TABLET | Freq: Once | SUBLINGUAL | Status: AC
  Administered 2019-07-09: 0.8 mg via SUBLINGUAL
  Filled 2019-07-09: qty 25

## 2019-07-09 MED ORDER — SODIUM CHLORIDE 0.9 % IV BOLUS
500.0000 mL | Freq: Once | INTRAVENOUS | Status: AC
  Administered 2019-07-09: 500 mL via INTRAVENOUS

## 2019-07-09 MED ORDER — NITROGLYCERIN 0.4 MG SL SUBL
SUBLINGUAL_TABLET | SUBLINGUAL | Status: AC
  Filled 2019-07-09: qty 2

## 2019-07-09 MED ORDER — IOHEXOL 350 MG/ML SOLN
100.0000 mL | Freq: Once | INTRAVENOUS | Status: AC | PRN
  Administered 2019-07-09: 08:00:00 100 mL via INTRAVENOUS

## 2019-07-10 ENCOUNTER — Ambulatory Visit (HOSPITAL_COMMUNITY)
Admission: RE | Admit: 2019-07-10 | Discharge: 2019-07-10 | Disposition: A | Discharge status: Home / Self Care | Payer: PPO | Source: Ambulatory Visit | Attending: Cardiology | Admitting: Cardiology

## 2019-07-10 ENCOUNTER — Other Ambulatory Visit: Payer: Self-pay | Admitting: Physician Assistant

## 2019-07-10 DIAGNOSIS — I10 Essential (primary) hypertension: Secondary | ICD-10-CM | POA: Diagnosis not present

## 2019-07-10 DIAGNOSIS — E782 Mixed hyperlipidemia: Secondary | ICD-10-CM | POA: Insufficient documentation

## 2019-07-10 DIAGNOSIS — R0609 Other forms of dyspnea: Secondary | ICD-10-CM | POA: Insufficient documentation

## 2019-07-10 DIAGNOSIS — I700 Atherosclerosis of aorta: Secondary | ICD-10-CM | POA: Diagnosis not present

## 2019-07-10 DIAGNOSIS — F331 Major depressive disorder, recurrent, moderate: Secondary | ICD-10-CM

## 2019-07-10 DIAGNOSIS — F411 Generalized anxiety disorder: Secondary | ICD-10-CM

## 2019-07-10 DIAGNOSIS — I2510 Atherosclerotic heart disease of native coronary artery without angina pectoris: Secondary | ICD-10-CM | POA: Diagnosis not present

## 2019-07-11 ENCOUNTER — Encounter: Payer: Self-pay | Admitting: Physician Assistant

## 2019-07-11 ENCOUNTER — Ambulatory Visit (INDEPENDENT_AMBULATORY_CARE_PROVIDER_SITE_OTHER): Payer: PPO | Admitting: Physician Assistant

## 2019-07-11 ENCOUNTER — Other Ambulatory Visit: Payer: Self-pay

## 2019-07-11 VITALS — BP 151/77 | HR 76 | Ht 71.0 in | Wt 179.0 lb

## 2019-07-11 DIAGNOSIS — N3281 Overactive bladder: Secondary | ICD-10-CM

## 2019-07-11 DIAGNOSIS — F410 Panic disorder [episodic paroxysmal anxiety] without agoraphobia: Secondary | ICD-10-CM

## 2019-07-11 DIAGNOSIS — J432 Centrilobular emphysema: Secondary | ICD-10-CM

## 2019-07-11 DIAGNOSIS — F411 Generalized anxiety disorder: Secondary | ICD-10-CM

## 2019-07-11 DIAGNOSIS — F331 Major depressive disorder, recurrent, moderate: Secondary | ICD-10-CM

## 2019-07-11 DIAGNOSIS — Z23 Encounter for immunization: Secondary | ICD-10-CM | POA: Diagnosis not present

## 2019-07-11 LAB — POCT URINALYSIS DIPSTICK
Bilirubin, UA: NEGATIVE
Blood, UA: NEGATIVE
Glucose, UA: NEGATIVE
Ketones, UA: NEGATIVE
Leukocytes, UA: NEGATIVE
Nitrite, UA: NEGATIVE
Protein, UA: NEGATIVE
Spec Grav, UA: 1.01 (ref 1.010–1.025)
Urobilinogen, UA: 0.2 E.U./dL
pH, UA: 6 (ref 5.0–8.0)

## 2019-07-11 MED ORDER — OXYBUTYNIN CHLORIDE 5 MG PO TABS: 5.0000 mg | ORAL_TABLET | Freq: Three times a day (TID) | ORAL | 2 refills | Status: DC

## 2019-07-11 MED ORDER — DULOXETINE HCL 60 MG PO CPEP: 60.0000 mg | ORAL_CAPSULE | Freq: Every day | ORAL | 0 refills | Status: DC

## 2019-07-11 NOTE — Patient Instructions (Signed)
Start oxybutynin up to three times a day.  Increased Cymbalta.

## 2019-07-11 NOTE — Progress Notes (Signed)
Subjective:    Patient ID: Edward Cuevas, male    DOB: [birth year], 63 y.o.   MRN: [id number]  HPI  Pt is a 63 yo male with CAD, COPD, GAD, MDD who presents to the clinic to follow up on cymbalta.   He does admit his mood is much better on cymbalta. He is doing "really well". He thinks a little increase would help a little more. No SI/HC.   He has followed up with cardiology and had some scans done to make sure no blockages. Denies any CP or tightness today.   He continues to have frequent urination and lower abdominal pain. This has been going on for "years".  He takes his prostate medication. myrbetriq was too expensive and did not seem to work. He has seen urology and had work up.he denies any blood in urine. Symptoms were worse when on Trelegy and after contrast for cardiac imaging.   COPD- he hates ALL preventative inhalers he has tried. He is not willing to try anything else today. He does continue to use albuterol as needed. He has some SOB but he does most everything he wants.pt continues to smoke daily.   .. Active Ambulatory Problems    Diagnosis Date Noted  . Mixed hyperlipidemia   . Hypertension   . Prediabetes   . Vitamin D deficiency   . DJD 10/23/2013  . DDD (degenerative disc disease), lumbosacral 10/23/2013  . Panic attacks 10/04/2014  . History of hepatitis C 10/08/2014  . Right inguinal hernia 10/08/2014  . GAD (generalized anxiety disorder) 11/01/2014  . Actinic keratosis 06/27/2015  . BPH without obstruction/lower urinary tract symptoms 11/25/2015  . Chronic pain of multiple joints 11/25/2015  . Fatty liver 10/01/2016  . Chronic obstructive pulmonary disease ([place]) 12/29/2016  . Lumbar radiculopathy 01/18/2017  . Right carotid bruit 04/09/2019  . Right lower quadrant abdominal pain 04/09/2019  . Current smoker 04/09/2019  . Abdominal distension (gaseous) 04/11/2019  . Adrenal adenoma 04/17/2019  . Carotid stenosis, right 04/17/2019  . Centrilobular emphysema  ([place]) 05/16/2019  . Moderate episode of recurrent major depressive disorder ([place]) 05/16/2019  . Aortic atherosclerosis ([place]) 05/16/2019  . Coronary artery disease due to calcified coronary lesion 05/16/2019  . Pulmonary nodules 05/16/2019  . Dyspnea on exertion 05/31/2019  . OAB (overactive bladder) 07/12/2019   Resolved Ambulatory Problems    Diagnosis Date Noted  . Encounter for long-term (current) use of other medications 04/08/2014  . Olecranon bursitis 01/31/2015  . Urinary frequency 12/29/2016  . Routine physical examination 12/29/2016  . Right ankle pain 02/21/2017   Past Medical History:  Diagnosis Date  . Anxiety   . Arthritis   . Cancer ([place])   . COPD (chronic obstructive pulmonary disease) ([place])   . Depression   . Hepatitis   . Hyperlipidemia      Review of Systems  All other systems reviewed and are negative.      Objective:   Physical Exam Vitals signs reviewed.  Constitutional:      Appearance: Normal appearance.  HENT:     Head: Normocephalic.  Cardiovascular:     Rate and Rhythm: Normal rate and regular rhythm.     Pulses: Normal pulses.  Pulmonary:     Effort: Pulmonary effort is normal.     Comments: Coarse breath sounds.  Abdominal:     General: Bowel sounds are normal. There is no distension.     Palpations: Abdomen is soft.     Tenderness: There is no  right CVA tenderness, left CVA tenderness, guarding or rebound.     Comments: Some diffuse tenderness bilateral lower abdomen very mild.   Neurological:     General: No focal deficit present.     Mental Status: He is alert.  Psychiatric:        Mood and Affect: Mood normal.       .. Depression screen [facility] 2/9 07/11/2019 05/15/2019 04/09/2019 12/15/2018 12/29/2016  Decreased Interest 1 2 3  0 1  Down, Depressed, Hopeless 1 1 0 0 -  PHQ - 2 Score 2 3 3  0 1  Altered sleeping 0 1 3 - -  Tired, decreased energy 2 2 2  - -  Change in appetite 1 0 0 - -  Feeling bad or failure about yourself  0  1 0 - -  Trouble concentrating 0 0 0 - -  Moving slowly or fidgety/restless 0 0 0 - -  Suicidal thoughts 0 0 0 - -  PHQ-9 Score 5 7 8  - -  Difficult doing work/chores Somewhat difficult Somewhat difficult Somewhat difficult - -   .[name] GAD 7 : Generalized Anxiety Score 07/11/2019 05/16/2019 04/09/2019  Nervous, Anxious, on Edge 0 0 0  Control/stop worrying 0 0 0  Worry too much - different things 0 0 0  Trouble relaxing 0 0 0  Restless 0 0 0  Easily annoyed or irritable 0 1 0  Afraid - awful might happen 0 0 0  Total GAD 7 Score 0 1 0  Anxiety Difficulty Not difficult at all Not difficult at all Not difficult at all        Assessment & Plan:   .Marland KitchenJohn was seen today for depression and over active bladder.  Diagnoses and all orders for this visit:  OAB (overactive bladder) -     oxybutynin (DITROPAN) 5 MG tablet; Take 1 tablet (5 mg total) by mouth 3 (three) times daily. -     POCT urinalysis dipstick -     Urinalysis, Routine w reflex microscopic  Flu vaccine need -     Flu Vaccine QUAD 36+ mos IM  Centrilobular emphysema (HCC)  GAD (generalized anxiety disorder) -     DULoxetine (CYMBALTA) 60 MG capsule; Take 1 capsule (60 mg total) by mouth daily.  Moderate episode of recurrent major depressive disorder (HCC) -     DULoxetine (CYMBALTA) 60 MG capsule; Take 1 capsule (60 mg total) by mouth daily.  Panic attacks -     DULoxetine (CYMBALTA) 60 MG capsule; Take 1 capsule (60 mg total) by mouth daily.     .. Results for orders placed or performed in visit on 07/11/19  Urinalysis, Routine w reflex microscopic  Result Value Ref Range   Color, Urine YELLOW YELLOW   APPearance CLEAR CLEAR   Specific Gravity, Urine 1.010 1.001 - 1.03   pH 6.0 5.0 - 8.0   Glucose, UA NEGATIVE NEGATIVE   Bilirubin Urine NEGATIVE NEGATIVE   Ketones, ur NEGATIVE NEGATIVE   Hgb urine dipstick NEGATIVE NEGATIVE   Protein, ur NEGATIVE NEGATIVE   Nitrite NEGATIVE NEGATIVE   Leukocytes,Ua  NEGATIVE NEGATIVE  POCT urinalysis dipstick  Result Value Ref Range   Color, UA yellow    Clarity, UA clear    Glucose, UA Negative Negative   Bilirubin, UA negative    Ketones, UA negative    Spec Grav, UA 1.010 1.010 - 1.025   Blood, UA negative    pH, UA 6.0 5.0 - 8.0   Protein,  UA Negative Negative   Urobilinogen, UA 0.2 0.2 or 1.0 E.U./dL   Nitrite, UA negative    Leukocytes, UA Negative Negative   Appearance     Odor     Pts mood has improved A LOT. At his request increased to 60mg  daily. Follow up in 6 months.   UA dipstick normal. mircoscopic ordered. Pt has had urology work up. PSA is normal and stable. Tried mybetriq. Will try oxybutynin. Discussed side effects.   COPD stable for now. Albuterol as needed. Pt not willing to consider daily inhalers.  Pt refused smoking cessation.   CAD- no significant stenosis found on CT of coronary arteries. Great news. On crestor.

## 2019-07-12 ENCOUNTER — Telehealth: Payer: Self-pay | Admitting: *Deleted

## 2019-07-12 DIAGNOSIS — N3281 Overactive bladder: Secondary | ICD-10-CM | POA: Insufficient documentation

## 2019-07-12 HISTORY — DX: Overactive bladder: N32.81

## 2019-07-12 LAB — URINALYSIS, ROUTINE W REFLEX MICROSCOPIC
Bilirubin Urine: NEGATIVE
Glucose, UA: NEGATIVE
Hgb urine dipstick: NEGATIVE
Ketones, ur: NEGATIVE
Leukocytes,Ua: NEGATIVE
Nitrite: NEGATIVE
Protein, ur: NEGATIVE
Specific Gravity, Urine: 1.01 (ref 1.001–1.03)
pH: 6 (ref 5.0–8.0)

## 2019-07-12 MED ORDER — NITROGLYCERIN 0.4 MG SL SUBL: 0.4000 mg | SUBLINGUAL_TABLET | SUBLINGUAL | 3 refills | Status: DC | PRN

## 2019-07-12 NOTE — Telephone Encounter (Signed)
Patient informed of cardiac CTA results and advised to follow up as scheduled with Dr. Bettina Gavia on Monday, 07/16/2019, at 11:20 am in the [facility] office. Patient confirmed that he is taking rosuvastatin 40 mg daily. He does not have a nitroglycerin prescription at this time. Educated patient on how to take this medication if needed and sent prescription to [facility] in [place] as requested. Patient verbalized understanding and is agreeable. No further questions.

## 2019-07-12 NOTE — Telephone Encounter (Signed)
-----   Message from Richardo Priest, MD sent at 07/10/2019  6:19 PM EDT ----- CTA shows CAD not severe does not need Cath stent or CABG. Continue current meds including statin NTG 1/150 Gr prescription if he does not have just if needed to avoid ED visit during Covid

## 2019-07-12 NOTE — Telephone Encounter (Signed)
Left message for patient to return call to discuss cardiac CTA results.

## 2019-07-12 NOTE — Addendum Note (Signed)
Addended by: Austin Miles on: 07/12/2019 10:39 AM   Modules accepted: Orders

## 2019-07-14 NOTE — Progress Notes (Signed)
Cardiology Office Note:    Date:  07/16/2019   ID:  Edward Cuevas, DOB 05/05/1956, MRN [id number]  PCP:  Donella Stade, PA-C  Cardiologist:  Shirlee More, MD    Referring MD: Donella Stade, PA-C    ASSESSMENT:    1. Coronary artery disease of native artery of native heart with stable angina pectoris ([place])   2. Coronary artery disease due to calcified coronary lesion   3. Mixed hyperlipidemia    PLAN:    In order of problems listed above:  1. Stable New York Heart Association class I by CTA moderate LAD stenosis FFR normal having no angina would advise coronary angiography or intervention and continue medical therapy including antiplatelet aspirin and high intensity statin.  In the absence of frequent anginal episodes [place] in the office in 1 year. 2. Stable dyslipidemia continue his high intensity statin check labs today for liver function LDL and lipoprotein a level goal LDL less than 55.  If unable to achieve with a high dose of high intensity the addition of Zetia would be appropriate.   Next appointment: 1 year   Medication Adjustments/Labs and Tests Ordered: Current medicines are reviewed at length with the patient today.  Concerns regarding medicines are outlined above.  Orders Placed This Encounter  Procedures   Comprehensive Metabolic Panel (CMET)   Lipid Profile   Lipoprotein A (LPA)   No orders of the defined types were placed in this encounter.   No chief complaint on file.   History of Present Illness:    Edward Cuevas is a 63 y.o. male with a hx of carotid artery stenosis hypertension and hyperlipidemia last seen 05/31/2019 for exertional shortness of breath and coronary artery calcification seen on CT scan.  Cardiac CTA showed moderate stenosis 50 to 69% in the proximal LAD non-flow-limiting on FFR.  With residual elevation of LDL he was transitioned from atorvastatin to rosuvastatin with plan for follow-up lipid and CMP at this office visit and  consideration of PCSK9 therapy or Zetia to achieve target. Compliance with diet, lifestyle and medications: Yes, he tolerates a statin  I had an opportunity to print the report of his cardiac CTA and reviewed with the patient.  His calcium score is high fortunately he does not have flow-limiting coronary stenosis and has no angina.  He is on appropriate medical therapy including a high intensity statin and aspirin New York Heart Association 1.  We will recheck his lipid profile CMP lipoprotein a level today and I will plan to see him in 1 year unless he is having anginal discomfort. Past Medical History:  Diagnosis Date   Anxiety    Arthritis    Cancer ([place])    skin   COPD (chronic obstructive pulmonary disease) ([place])    per pt ?   Depression    Hepatitis    A,B,C   Hyperlipidemia    Hypertension    Prediabetes    Vitamin D deficiency     Past Surgical History:  Procedure Laterality Date   ELBOW SURGERY Right 1986   [place] and 1980   HERNIA REPAIR Left 1996   INGUINAL HERNIA REPAIR Right 12/30/2015   Procedure: RIGHT RIGHT INGUINAL HERNIA REPAIR;  Surgeon: Ralene Ok, MD;  Location: [place];  Service: General;  Laterality: Right;   INSERTION OF MESH Right 12/30/2015   Procedure: INSERTION OF MESH;  Surgeon: Ralene Ok, MD;  Location: [place];  Service: General;  Laterality: Right;  SHOULDER SURGERY Left 2007    Current Medications: Current Meds  Medication Sig   acetaminophen (TYLENOL 8 HOUR ARTHRITIS PAIN) 650 MG CR tablet Take 650 mg by mouth 2 (two) times daily.   albuterol (PROAIR HFA) 108 (90 Base) MCG/ACT inhaler Inhale 1-2 puffs into the lungs every 6 (six) hours as needed for wheezing or shortness of breath.   aspirin EC 81 MG tablet Take 1 tablet (81 mg total) by mouth daily.   DULoxetine (CYMBALTA) 60 MG capsule Take 1 capsule (60 mg total) by mouth daily.   gabapentin (NEURONTIN) 300 MG capsule Take 2 capsules (600 mg  total) by mouth 2 (two) times daily.   linaclotide (LINZESS) 290 MCG CAPS capsule Take 1 capsule (290 mcg total) by mouth daily.   nitroGLYCERIN (NITROSTAT) 0.4 MG SL tablet Place 1 tablet (0.4 mg total) under the tongue every 5 (five) minutes as needed for chest pain.   oxybutynin (DITROPAN) 5 MG tablet Take 1 tablet (5 mg total) by mouth 3 (three) times daily.   rosuvastatin (CRESTOR) 40 MG tablet Take 1 tablet (40 mg total) by mouth daily.   terazosin (HYTRIN) 1 MG capsule Take 1 capsule (1 mg total) by mouth at bedtime.     Allergies:   Penicillins   Social History   Socioeconomic History   Marital status: Divorced    Spouse name: Not on file   Number of children: 2   Years of education: 11   Highest education level: 11th grade  Occupational History   Occupation: superintendant    Comment: retired  Scientist, product/process development strain: Not hard at [employer] insecurity    Worry: Never true    Inability: Never true   Transportation needs    Medical: No    Non-medical: No  Tobacco Use   Smoking status: Current Every Day Smoker    Packs/day: 1.00    Years: 30.00    Pack years: 30.00    Types: Cigarettes   Smokeless tobacco: Never Used  Substance and Sexual Activity   Alcohol use: Yes    Alcohol/week: 3.0 standard drinks    Types: 3 Cans of beer per week    Comment: daily   Drug use: No   Sexual activity: Not Currently  Lifestyle   Physical activity    Days per week: 0 days    Minutes per session: 0 min   Stress: Not at all  Relationships   Social connections    Talks on phone: Once a week    Gets together: Never    Attends religious service: Never    Active member of club or organization: No    Attends meetings of clubs or organizations: Never    Relationship status: Divorced  Other Topics Concern   Not on file  Social History Narrative   Not on file     Family History: The patient's family history includes Diabetes in his  mother; Heart attack in his father; Heart disease in his father and maternal grandmother. ROS:   Please see the history of present illness.    All other systems reviewed and are negative.  EKGs/Labs/Other Studies Reviewed:    The following studies were reviewed today:  Recent Labs: 04/10/2019: ALT 30; BUN 14; Hemoglobin 17.1; Platelets 198; Potassium 4.4; Sodium 139; TSH 2.88 07/09/2019: Creatinine, Ser 0.90  Recent Lipid Panel    Component Value Date/Time   CHOL 165 04/10/2019 0811   TRIG 56 04/10/2019 0811  HDL 48 04/10/2019 0811   CHOLHDL 3.4 04/10/2019 0811   VLDL 13 08/24/2016 1347   LDLCALC 103 (H) 04/10/2019 0811    Physical Exam:    VS:  BP 116/70 (BP Location: Right Arm, Patient Position: Sitting, Cuff Size: Normal)    Pulse 76    Temp (!) 97 F (36.1 C)    Ht 5\' 11"  (1.803 m)    Wt 178 lb 1.9 oz (80.8 kg)    SpO2 95%    BMI 24.84 kg/m     Wt Readings from Last 3 Encounters:  07/16/19 178 lb 1.9 oz (80.8 kg)  07/11/19 179 lb (81.2 kg)  05/31/19 176 lb 12.8 oz (80.2 kg)     GEN:  Well nourished, well developed in no acute distress HEENT: Normal NECK: No JVD; No carotid bruits LYMPHATICS: No lymphadenopathy CARDIAC: RRR, no murmurs, rubs, gallops RESPIRATORY:  Clear to auscultation without rales, wheezing or rhonchi  ABDOMEN: Soft, non-tender, non-distended MUSCULOSKELETAL:  No edema; No deformity  SKIN: Warm and dry NEUROLOGIC:  Alert and oriented x 3 PSYCHIATRIC:  Normal affect    Signed, Shirlee More, MD  07/16/2019 11:47 AM    [place]

## 2019-07-16 ENCOUNTER — Ambulatory Visit (INDEPENDENT_AMBULATORY_CARE_PROVIDER_SITE_OTHER): Payer: PPO | Admitting: Cardiology

## 2019-07-16 ENCOUNTER — Encounter: Payer: Self-pay | Admitting: Cardiology

## 2019-07-16 ENCOUNTER — Other Ambulatory Visit: Payer: Self-pay

## 2019-07-16 VITALS — BP 116/70 | HR 76 | Temp 97.0°F | Ht 71.0 in | Wt 178.1 lb

## 2019-07-16 DIAGNOSIS — I2510 Atherosclerotic heart disease of native coronary artery without angina pectoris: Secondary | ICD-10-CM

## 2019-07-16 DIAGNOSIS — I2584 Coronary atherosclerosis due to calcified coronary lesion: Secondary | ICD-10-CM

## 2019-07-16 DIAGNOSIS — I25118 Atherosclerotic heart disease of native coronary artery with other forms of angina pectoris: Secondary | ICD-10-CM | POA: Diagnosis not present

## 2019-07-16 DIAGNOSIS — I10 Essential (primary) hypertension: Secondary | ICD-10-CM | POA: Diagnosis not present

## 2019-07-16 DIAGNOSIS — E782 Mixed hyperlipidemia: Secondary | ICD-10-CM

## 2019-07-16 NOTE — Patient Instructions (Signed)
Medication Instructions:  Your physician recommends that you continue on your current medications as directed. Please refer to the Current Medication list given to you today.  If you need a refill on your cardiac medications before your next appointment, please call your pharmacy.   Lab work: Your physician recommends that you return for lab work today: lipid panel, lipoprotein A (LPa), CMP.   If you have labs (blood work) drawn today and your tests are completely normal, you will receive your results only by: [name] MyChart Message (if you have MyChart) OR . A paper copy in the mail If you have any lab test that is abnormal or we need to change your treatment, we will call you to review the results.  Testing/Procedures: None  Follow-Up: At [facility], you and your health needs are our priority.  As part of our continuing mission to provide you with exceptional heart care, we have created designated Provider Care Teams.  These Care Teams include your primary Cardiologist (physician) and Advanced Practice Providers (APPs -  Physician Assistants and Nurse Practitioners) who all work together to provide you with the care you need, when you need it. You will need a follow up appointment in 1 years.  Please call our office 2 months in advance to schedule this appointment.

## 2019-07-18 LAB — COMPREHENSIVE METABOLIC PANEL
ALT: 32 IU/L (ref 0–44)
AST: 33 IU/L (ref 0–40)
Albumin/Globulin Ratio: 1.8 (ref 1.2–2.2)
Albumin: 4.6 g/dL (ref 3.8–4.8)
Alkaline Phosphatase: 60 IU/L (ref 39–117)
BUN/Creatinine Ratio: 10 (ref 10–24)
BUN: 10 mg/dL (ref 8–27)
Bilirubin Total: 0.2 mg/dL (ref 0.0–1.2)
CO2: 27 mmol/L (ref 20–29)
Calcium: 9.5 mg/dL (ref 8.6–10.2)
Chloride: 97 mmol/L (ref 96–106)
Creatinine, Ser: 0.96 mg/dL (ref 0.76–1.27)
GFR calc Af Amer: 96 mL/min/{1.73_m2} (ref >59)
GFR calc non Af Amer: 83 mL/min/{1.73_m2} (ref >59)
Globulin, Total: 2.6 g/dL (ref 1.5–4.5)
Glucose: 131 mg/dL — ABNORMAL HIGH (ref 65–99)
Potassium: 4.2 mmol/L (ref 3.5–5.2)
Sodium: 136 mmol/L (ref 134–144)
Total Protein: 7.2 g/dL (ref 6.0–8.5)

## 2019-07-18 LAB — LIPID PANEL
Chol/HDL Ratio: 2.8 ratio (ref 0.0–5.0)
Cholesterol, Total: 120 mg/dL (ref 100–199)
HDL: 43 mg/dL (ref >39)
LDL Chol Calc (NIH): 60 mg/dL (ref 0–99)
Triglycerides: 89 mg/dL (ref 0–149)
VLDL Cholesterol Cal: 17 mg/dL (ref 5–40)

## 2019-07-18 LAB — LIPOPROTEIN A (LPA): Lipoprotein (a): 135.1 nmol/L — ABNORMAL HIGH (ref <75.0)

## 2019-07-26 ENCOUNTER — Telehealth: Payer: Self-pay

## 2019-07-26 NOTE — Telephone Encounter (Signed)
Patient called stating the medication he was given for urinary frequency is not helping. Wanting to know if there is another medication he can try?

## 2019-07-29 ENCOUNTER — Other Ambulatory Visit: Payer: Self-pay | Admitting: Physician Assistant

## 2019-07-29 DIAGNOSIS — J432 Centrilobular emphysema: Secondary | ICD-10-CM

## 2019-07-30 NOTE — Telephone Encounter (Signed)
Patient called back and left vm about referral. He has seen urology recently and should not need referral. He will make appt and call us with any problems/questions.

## 2019-07-30 NOTE — Telephone Encounter (Signed)
He has tried [employer], he has tried flomax, he has tried oxybutynin.confirm he was taking 3 times a day. I would suggest going back to urology and seeing what they suggest. I don't know of anything else to try.

## 2019-08-01 DIAGNOSIS — N401 Enlarged prostate with lower urinary tract symptoms: Secondary | ICD-10-CM | POA: Diagnosis not present

## 2019-08-01 DIAGNOSIS — N3281 Overactive bladder: Secondary | ICD-10-CM | POA: Diagnosis not present

## 2019-08-01 DIAGNOSIS — R3914 Feeling of incomplete bladder emptying: Secondary | ICD-10-CM | POA: Diagnosis not present

## 2019-08-06 ENCOUNTER — Other Ambulatory Visit: Payer: Self-pay | Admitting: Physician Assistant

## 2019-08-06 DIAGNOSIS — F331 Major depressive disorder, recurrent, moderate: Secondary | ICD-10-CM

## 2019-08-06 DIAGNOSIS — F411 Generalized anxiety disorder: Secondary | ICD-10-CM

## 2019-08-27 ENCOUNTER — Other Ambulatory Visit: Payer: Self-pay | Admitting: Cardiology

## 2019-08-27 DIAGNOSIS — I10 Essential (primary) hypertension: Secondary | ICD-10-CM

## 2019-08-27 DIAGNOSIS — I6521 Occlusion and stenosis of right carotid artery: Secondary | ICD-10-CM

## 2019-08-27 DIAGNOSIS — I700 Atherosclerosis of aorta: Secondary | ICD-10-CM

## 2019-08-27 DIAGNOSIS — E782 Mixed hyperlipidemia: Secondary | ICD-10-CM

## 2019-08-27 MED ORDER — ROSUVASTATIN CALCIUM 40 MG PO TABS: 40.0000 mg | ORAL_TABLET | Freq: Every day | ORAL | 2 refills | Status: DC

## 2019-08-27 NOTE — Telephone Encounter (Signed)
°*  STAT* If patient is at the pharmacy, call can be transferred to refill team.   1. Which medications need to be refilled? (please list name of each medication and dose if known) Rosuvastatin 40mg  tablets  2. Which pharmacy/location (including street and city if local pharmacy) is medication to be sent to?[facility] on [address] [place]  3. Do they need a 30 day or 90 day supply? [place]

## 2019-08-27 NOTE — Telephone Encounter (Signed)
Rx for rosuvastatin 40mg  one tablet daily sent to [facility] [address] in [place] as requested.

## 2019-10-01 ENCOUNTER — Telehealth: Payer: Self-pay | Admitting: Neurology

## 2019-10-01 NOTE — Telephone Encounter (Signed)
Patient called to verify what medication he is on for anxiety. Made him aware the Cymbalta, he was reading it and thought it wasn't and wanted to make sure he didn't stop taking anxiety medication. He will call back if needed.

## 2019-10-02 ENCOUNTER — Other Ambulatory Visit: Payer: Self-pay | Admitting: Neurology

## 2019-10-02 DIAGNOSIS — N400 Enlarged prostate without lower urinary tract symptoms: Secondary | ICD-10-CM

## 2019-10-02 MED ORDER — TERAZOSIN HCL 1 MG PO CAPS: 1.0000 mg | ORAL_CAPSULE | Freq: Every day | ORAL | 5 refills | Status: DC

## 2019-10-08 ENCOUNTER — Other Ambulatory Visit: Payer: Self-pay | Admitting: Neurology

## 2019-10-08 ENCOUNTER — Other Ambulatory Visit: Payer: Self-pay | Admitting: Physician Assistant

## 2019-10-08 DIAGNOSIS — F411 Generalized anxiety disorder: Secondary | ICD-10-CM

## 2019-10-08 DIAGNOSIS — N3281 Overactive bladder: Secondary | ICD-10-CM

## 2019-10-08 DIAGNOSIS — F331 Major depressive disorder, recurrent, moderate: Secondary | ICD-10-CM

## 2019-10-08 DIAGNOSIS — F410 Panic disorder [episodic paroxysmal anxiety] without agoraphobia: Secondary | ICD-10-CM

## 2019-10-08 MED ORDER — DULOXETINE HCL 60 MG PO CPEP: 60.0000 mg | ORAL_CAPSULE | Freq: Every day | ORAL | 0 refills | Status: DC

## 2019-10-08 MED ORDER — OXYBUTYNIN CHLORIDE 5 MG PO TABS: 5.0000 mg | ORAL_TABLET | Freq: Three times a day (TID) | ORAL | 2 refills | Status: DC

## 2019-10-08 NOTE — Telephone Encounter (Signed)
Patient called and is in need of a refill of his Duloxetine. It has been sent to pharmacy as patient has been seen recently.

## 2019-11-27 ENCOUNTER — Telehealth: Payer: Self-pay | Admitting: Neurology

## 2019-11-27 DIAGNOSIS — F410 Panic disorder [episodic paroxysmal anxiety] without agoraphobia: Secondary | ICD-10-CM

## 2019-11-27 DIAGNOSIS — F411 Generalized anxiety disorder: Secondary | ICD-10-CM

## 2019-11-27 DIAGNOSIS — F331 Major depressive disorder, recurrent, moderate: Secondary | ICD-10-CM

## 2019-11-27 NOTE — Telephone Encounter (Signed)
Appt made with patient.  

## 2019-11-27 NOTE — Telephone Encounter (Signed)
[place] referral placed. Left message on machine for patient to call back to make a follow up with Jade in the interim.

## 2019-11-27 NOTE — Telephone Encounter (Signed)
Patient called and left vm stating his depression and anxiety is worse. He asked for a referral to a specialist. Should we send referral to behavioral health or bring back in for an appointment first?

## 2019-11-27 NOTE — Telephone Encounter (Signed)
Let him know we can make referral but he should make a virtual with me and see if we can make adjustements until he can get in with [facility].

## 2019-11-28 ENCOUNTER — Ambulatory Visit (INDEPENDENT_AMBULATORY_CARE_PROVIDER_SITE_OTHER): Payer: PPO | Admitting: Physician Assistant

## 2019-11-28 VITALS — Ht 71.0 in | Wt 178.0 lb

## 2019-11-28 DIAGNOSIS — F331 Major depressive disorder, recurrent, moderate: Secondary | ICD-10-CM

## 2019-11-28 DIAGNOSIS — F411 Generalized anxiety disorder: Secondary | ICD-10-CM

## 2019-11-28 DIAGNOSIS — F410 Panic disorder [episodic paroxysmal anxiety] without agoraphobia: Secondary | ICD-10-CM | POA: Diagnosis not present

## 2019-11-28 MED ORDER — DULOXETINE HCL 60 MG PO CPEP: 60.0000 mg | ORAL_CAPSULE | Freq: Two times a day (BID) | ORAL | 1 refills | Status: DC

## 2019-11-28 MED ORDER — ARIPIPRAZOLE 2 MG PO TABS: 2.0000 mg | ORAL_TABLET | Freq: Every day | ORAL | 1 refills | Status: DC

## 2019-11-28 NOTE — Progress Notes (Signed)
PHQ9 (17) -GAD7 (3) completed.   He will give blood pressure reading to Elpidio Thielen at visit.

## 2019-11-28 NOTE — Progress Notes (Signed)
Patient ID: Edward Cuevas, male   DOB: 14-Nov-1955, 64 y.o.   MRN: [id number] .Marland KitchenVirtual Visit via Telephone Note  I connected with Edward Cuevas on 11/28/2019 at 11:30 AM EST by telephone and verified that I am speaking with the correct person using two identifiers.  Location: Patient: home Provider: clinic   I discussed the limitations, risks, security and privacy concerns of performing an evaluation and management service by telephone and the availability of in person appointments. I also discussed with the patient that there may be a patient responsible charge related to this service. The patient expressed understanding and agreed to proceed.   History of Present Illness: Pt is a 64 yo male with HTN, COPD, MDD, GAD who calls into the clinic with worsening anxiety and depression. He request a referral to [facility]. He has been on cymbalta for 3 months with just a little improvement. He feels down and not motivated. He does not do a lot but is taking care of a friend dying of cancer. This is very hard on him. No SI/HC. COVID has kept him inside a lot.   .. Active Ambulatory Problems    Diagnosis Date Noted  . Mixed hyperlipidemia   . Hypertension   . Prediabetes   . Vitamin D deficiency   . DJD 10/23/2013  . DDD (degenerative disc disease), lumbosacral 10/23/2013  . Panic attacks 10/04/2014  . History of hepatitis C 10/08/2014  . Right inguinal hernia 10/08/2014  . GAD (generalized anxiety disorder) 11/01/2014  . Actinic keratosis 06/27/2015  . BPH without obstruction/lower urinary tract symptoms 11/25/2015  . Chronic pain of multiple joints 11/25/2015  . Fatty liver 10/01/2016  . Chronic obstructive pulmonary disease ([place]) 12/29/2016  . Lumbar radiculopathy 01/18/2017  . Right carotid bruit 04/09/2019  . Right lower quadrant abdominal pain 04/09/2019  . Current smoker 04/09/2019  . Abdominal distension (gaseous) 04/11/2019  . Adrenal adenoma 04/17/2019  . Carotid stenosis, right 04/17/2019   . Centrilobular emphysema ([place]) 05/16/2019  . Moderate episode of recurrent major depressive disorder ([place]) 05/16/2019  . Aortic atherosclerosis ([place]) 05/16/2019  . Coronary artery disease due to calcified coronary lesion 05/16/2019  . Pulmonary nodules 05/16/2019  . Dyspnea on exertion 05/31/2019  . OAB (overactive bladder) 07/12/2019   Resolved Ambulatory Problems    Diagnosis Date Noted  . Encounter for long-term (current) use of other medications 04/08/2014  . Olecranon bursitis 01/31/2015  . Urinary frequency 12/29/2016  . Routine physical examination 12/29/2016  . Right ankle pain 02/21/2017   Past Medical History:  Diagnosis Date  . Anxiety   . Arthritis   . Cancer ([place])   . COPD (chronic obstructive pulmonary disease) ([place])   . Depression   . Hepatitis   . Hyperlipidemia    Reviewed med, allergies, problem list.    Observations/Objective: No acute distress. Normal mood.   .. Today's Vitals   11/28/19 1009  Weight: 178 lb (80.7 kg)  Height: 5\' 11"  (1.803 m)   Body mass index is 24.83 kg/m.   [name] Depression screen [facility] 2/9 11/28/2019 07/11/2019 05/15/2019 04/09/2019 12/15/2018  Decreased Interest 3 1 2 3  0  Down, Depressed, Hopeless 3 1 1  0 0  PHQ - 2 Score 6 2 3 3  0  Altered sleeping 3 0 1 3 -  Tired, decreased energy 3 2 2 2  -  Change in appetite 0 1 0 0 -  Feeling bad or failure about yourself  3 0 1 0 -  Trouble concentrating 2 0 0  0 -  Moving slowly or fidgety/restless 0 0 0 0 -  Suicidal thoughts 0 0 0 0 -  PHQ-9 Score 17 5 7 8  -  Difficult doing work/chores Very difficult Somewhat difficult Somewhat difficult Somewhat difficult -   .. GAD 7 : Generalized Anxiety Score 11/28/2019 07/11/2019 05/16/2019 04/09/2019  Nervous, Anxious, on Edge 0 0 0 0  Control/stop worrying 0 0 0 0  Worry too much - different things 0 0 0 0  Trouble relaxing 0 0 0 0  Restless 0 0 0 0  Easily annoyed or irritable 3 0 1 0  Afraid - awful might happen 0 0 0 0  Total GAD 7  Score 3 0 1 0  Anxiety Difficulty Not difficult at all Not difficult at all Not difficult at all Not difficult at all     Assessment and Plan: .Marland KitchenJohn was seen today for depression and anxiety.  Diagnoses and all orders for this visit:  Moderate episode of recurrent major depressive disorder (HCC) -     DULoxetine (CYMBALTA) 60 MG capsule; Take 1 capsule (60 mg total) by mouth 2 (two) times daily. -     ARIPiprazole (ABILIFY) 2 MG tablet; Take 1 tablet (2 mg total) by mouth daily.  GAD (generalized anxiety disorder) -     DULoxetine (CYMBALTA) 60 MG capsule; Take 1 capsule (60 mg total) by mouth 2 (two) times daily.  Panic attacks -     DULoxetine (CYMBALTA) 60 MG capsule; Take 1 capsule (60 mg total) by mouth 2 (two) times daily.   Increased cymbalta to 120mg . I think it might be helping him more than he thinks. Added abilify small dose. Referral made to [facility]. Counseling could also help him. Discussed getting out and walking every day for at least 15 minutes. Follow up as needed or in 3 months.    Follow Up Instructions:    I discussed the assessment and treatment plan with the patient. The patient was provided an opportunity to ask questions and all were answered. The patient agreed with the plan and demonstrated an understanding of the instructions.   The patient was advised to call back or seek an in-person evaluation if the symptoms worsen or if the condition fails to improve as anticipated.  I provided 22 minutes of non-face-to-face time during this encounter.   Iran Planas, PA-C

## 2019-11-30 ENCOUNTER — Encounter: Payer: Self-pay | Admitting: Physician Assistant

## 2019-12-28 ENCOUNTER — Other Ambulatory Visit: Payer: Self-pay | Admitting: Family Medicine

## 2019-12-28 DIAGNOSIS — M5416 Radiculopathy, lumbar region: Secondary | ICD-10-CM

## 2020-01-08 ENCOUNTER — Ambulatory Visit: Payer: PPO | Admitting: Physician Assistant

## 2020-01-09 ENCOUNTER — Other Ambulatory Visit: Payer: Self-pay

## 2020-01-09 ENCOUNTER — Ambulatory Visit (INDEPENDENT_AMBULATORY_CARE_PROVIDER_SITE_OTHER): Payer: PPO | Admitting: Physician Assistant

## 2020-01-09 ENCOUNTER — Encounter: Payer: Self-pay | Admitting: Physician Assistant

## 2020-01-09 VITALS — BP 114/67 | HR 84 | Ht 71.0 in | Wt 181.0 lb

## 2020-01-09 DIAGNOSIS — F410 Panic disorder [episodic paroxysmal anxiety] without agoraphobia: Secondary | ICD-10-CM

## 2020-01-09 DIAGNOSIS — F331 Major depressive disorder, recurrent, moderate: Secondary | ICD-10-CM

## 2020-01-09 DIAGNOSIS — J432 Centrilobular emphysema: Secondary | ICD-10-CM

## 2020-01-09 DIAGNOSIS — I700 Atherosclerosis of aorta: Secondary | ICD-10-CM | POA: Diagnosis not present

## 2020-01-09 DIAGNOSIS — N400 Enlarged prostate without lower urinary tract symptoms: Secondary | ICD-10-CM

## 2020-01-09 DIAGNOSIS — F411 Generalized anxiety disorder: Secondary | ICD-10-CM | POA: Diagnosis not present

## 2020-01-09 DIAGNOSIS — I10 Essential (primary) hypertension: Secondary | ICD-10-CM | POA: Diagnosis not present

## 2020-01-09 MED ORDER — DULOXETINE HCL 60 MG PO CPEP: 60.0000 mg | ORAL_CAPSULE | Freq: Two times a day (BID) | ORAL | 5 refills | Status: DC

## 2020-01-09 MED ORDER — FLUTICASONE-UMECLIDIN-VILANT 200-62.5-25 MCG/INH IN AEPB: 1.0000 | INHALATION_SPRAY | Freq: Every day | RESPIRATORY_TRACT | 5 refills | Status: DC

## 2020-01-09 NOTE — Progress Notes (Signed)
Subjective:    Patient ID: Edward Cuevas, male    DOB: [birth year], 64 y.o.   MRN: [id number]  HPI  Patient is a 64 year old male with hypertension, aortic atherosclerosis, COPD, GERD, GAD, MDD, and panic attacks who presents to the clinic for 62-month follow-up and medication management.  COPD-per patient he feels good.  He will not use Trelegy daily.  He feels like he only needs it every other day.  He feels like for the most part his breathing is good.  He is doing most things that he would like to do.  He has not ongoing chronic productive cough.  He denies any significant shortness of breath or peripheral edema.  He continues to smoke daily.  Patient does see cardiology for his aortic atherosclerosis and hypertension.  He has had a recent complete cardiac work-up with some known cardiovascular disease.  He is on a statin.  His anxiety and depression is still not controlled.  He did make a psychiatrist appointment in the near future.  We have tried him on Abilify and he did not like the way it made him feel.  He continues Cymbalta which she feels like does help him some.  He feels like he has issues with irritability and anger. Tried celexa, prozac in the past no benefit with that.   BPH-managed by urology. On flomax and myrbetriq.   [name] Active Ambulatory Problems    Diagnosis Date Noted  . Mixed hyperlipidemia   . Hypertension   . Prediabetes   . Vitamin D deficiency   . DJD 10/23/2013  . DDD (degenerative disc disease), lumbosacral 10/23/2013  . Panic attacks 10/04/2014  . History of hepatitis C 10/08/2014  . Right inguinal hernia 10/08/2014  . GAD (generalized anxiety disorder) 11/01/2014  . Actinic keratosis 06/27/2015  . BPH without obstruction/lower urinary tract symptoms 11/25/2015  . Chronic pain of multiple joints 11/25/2015  . Fatty liver 10/01/2016  . Chronic obstructive pulmonary disease ([place]) 12/29/2016  . Lumbar radiculopathy 01/18/2017  . Right carotid bruit 04/09/2019   . Right lower quadrant abdominal pain 04/09/2019  . Current smoker 04/09/2019  . Abdominal distension (gaseous) 04/11/2019  . Adrenal adenoma 04/17/2019  . Carotid stenosis, right 04/17/2019  . Centrilobular emphysema ([place]) 05/16/2019  . Moderate episode of recurrent major depressive disorder ([place]) 05/16/2019  . Aortic atherosclerosis ([place]) 05/16/2019  . Coronary artery disease due to calcified coronary lesion 05/16/2019  . Pulmonary nodules 05/16/2019  . Dyspnea on exertion 05/31/2019  . OAB (overactive bladder) 07/12/2019   Resolved Ambulatory Problems    Diagnosis Date Noted  . Encounter for long-term (current) use of other medications 04/08/2014  . Olecranon bursitis 01/31/2015  . Urinary frequency 12/29/2016  . Routine physical examination 12/29/2016  . Right ankle pain 02/21/2017   Past Medical History:  Diagnosis Date  . Anxiety   . Arthritis   . Cancer ([place])   . COPD (chronic obstructive pulmonary disease) ([place])   . Depression   . Hepatitis   . Hyperlipidemia      Review of Systems See HPI.     Objective:   Physical Exam Vitals reviewed.  Constitutional:      Appearance: Normal appearance.  Cardiovascular:     Rate and Rhythm: Normal rate and regular rhythm.     Pulses: Normal pulses.  Pulmonary:     Effort: Pulmonary effort is normal.     Comments: Coarse breath sounds.  Neurological:     General: No focal deficit present.  Mental Status: He is alert and oriented to person, place, and time.  Psychiatric:        Mood and Affect: Mood normal.    .. Depression screen [facility] 2/9 01/09/2020 11/28/2019 07/11/2019 05/15/2019 04/09/2019  Decreased Interest 2 3 1 2 3   Down, Depressed, Hopeless 2 3 1 1  0  PHQ - 2 Score 4 6 2 3 3   Altered sleeping 0 3 0 1 3  Tired, decreased energy 2 3 2 2 2   Change in appetite 0 0 1 0 0  Feeling bad or failure about yourself  2 3 0 1 0  Trouble concentrating 2 2 0 0 0  Moving slowly or fidgety/restless 0 0 0 0 0  Suicidal  thoughts 0 0 0 0 0  PHQ-9 Score 10 17 5 7 8   Difficult doing work/chores - Very difficult Somewhat difficult Somewhat difficult Somewhat difficult   .[name] GAD 7 : Generalized Anxiety Score 01/09/2020 11/28/2019 07/11/2019 05/16/2019  Nervous, Anxious, on Edge 1 0 0 0  Control/stop worrying 1 0 0 0  Worry too much - different things 1 0 0 0  Trouble relaxing 0 0 0 0  Restless 0 0 0 0  Easily annoyed or irritable 2 3 0 1  Afraid - awful might happen 1 0 0 0  Total GAD 7 Score 6 3 0 1  Anxiety Difficulty Somewhat difficult Not difficult at all Not difficult at all Not difficult at all           Assessment & Plan:  .Marland KitchenJohn was seen today for follow-up.  Diagnoses and all orders for this visit:  Moderate episode of recurrent major depressive disorder (HCC) -     DULoxetine (CYMBALTA) 60 MG capsule; Take 1 capsule (60 mg total) by mouth 2 (two) times daily.  GAD (generalized anxiety disorder) -     DULoxetine (CYMBALTA) 60 MG capsule; Take 1 capsule (60 mg total) by mouth 2 (two) times daily.  Panic attacks -     DULoxetine (CYMBALTA) 60 MG capsule; Take 1 capsule (60 mg total) by mouth 2 (two) times daily.  Centrilobular emphysema ([place]) -     Fluticasone-Umeclidin-Vilant 200-62.5-25 MCG/INH AEPB; Inhale 1 puff into the lungs daily.  Essential hypertension  Aortic atherosclerosis (HCC)  BPH without obstruction/lower urinary tract symptoms   Continue to follow-up with cardiology as needed stay on statin.  Blood pressure controlled into goal.  Discussed importance of trilogy.  Discussed how best to use and best results at taking daily.  Patient refused smoking cessation counseling. He has had yearly CT low dose screening. CT of abdomen negative for AAA.   Continue follow-up with urology for BPH symptoms and medication management.  Continue Cymbalta for now for depression, anxiety, panic attacks.  He did not tolerate Abilify.  Keep psychiatric appointment.  I do think Cymbalta  helped significantly with his pain.  He is not really reported a lot of pain since starting.  I am worried that it could be making his anxiety a little worse.   Follow up in 6 months.

## 2020-01-11 ENCOUNTER — Encounter: Payer: Self-pay | Admitting: Physician Assistant

## 2020-02-12 ENCOUNTER — Other Ambulatory Visit: Payer: Self-pay

## 2020-02-12 ENCOUNTER — Ambulatory Visit (INDEPENDENT_AMBULATORY_CARE_PROVIDER_SITE_OTHER): Payer: PPO | Admitting: Adult Health

## 2020-02-12 VITALS — BP 138/70 | HR 92 | Ht 70.0 in | Wt 178.0 lb

## 2020-02-12 DIAGNOSIS — F331 Major depressive disorder, recurrent, moderate: Secondary | ICD-10-CM

## 2020-02-12 DIAGNOSIS — G4700 Insomnia, unspecified: Secondary | ICD-10-CM | POA: Diagnosis not present

## 2020-02-12 MED ORDER — BUPROPION HCL ER (XL) 150 MG PO TB24: ORAL_TABLET | ORAL | 2 refills | Status: DC

## 2020-02-12 NOTE — Progress Notes (Signed)
[facility] MD/PA/NP Initial Note  02/12/2020 2:48 PM Edward Cuevas  MRN:  [id number]  Chief Complaint:   HPI:   Describes mood today as "not the best". Pleasant. Mood symptoms - reports depression and irritability. Denies anxiety. Symptoms started in 2014 after an accident. Stating "I was active all my life, now I can't do much of anything". Having cognitive issues - forgetful. Having to write things down or he will forget them. Stating "I want to feel like doing something again". Is currently taking Cymbalta 120mg  daily. Stating I don't think it has helped me". Feels like sleep is a problem. Going to bed at 11 and it takes hours to get to sleep. Usually falls asleep around 3 am and is up at 8:30 to take medications and then lays there for an hour. Taking care of a friend in the afternoons. Decreased interest and motivation - "I have none". Taking medications as prescribed.  Energy levels low - "none". Active, does not have a regular exercise routine with physical disabilities. Receiving disability benefits/retirement.   Enjoys some usual interests and activities. Divorced. Lives with ex wife. Has a 73 year old son locally and another son in [place]. Has a sister in [place]. Spending time with family. Appetite adequate. Weight stable - 180 pounds. Sleeps well most nights. Averages 5.5 hours on a "good" night. Getting up and down to the bathroom. Denies daytime napping.  Focus and concentration difficulties since 2014 since taking "hepatitis" treatment.  Can do things, doesn't have the will. Completing tasks. Managing aspects of household. 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 Denies SI or HI. Denies AH or VH.  Previous medication trials: Cymbalta  Visit Diagnosis: No diagnosis found.  Past Psychiatric History: Denies psychiatric hospitalization.  Past Medical History:  Past Medical History:  Diagnosis Date  . Anxiety   . Arthritis    . Cancer ([place])    skin  . COPD (chronic obstructive pulmonary disease) ([place])    per pt ?  [name] Depression   . Hepatitis    A,B,C  . Hyperlipidemia   . Hypertension   . Prediabetes   . Vitamin D deficiency     Past Surgical History:  Procedure Laterality Date  . ELBOW SURGERY Right 1986  . [place]  . HERNIA REPAIR Left 1996  . INGUINAL HERNIA REPAIR Right 12/30/2015   Procedure: RIGHT RIGHT INGUINAL HERNIA REPAIR;  Surgeon: Ralene Ok, MD;  Location: [facility];  Service: General;  Laterality: Right;  . INSERTION OF MESH Right 12/30/2015   Procedure: INSERTION OF MESH;  Surgeon: Ralene Ok, MD;  Location: [facility];  Service: General;  Laterality: Right;  . SHOULDER SURGERY Left 2007    Family Psychiatric History: Denies any family history of mental illness.  Family History:  Family History  Problem Relation Age of Onset  . Diabetes Mother   . Heart disease Father   . Heart attack Father   . Heart disease Maternal Grandmother     Social History:  Social History   Socioeconomic History  . Marital status: Divorced    Spouse name: Not on file  . Number of children: 2  . Years of education: 48  . Highest education level: 11th grade  Occupational History  . Occupation: superintendant    Comment: retired  Tobacco Use  . Smoking status: Current Every Day Smoker    Packs/day: 1.00    Years: 30.00  Pack years: 30.00    Types: Cigarettes  . Smokeless tobacco: Never Used  Substance and Sexual Activity  . Alcohol use: Yes    Alcohol/week: 3.0 standard drinks    Types: 3 Cans of beer per week    Comment: daily  . Drug use: No  . Sexual activity: Not Currently  Other Topics Concern  . Not on file  Social History Narrative  . Not on file   Social Determinants of Health   Financial Resource Strain: Low Risk   . Difficulty of Paying Living Expenses: Not hard at all  Food Insecurity: No Food Insecurity  . Worried About Charity fundraiser  in the Last Year: Never true  . Ran Out of Food in the Last Year: Never true  Transportation Needs: No Transportation Needs  . Lack of Transportation (Medical): No  . Lack of Transportation (Non-Medical): No  Physical Activity: Inactive  . Days of Exercise per Week: 0 days  . Minutes of Exercise per Session: 0 min  Stress: No Stress Concern Present  . Feeling of Stress : Not at all  Social Connections: Severely Isolated  . Frequency of Communication with Friends and Family: Once a week  . Frequency of Social Gatherings with Friends and Family: Never  . Attends Religious Services: Never  . Active Member of Clubs or Organizations: No  . Attends Archivist Meetings: Never  . Marital Status: Divorced    Allergies:  Allergies  Allergen Reactions  . Abilify [Aripiprazole]     Feel weird.   . Penicillins Itching and Rash    Has patient had a PCN reaction causing immediate rash, facial/tongue/throat swelling, SOB or lightheadedness with hypotension: Yes Has patient had a PCN reaction causing severe rash involving mucus membranes or skin necrosis: No Has patient had a PCN reaction that required hospitalization No Has patient had a PCN reaction occurring within the last 10 years: No If all of the above answers are "NO", then may proceed with Cephalosporin use.     Metabolic Disorder Labs: Lab Results  Component Value Date   HGBA1C 5.5 04/10/2019   MPG 120 (H) 09/19/2014   MPG 117 (H) 04/08/2014   No results found for: PROLACTIN Lab Results  Component Value Date   CHOL 120 07/16/2019   TRIG 89 07/16/2019   HDL 43 07/16/2019   CHOLHDL 2.8 07/16/2019   VLDL 13 08/24/2016   LDLCALC 60 07/16/2019   LDLCALC 103 (H) 04/10/2019   Lab Results  Component Value Date   TSH 2.88 04/10/2019   TSH 1.537 09/19/2014    Therapeutic Level Labs: No results found for: LITHIUM No results found for: VALPROATE No components found for:  CBMZ  Current Medications: Current  Outpatient Medications  Medication Sig Dispense Refill  . acetaminophen (TYLENOL 8 HOUR ARTHRITIS PAIN) 650 MG CR tablet Take 650 mg by mouth 2 (two) times daily.    [name] albuterol (VENTOLIN HFA) 108 (90 Base) MCG/ACT inhaler INHALE 1 TO 2 PUFFS INTO THE LUNGS EVERY 6 HOURS AS NEEDED FOR WHEEZING OR SHORTNESS OF BREATH 8.5 g 5  . aspirin EC 81 MG tablet Take 1 tablet (81 mg total) by mouth daily. 90 tablet 3  . DULoxetine (CYMBALTA) 60 MG capsule Take 1 capsule (60 mg total) by mouth 2 (two) times daily. 60 capsule 5  . Fluticasone-Umeclidin-Vilant 200-62.5-25 MCG/INH AEPB Inhale 1 puff into the lungs daily. 60 each 5  . gabapentin (NEURONTIN) 300 MG capsule TAKE 2 CAPSULES BY MOUTH  TWICE DAILY 360 capsule 3  . linaclotide (LINZESS) 290 MCG CAPS capsule Take 1 capsule (290 mcg total) by mouth daily. 30 capsule 3  . MYRBETRIQ 50 MG TB24 tablet Take 50 mg by mouth daily.    . nitroGLYCERIN (NITROSTAT) 0.4 MG SL tablet Place 1 tablet (0.4 mg total) under the tongue every 5 (five) minutes as needed for chest pain. (Patient not taking: Reported on 01/09/2020) 25 tablet 3  . rosuvastatin (CRESTOR) 40 MG tablet Take 1 tablet (40 mg total) by mouth daily. 90 tablet 2  . tamsulosin (FLOMAX) 0.4 MG CAPS capsule Take 0.4 mg by mouth at bedtime.     No current facility-administered medications for this visit.    Medication Side Effects: none  Orders placed this visit:  No orders of the defined types were placed in this encounter.   Psychiatric Specialty Exam:  Review of Systems  There were no vitals taken for this visit.There is no height or weight on file to calculate BMI.  General Appearance: Neat and Well Groomed  Eye Contact:  Good  Speech:  Clear and Coherent and Normal Rate  Volume:  Normal  Mood:  Depressed and Irritable  Affect:  Congruent  Thought Process:  Coherent and Descriptions of Associations: Intact  Orientation:  Full (Time, Place, and Person)  Thought Content: Logical   Suicidal  Thoughts:  No  Homicidal Thoughts:  No  Memory:  WNL  Judgement:  Good  Insight:  Good  Psychomotor Activity:  Normal  Concentration:  Concentration: Good  Recall:  Good  Fund of Knowledge: Good  Language: Good  Assets:  Communication Skills Desire for Improvement Financial Resources/Insurance Housing Intimacy Leisure Time Physical Health Resilience Social Support Talents/Skills Transportation Vocational/Educational  ADL's:  Intact  Cognition: WNL  Prognosis:  Good   Screenings:  GAD-7     Office Visit from 01/09/2020 in [facility] Office Visit from 11/28/2019 in [facility] Visit from 07/11/2019 in [facility] Visit from 05/16/2019 in [facility] Visit from 04/09/2019 in [facility]  Total GAD-7 Score  6  3  0  1  0    PHQ2-9     Office Visit from 01/09/2020 in [facility] from 11/28/2019 in [facility] from 07/11/2019 in [facility] from 05/15/2019 in [facility] from 04/09/2019 in [facility]  PHQ-2 Total Score  4  6  2  3  3   PHQ-9 Total Score  10  17  5  7  8       Receiving Psychotherapy: No   Treatment Plan/Recommendations:  Plan:  PDMP reviewed  1. Continue Cymbalta 120mg  daily  2. Add Wellbutrin XL 150mg  daily x 7 days, then 300mg  daily - Denies seizure   Read and reviewed note with patient for accuracy.   RTC 4 weeks  Patient advised to contact office with any questions, adverse effects, or acute worsening in signs and symptoms.      Aloha Gell, NP

## 2020-02-15 ENCOUNTER — Telehealth: Payer: Self-pay | Admitting: Adult Health

## 2020-02-15 NOTE — Telephone Encounter (Signed)
Left detailed message on personal cell phone with information and to call back next week.

## 2020-02-15 NOTE — Telephone Encounter (Signed)
Stop Wellbutrin.  SE will resolve in a couple of days.  Will get better quickly

## 2020-02-15 NOTE — Telephone Encounter (Signed)
Pt has taken the Bupropion for three days now. Feels like he's coming out of his skin, trouble sleeping, eyes are unfocused, feels really wired. Can he try another option?

## 2020-02-18 NOTE — Telephone Encounter (Signed)
Noted. Will wait until he returns call with update before prescribing another medication.

## 2020-02-18 NOTE — Telephone Encounter (Signed)
Pt called back asking that he try the other medication that was discussed. Pt can't take the med that he tried last week.

## 2020-02-19 ENCOUNTER — Other Ambulatory Visit: Payer: Self-pay | Admitting: Adult Health

## 2020-02-19 DIAGNOSIS — G4700 Insomnia, unspecified: Secondary | ICD-10-CM

## 2020-02-19 DIAGNOSIS — F331 Major depressive disorder, recurrent, moderate: Secondary | ICD-10-CM

## 2020-02-19 MED ORDER — RISPERIDONE 1 MG PO TABS: ORAL_TABLET | ORAL | 2 refills | Status: DC

## 2020-02-19 NOTE — Telephone Encounter (Signed)
I sent in Risperdal to help with sleep and the depression. The Norepinephrine seems to be too activating.

## 2020-02-19 NOTE — Telephone Encounter (Signed)
Left patient detailed message with information and to call back with any concerns or questions.

## 2020-03-09 ENCOUNTER — Other Ambulatory Visit: Payer: Self-pay | Admitting: Physician Assistant

## 2020-03-09 DIAGNOSIS — N400 Enlarged prostate without lower urinary tract symptoms: Secondary | ICD-10-CM

## 2020-03-11 ENCOUNTER — Telehealth (INDEPENDENT_AMBULATORY_CARE_PROVIDER_SITE_OTHER): Payer: PPO | Admitting: Adult Health

## 2020-03-11 DIAGNOSIS — F331 Major depressive disorder, recurrent, moderate: Secondary | ICD-10-CM

## 2020-03-11 DIAGNOSIS — G4700 Insomnia, unspecified: Secondary | ICD-10-CM | POA: Diagnosis not present

## 2020-03-11 MED ORDER — RISPERIDONE 1 MG PO TABS: ORAL_TABLET | ORAL | 1 refills | Status: DC

## 2020-03-11 MED ORDER — DULOXETINE HCL 60 MG PO CPEP: 60.0000 mg | ORAL_CAPSULE | Freq: Two times a day (BID) | ORAL | 1 refills | Status: DC

## 2020-03-11 NOTE — Progress Notes (Signed)
Edward Cuevas [id number] November 27, 1955 64 y.o.  Virtual Visit via Telephone Note  I connected with pt on 03/11/20 at  2:40 PM EDT by telephone and verified that I am speaking with the correct person using two identifiers.   I discussed the limitations, risks, security and privacy concerns of performing an evaluation and management service by telephone and the availability of in person appointments. I also discussed with the patient that there may be a patient responsible charge related to this service. The patient expressed understanding and agreed to proceed.   I discussed the assessment and treatment plan with the patient. The patient was provided an opportunity to ask questions and all were answered. The patient agreed with the plan and demonstrated an understanding of the instructions.   The patient was advised to call back or seek an in-person evaluation if the symptoms worsen or if the condition fails to improve as anticipated.  I provided 30 minutes of non-face-to-face time during this encounter.  The patient was located at home.  The provider was located at [facility].   Aloha Gell, NP   Subjective:   Patient ID:  Edward Cuevas is a 64 y.o. (DOB 1956/06/29) male.  Chief Complaint: No chief complaint on file.   HPI Shu Fazzino presents for follow-up of MDD and insomnia.   Describes mood today as "good". Pleasant. Mood symptoms - denies depression, anxiety, and irritability. Stating "I'm doing much better". No longer has a sour attitude - "back to friendly old self". Back to doing crafts again - "rock art". Making bracelets and necklaces. Improved interest and motivation. Taking medications as prescribed.  Energy levels improved. Active, does not have a regular exercise routine with physical disabilities. Receiving disability benefits/retirement.   Enjoys some usual interests and activities. Divorced. Lives with ex wife. Has a 41 year old son locally and another son in  [place]. Has a sister in [place]. Spending time with family. Appetite adequate. Weight stable - 180 pounds. Sleeps well most nights. Averages 6 to 7 hours. Focus and concentration improved. Completing tasks. Managing aspects of household. 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 Denies SI or HI. Denies AH or VH.  Previous medication trials: Cymbalta, Wellbutrin  Review of Systems:  Review of Systems  Musculoskeletal: Negative for gait problem.  Neurological: Negative for tremors.  Psychiatric/Behavioral:       Please refer to HPI    Medications: I have reviewed the patient's current medications.  Current Outpatient Medications  Medication Sig Dispense Refill  . acetaminophen (TYLENOL 8 HOUR ARTHRITIS PAIN) 650 MG CR tablet Take 650 mg by mouth 2 (two) times daily.    [name] albuterol (VENTOLIN HFA) 108 (90 Base) MCG/ACT inhaler INHALE 1 TO 2 PUFFS INTO THE LUNGS EVERY 6 HOURS AS NEEDED FOR WHEEZING OR SHORTNESS OF BREATH 8.5 g 5  . aspirin EC 81 MG tablet Take 1 tablet (81 mg total) by mouth daily. 90 tablet 3  . buPROPion (WELLBUTRIN XL) 150 MG 24 hr tablet Take one tablet every morning x 7 days, then take two tablets every morning. 60 tablet 2  . DULoxetine (CYMBALTA) 60 MG capsule Take 1 capsule (60 mg total) by mouth 2 (two) times daily. 60 capsule 5  . Fluticasone-Umeclidin-Vilant 200-62.5-25 MCG/INH AEPB Inhale 1 puff into the lungs daily. 60 each 5  . gabapentin (NEURONTIN) 300 MG capsule TAKE 2 CAPSULES BY MOUTH TWICE DAILY 360 capsule 3  . linaclotide (LINZESS) 290 MCG CAPS capsule Take 1 capsule (290 mcg  total) by mouth daily. 30 capsule 3  . MYRBETRIQ 50 MG TB24 tablet Take 50 mg by mouth daily.    . nitroGLYCERIN (NITROSTAT) 0.4 MG SL tablet Place 1 tablet (0.4 mg total) under the tongue every 5 (five) minutes as needed for chest pain. (Patient not taking: Reported on 01/09/2020) 25 tablet 3  . risperiDONE (RISPERDAL) 1 MG tablet Take 1/2 tablet at bedtime for 7  days, then increase to one tablet at bedtime. 30 tablet 2  . rosuvastatin (CRESTOR) 40 MG tablet Take 1 tablet (40 mg total) by mouth daily. 90 tablet 2  . tamsulosin (FLOMAX) 0.4 MG CAPS capsule Take 0.4 mg by mouth at bedtime.     No current facility-administered medications for this visit.    Medication Side Effects: None  Allergies:  Allergies  Allergen Reactions  . Abilify [Aripiprazole]     Feel weird.   . Penicillins Itching and Rash    Has patient had a PCN reaction causing immediate rash, facial/tongue/throat swelling, SOB or lightheadedness with hypotension: Yes Has patient had a PCN reaction causing severe rash involving mucus membranes or skin necrosis: No Has patient had a PCN reaction that required hospitalization No Has patient had a PCN reaction occurring within the last 10 years: No If all of the above answers are "NO", then may proceed with Cephalosporin use.     Past Medical History:  Diagnosis Date  . Anxiety   . Arthritis   . Cancer ([place])    skin  . COPD (chronic obstructive pulmonary disease) ([place])    per pt ?  [name] Depression   . Hepatitis    A,B,C  . Hyperlipidemia   . Hypertension   . Prediabetes   . Vitamin D deficiency     Family History  Problem Relation Age of Onset  . Diabetes Mother   . Heart disease Father   . Heart attack Father   . Heart disease Maternal Grandmother     Social History   Socioeconomic History  . Marital status: Divorced    Spouse name: Not on file  . Number of children: 2  . Years of education: 34  . Highest education level: 11th grade  Occupational History  . Occupation: superintendant    Comment: retired  Tobacco Use  . Smoking status: Current Every Day Smoker    Packs/day: 1.00    Years: 30.00    Pack years: 30.00    Types: Cigarettes  . Smokeless tobacco: Never Used  Substance and Sexual Activity  . Alcohol use: Yes    Alcohol/week: 3.0 standard drinks    Types: 3 Cans of beer per week    Comment:  daily  . Drug use: No  . Sexual activity: Not Currently  Other Topics Concern  . Not on file  Social History Narrative  . Not on file   Social Determinants of Health   Financial Resource Strain: Low Risk   . Difficulty of Paying Living Expenses: Not hard at all  Food Insecurity: No Food Insecurity  . Worried About Charity fundraiser in the Last Year: Never true  . Ran Out of Food in the Last Year: Never true  Transportation Needs: No Transportation Needs  . Lack of Transportation (Medical): No  . Lack of Transportation (Non-Medical): No  Physical Activity: Inactive  . Days of Exercise per Week: 0 days  . Minutes of Exercise per Session: 0 min  Stress: No Stress Concern Present  . Feeling of Stress :  Not at all  Social Connections: Severely Isolated  . Frequency of Communication with Friends and Family: Once a week  . Frequency of Social Gatherings with Friends and Family: Never  . Attends Religious Services: Never  . Active Member of Clubs or Organizations: No  . Attends Archivist Meetings: Never  . Marital Status: Divorced  Human resources officer Violence: Not At Risk  . Fear of Current or Ex-Partner: No  . Emotionally Abused: No  . Physically Abused: No  . Sexually Abused: No    Past Medical History, Surgical history, Social history, and Family history were reviewed and updated as appropriate.   Please see review of systems for further details on the patient's review from today.   Objective:   Physical Exam:  There were no vitals taken for this visit.  Physical Exam Neurological:     Mental Status: He is alert and oriented to person, place, and time.     Cranial Nerves: No dysarthria.  Psychiatric:        Attention and Perception: Attention and perception normal.        Mood and Affect: Mood normal.        Speech: Speech normal.        Behavior: Behavior is cooperative.        Thought Content: Thought content normal. Thought content is not paranoid or  delusional. Thought content does not include homicidal or suicidal ideation. Thought content does not include homicidal or suicidal plan.        Cognition and Memory: Cognition and memory normal.        Judgment: Judgment normal.     Comments: Insight intact     Lab Review:     Component Value Date/Time   NA 136 07/16/2019 1151   K 4.2 07/16/2019 1151   CL 97 07/16/2019 1151   CO2 27 07/16/2019 1151   GLUCOSE 131 (H) 07/16/2019 1151   GLUCOSE 111 (H) 04/10/2019 0811   BUN 10 07/16/2019 1151   CREATININE 0.96 07/16/2019 1151   CREATININE 1.03 04/10/2019 0811   CALCIUM 9.5 07/16/2019 1151   PROT 7.2 07/16/2019 1151   ALBUMIN 4.6 07/16/2019 1151   AST 33 07/16/2019 1151   ALT 32 07/16/2019 1151   ALKPHOS 60 07/16/2019 1151   BILITOT 0.2 07/16/2019 1151   GFRNONAA 83 07/16/2019 1151   GFRNONAA 76 04/10/2019 0811   GFRAA 96 07/16/2019 1151   GFRAA 89 04/10/2019 0811       Component Value Date/Time   WBC 5.8 04/10/2019 0811   RBC 5.33 04/10/2019 0811   HGB 17.1 04/10/2019 0811   HCT 48.7 04/10/2019 0811   PLT 198 04/10/2019 0811   MCV 91.4 04/10/2019 0811   MCH 32.1 04/10/2019 0811   MCHC 35.1 04/10/2019 0811   RDW 12.1 04/10/2019 0811   LYMPHSABS 2,627 04/10/2019 0811   MONOABS 0.4 09/19/2014 1716   EOSABS 81 04/10/2019 0811   BASOSABS 58 04/10/2019 0811    No results found for: POCLITH, LITHIUM   No results found for: PHENYTOIN, PHENOBARB, VALPROATE, CBMZ   .res Assessment: Plan:    Plan:  PDMP reviewed  1. Continue Cymbalta 120mg  daily  2. Continue Risperdal 1mg  at hs   Read and reviewed note with patient for accuracy.   RTC 6 weeks  Patient advised to contact office with any questions, adverse effects, or acute worsening in signs and symptoms.  Discussed potential metabolic side effects associated with atypical antipsychotics, as well as potential risk  for movement side effects. Advised pt to contact office if movement side effects occur.      There are no diagnoses linked to this encounter.  Please see After Visit Summary for patient specific instructions.  Future Appointments  Date Time Provider [place]  07/11/2020  1:20 PM Breeback, Jade L, PA-C [facility] None    No orders of the defined types were placed in this encounter.     -------------------------------

## 2020-03-12 ENCOUNTER — Other Ambulatory Visit: Payer: Self-pay

## 2020-03-12 DIAGNOSIS — I10 Essential (primary) hypertension: Secondary | ICD-10-CM

## 2020-03-12 DIAGNOSIS — I700 Atherosclerosis of aorta: Secondary | ICD-10-CM

## 2020-03-12 DIAGNOSIS — I6521 Occlusion and stenosis of right carotid artery: Secondary | ICD-10-CM

## 2020-03-12 DIAGNOSIS — E782 Mixed hyperlipidemia: Secondary | ICD-10-CM

## 2020-03-12 MED ORDER — ROSUVASTATIN CALCIUM 40 MG PO TABS: 40.0000 mg | ORAL_TABLET | Freq: Every day | ORAL | 0 refills | Status: DC

## 2020-05-31 ENCOUNTER — Other Ambulatory Visit: Payer: Self-pay | Admitting: Cardiology

## 2020-05-31 DIAGNOSIS — I700 Atherosclerosis of aorta: Secondary | ICD-10-CM

## 2020-05-31 DIAGNOSIS — I10 Essential (primary) hypertension: Secondary | ICD-10-CM

## 2020-05-31 DIAGNOSIS — E782 Mixed hyperlipidemia: Secondary | ICD-10-CM

## 2020-05-31 DIAGNOSIS — I6521 Occlusion and stenosis of right carotid artery: Secondary | ICD-10-CM

## 2020-07-11 ENCOUNTER — Ambulatory Visit: Payer: PPO | Admitting: Physician Assistant

## 2020-07-11 ENCOUNTER — Telehealth: Payer: Self-pay | Admitting: Neurology

## 2020-07-11 NOTE — Telephone Encounter (Signed)
Yes I can prescribe that.

## 2020-07-11 NOTE — Telephone Encounter (Signed)
Patient left vm stating his Urologist retired, they want him to see a new doctor but he wants to know if you will just take over refilling his Myrbetriq. He has appointment Monday. Please advise.

## 2020-07-11 NOTE — Telephone Encounter (Signed)
LMOM letting patient know. 

## 2020-07-14 ENCOUNTER — Other Ambulatory Visit: Payer: Self-pay

## 2020-07-14 ENCOUNTER — Ambulatory Visit (INDEPENDENT_AMBULATORY_CARE_PROVIDER_SITE_OTHER): Payer: PPO | Admitting: Physician Assistant

## 2020-07-14 VITALS — BP 131/86 | HR 89 | Ht 70.0 in | Wt 189.0 lb

## 2020-07-14 DIAGNOSIS — J432 Centrilobular emphysema: Secondary | ICD-10-CM

## 2020-07-14 DIAGNOSIS — Z23 Encounter for immunization: Secondary | ICD-10-CM

## 2020-07-14 DIAGNOSIS — E782 Mixed hyperlipidemia: Secondary | ICD-10-CM | POA: Diagnosis not present

## 2020-07-14 DIAGNOSIS — K649 Unspecified hemorrhoids: Secondary | ICD-10-CM

## 2020-07-14 DIAGNOSIS — Z79899 Other long term (current) drug therapy: Secondary | ICD-10-CM

## 2020-07-14 DIAGNOSIS — Z1211 Encounter for screening for malignant neoplasm of colon: Secondary | ICD-10-CM

## 2020-07-14 DIAGNOSIS — I2584 Coronary atherosclerosis due to calcified coronary lesion: Secondary | ICD-10-CM | POA: Diagnosis not present

## 2020-07-14 DIAGNOSIS — I700 Atherosclerosis of aorta: Secondary | ICD-10-CM | POA: Diagnosis not present

## 2020-07-14 DIAGNOSIS — Z86010 Personal history of colonic polyps: Secondary | ICD-10-CM | POA: Diagnosis not present

## 2020-07-14 DIAGNOSIS — N3281 Overactive bladder: Secondary | ICD-10-CM | POA: Diagnosis not present

## 2020-07-14 DIAGNOSIS — I2510 Atherosclerotic heart disease of native coronary artery without angina pectoris: Secondary | ICD-10-CM

## 2020-07-14 MED ORDER — HYDROCORTISONE (PERIANAL) 2.5 % EX CREA: 1.0000 "application " | TOPICAL_CREAM | Freq: Two times a day (BID) | CUTANEOUS | 1 refills | Status: DC

## 2020-07-14 MED ORDER — MYRBETRIQ 50 MG PO TB24: 50.0000 mg | ORAL_TABLET | Freq: Every day | ORAL | 11 refills | Status: DC

## 2020-07-14 NOTE — Progress Notes (Signed)
Subjective:    Patient ID: Edward Cuevas, male    DOB: 02/07/1956, 64 y.o.   MRN: [id number]  HPI Patient is a 64 year old male with CAD, hypertension, COPD, OAB who presents to the clinic for medication refills.  Patient's urologist did retire and he is asking me to take over his Myrbetriq.  He is taking Myrbetriq and Flomax.  His symptoms are controlled.  He denies any chest pain, palpitations, headaches, vision changes.  He also sees cardiology.  Patient is very compliant with his medications.  Patient reports no problems breathing.  He is using his inhalers daily.  .. Active Ambulatory Problems    Diagnosis Date Noted   Mixed hyperlipidemia    Hypertension    Prediabetes    Vitamin D deficiency    DJD 10/23/2013   DDD (degenerative disc disease), lumbosacral 10/23/2013   Panic attacks 10/04/2014   History of hepatitis C 10/08/2014   Right inguinal hernia 10/08/2014   GAD (generalized anxiety disorder) 11/01/2014   Actinic keratosis 06/27/2015   BPH without obstruction/lower urinary tract symptoms 11/25/2015   Chronic pain of multiple joints 11/25/2015   Fatty liver 10/01/2016   Chronic obstructive pulmonary disease ([place]) 12/29/2016   Lumbar radiculopathy 01/18/2017   Right carotid bruit 04/09/2019   Right lower quadrant abdominal pain 04/09/2019   Current smoker 04/09/2019   Abdominal distension (gaseous) 04/11/2019   Adrenal adenoma 04/17/2019   Carotid stenosis, right 04/17/2019   Centrilobular emphysema ([place]) 05/16/2019   Moderate episode of recurrent major depressive disorder ([place]) 05/16/2019   Aortic atherosclerosis ([place]) 05/16/2019   Coronary artery disease due to calcified coronary lesion 05/16/2019   Pulmonary nodules 05/16/2019   Dyspnea on exertion 05/31/2019   OAB (overactive bladder) 07/12/2019   Resolved Ambulatory Problems    Diagnosis Date Noted   Encounter for long-term (current) use of other medications 04/08/2014    Olecranon bursitis 01/31/2015   Urinary frequency 12/29/2016   Routine physical examination 12/29/2016   Right ankle pain 02/21/2017   Past Medical History:  Diagnosis Date   Anxiety    Arthritis    Cancer ([place])    COPD (chronic obstructive pulmonary disease) ([place])    Depression    Hepatitis    Hyperlipidemia        Review of Systems See HPI.     Objective:   Physical Exam Vitals reviewed.  Constitutional:      Appearance: Normal appearance.  HENT:     Head: Normocephalic.  Cardiovascular:     Rate and Rhythm: Normal rate and regular rhythm.     Pulses: Normal pulses.  Pulmonary:     Effort: Pulmonary effort is normal.     Comments: Coarse breath sounds Abdominal:     General: Bowel sounds are normal. There is no distension.     Palpations: Abdomen is soft.     Tenderness: There is no abdominal tenderness.  Musculoskeletal:     Right lower leg: No edema.     Left lower leg: No edema.  Skin:    Comments: Multiple AK and hypopigmented scars on bilateral arms.   Neurological:     General: No focal deficit present.     Mental Status: He is alert and oriented to person, place, and time.  Psychiatric:        Mood and Affect: Mood normal.           Assessment & Plan:  .Marland KitchenJohn was seen today for follow-up.  Diagnoses and all orders for this  visit:  OAB (overactive bladder) -     MYRBETRIQ 50 MG TB24 tablet; Take 1 tablet (50 mg total) by mouth daily. -     PSA  Flu vaccine need -     Flu Vaccine QUAD High Dose(Fluad)  Centrilobular emphysema (HCC) -     COMPLETE METABOLIC PANEL WITH GFR  Colon cancer screening -     Ambulatory referral to Gastroenterology  History of colon polyps -     Ambulatory referral to Gastroenterology -     COMPLETE METABOLIC PANEL WITH GFR  Aortic atherosclerosis (HCC) -     COMPLETE METABOLIC PANEL WITH GFR -     Lipid Panel w/reflex Direct LDL  Coronary artery disease due to calcified coronary lesion -      Lipid Panel w/reflex Direct LDL  Mixed hyperlipidemia -     COMPLETE METABOLIC PANEL WITH GFR -     Lipid Panel w/reflex Direct LDL  Medication management -     COMPLETE METABOLIC PANEL WITH GFR  Hemorrhoids, unspecified hemorrhoid type -     hydrocortisone (ANUSOL-HC) 2.5 % rectal cream; Place 1 application rectally 2 (two) times daily.   Needs labs. Ordered today.  Refilled medications.  Ok to take over [employer].  covid vaccinated with booster.  Overall lungs/vitals stable. Annual CT screen to be done in next month or so. They should be contacting patient.   Colonoscopy ordered.   Follow up 6 months.

## 2020-07-15 ENCOUNTER — Other Ambulatory Visit: Payer: Self-pay | Admitting: Neurology

## 2020-07-15 MED ORDER — TAMSULOSIN HCL 0.4 MG PO CAPS: 0.4000 mg | ORAL_CAPSULE | Freq: Every day | ORAL | 11 refills | Status: DC

## 2020-07-15 NOTE — Progress Notes (Signed)
Patient also needs Luvenia Starch to take over Tamsulosin RX since Urologist retired. She agreed to [employer]. Prescription sent.

## 2020-07-21 ENCOUNTER — Telehealth: Payer: Self-pay | Admitting: Physician Assistant

## 2020-07-21 ENCOUNTER — Encounter: Payer: Self-pay | Admitting: Physician Assistant

## 2020-07-21 ENCOUNTER — Telehealth: Payer: Self-pay | Admitting: Neurology

## 2020-07-21 NOTE — Telephone Encounter (Signed)
Did we call for colonoscopy?

## 2020-07-21 NOTE — Telephone Encounter (Signed)
Thanks ok.

## 2020-07-21 NOTE — Telephone Encounter (Signed)
Patient due for colonoscopy. Ordered at last visit. They sent referral to [facility]. Last was 2011.

## 2020-07-21 NOTE — Telephone Encounter (Signed)
Patient left vm stating he wanted to get labs done in [place] and wanted to know if that was possible. Called back and LMOM letting him know [facility] diagnostic information below. He is to call with any further questions.   [address], [place] [address] Phone [phone]

## 2020-07-29 DIAGNOSIS — I700 Atherosclerosis of aorta: Secondary | ICD-10-CM | POA: Diagnosis not present

## 2020-07-29 DIAGNOSIS — I2510 Atherosclerotic heart disease of native coronary artery without angina pectoris: Secondary | ICD-10-CM | POA: Diagnosis not present

## 2020-07-29 DIAGNOSIS — Z79899 Other long term (current) drug therapy: Secondary | ICD-10-CM | POA: Diagnosis not present

## 2020-07-29 DIAGNOSIS — E782 Mixed hyperlipidemia: Secondary | ICD-10-CM | POA: Diagnosis not present

## 2020-07-29 DIAGNOSIS — Z86010 Personal history of colonic polyps: Secondary | ICD-10-CM | POA: Diagnosis not present

## 2020-07-29 DIAGNOSIS — N3281 Overactive bladder: Secondary | ICD-10-CM | POA: Diagnosis not present

## 2020-07-29 DIAGNOSIS — J432 Centrilobular emphysema: Secondary | ICD-10-CM | POA: Diagnosis not present

## 2020-07-29 DIAGNOSIS — I2584 Coronary atherosclerosis due to calcified coronary lesion: Secondary | ICD-10-CM | POA: Diagnosis not present

## 2020-07-31 ENCOUNTER — Other Ambulatory Visit: Payer: Self-pay

## 2020-07-31 DIAGNOSIS — R899 Unspecified abnormal finding in specimens from other organs, systems and tissues: Secondary | ICD-10-CM

## 2020-08-01 ENCOUNTER — Other Ambulatory Visit: Payer: Self-pay | Admitting: Neurology

## 2020-08-01 DIAGNOSIS — F331 Major depressive disorder, recurrent, moderate: Secondary | ICD-10-CM

## 2020-08-01 MED ORDER — DULOXETINE HCL 60 MG PO CPEP: 60.0000 mg | ORAL_CAPSULE | Freq: Two times a day (BID) | ORAL | 1 refills | Status: DC

## 2020-08-04 LAB — LIPID PANEL W/REFLEX DIRECT LDL
Cholesterol: 130 mg/dL (ref <200)
HDL: 47 mg/dL (ref >=40)
LDL Cholesterol (Calc): 67 mg/dL (calc)
Non-HDL Cholesterol (Calc): 83 mg/dL (calc) (ref <130)
Total CHOL/HDL Ratio: 2.8 (calc) (ref <5.0)
Triglycerides: 84 mg/dL (ref <150)

## 2020-08-04 LAB — HEPATIC FUNCTION PANEL
AG Ratio: 1.5 (calc) (ref 1.0–2.5)
ALT: 87 U/L — ABNORMAL HIGH (ref 9–46)
AST: 63 U/L — ABNORMAL HIGH (ref 10–35)
Albumin: 4.4 g/dL (ref 3.6–5.1)
Alkaline phosphatase (APISO): 53 U/L (ref 35–144)
Globulin: 3 g/dL (calc) (ref 1.9–3.7)
Total Bilirubin: 0.6 mg/dL (ref 0.2–1.2)
Total Protein: 7.4 g/dL (ref 6.1–8.1)

## 2020-08-04 LAB — COMPLETE METABOLIC PANEL WITH GFR
AG Ratio: 1.5 (calc) (ref 1.0–2.5)
ALT: 87 U/L — ABNORMAL HIGH (ref 9–46)
AST: 63 U/L — ABNORMAL HIGH (ref 10–35)
Albumin: 4.4 g/dL (ref 3.6–5.1)
Alkaline phosphatase (APISO): 53 U/L (ref 35–144)
BUN: 10 mg/dL (ref 7–25)
CO2: 30 mmol/L (ref 20–32)
Calcium: 9.3 mg/dL (ref 8.6–10.3)
Chloride: 102 mmol/L (ref 98–110)
Creat: 0.86 mg/dL (ref 0.70–1.25)
GFR, Est African American: 105 mL/min/{1.73_m2} (ref >=60)
GFR, Est Non African American: 91 mL/min/{1.73_m2} (ref >=60)
Globulin: 3 g/dL (calc) (ref 1.9–3.7)
Glucose, Bld: 159 mg/dL — ABNORMAL HIGH (ref 65–139)
Potassium: 4.2 mmol/L (ref 3.5–5.3)
Sodium: 137 mmol/L (ref 135–146)
Total Bilirubin: 0.6 mg/dL (ref 0.2–1.2)
Total Protein: 7.4 g/dL (ref 6.1–8.1)

## 2020-08-04 LAB — TEST AUTHORIZATION

## 2020-08-04 LAB — PSA: PSA: 1.59 ng/mL (ref <=4.0)

## 2020-08-23 ENCOUNTER — Other Ambulatory Visit: Payer: Self-pay | Admitting: Cardiology

## 2020-08-23 DIAGNOSIS — I10 Essential (primary) hypertension: Secondary | ICD-10-CM

## 2020-08-23 DIAGNOSIS — E782 Mixed hyperlipidemia: Secondary | ICD-10-CM

## 2020-08-23 DIAGNOSIS — I700 Atherosclerosis of aorta: Secondary | ICD-10-CM

## 2020-08-23 DIAGNOSIS — I6521 Occlusion and stenosis of right carotid artery: Secondary | ICD-10-CM

## 2020-08-25 ENCOUNTER — Other Ambulatory Visit: Payer: Self-pay | Admitting: Cardiology

## 2020-08-25 DIAGNOSIS — I6521 Occlusion and stenosis of right carotid artery: Secondary | ICD-10-CM

## 2020-08-25 DIAGNOSIS — I10 Essential (primary) hypertension: Secondary | ICD-10-CM

## 2020-08-25 DIAGNOSIS — E782 Mixed hyperlipidemia: Secondary | ICD-10-CM

## 2020-08-25 DIAGNOSIS — I700 Atherosclerosis of aorta: Secondary | ICD-10-CM

## 2020-09-16 ENCOUNTER — Encounter: Payer: Self-pay | Admitting: Adult Health

## 2020-09-16 ENCOUNTER — Ambulatory Visit (INDEPENDENT_AMBULATORY_CARE_PROVIDER_SITE_OTHER): Payer: PPO | Admitting: Adult Health

## 2020-09-16 ENCOUNTER — Other Ambulatory Visit: Payer: Self-pay

## 2020-09-16 DIAGNOSIS — F411 Generalized anxiety disorder: Secondary | ICD-10-CM

## 2020-09-16 DIAGNOSIS — G4700 Insomnia, unspecified: Secondary | ICD-10-CM | POA: Diagnosis not present

## 2020-09-16 DIAGNOSIS — F331 Major depressive disorder, recurrent, moderate: Secondary | ICD-10-CM

## 2020-09-16 DIAGNOSIS — F410 Panic disorder [episodic paroxysmal anxiety] without agoraphobia: Secondary | ICD-10-CM | POA: Diagnosis not present

## 2020-09-16 MED ORDER — ALPRAZOLAM 0.5 MG PO TABS: 0.5000 mg | ORAL_TABLET | Freq: Every evening | ORAL | 2 refills | Status: DC | PRN

## 2020-09-16 MED ORDER — DULOXETINE HCL 60 MG PO CPEP: 60.0000 mg | ORAL_CAPSULE | Freq: Every day | ORAL | 2 refills | Status: DC

## 2020-09-16 NOTE — Progress Notes (Signed)
Edward Cuevas [id number] [birth year] 64 y.o.  Subjective:   Patient ID:  Edward Cuevas is a 64 y.o. (DOB 11-01-1955) male.  Chief Complaint: No chief complaint on file.   HPI [name] presents to the office today for follow-up of MDD, anxiety, panic attacks, and insomnia.   Describes mood today as "good". Pleasant. Mood symptoms - reports depression, anxiety, and irritability. Stating "I'm almost scared to go to bed at night". Having panic attacks - worse over the past month. Laying in bed and having "bad thoughts about what could happen". Avoiding people. Has lost interest in doing things again. Felt like Risperdal was helping initially. Stopped taking the Cymbalta 120mg  - "I didn't think I needed it". Decreased interest and motivation. Taking medications as prescribed.  Energy levels lower. Active, does not have a regular exercise routine with physical disabilities.  Enjoys some usual interests and activities. Divorced. Lives with ex wife. Has a 46 year old son locally and another son in [place]. Has a sister in [place]. Spending time with family. Appetite adequate. Weight stable - 180 pounds. Sleeps well most nights. Averages 3 hours - mind racing. Focus and concentration improved. Completing tasks. Managing aspects of household. Receiving disability benefits/retirement. 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 Denies SI or HI. Denies AH or VH.  Previous medication trials: Cymbalta, Wellbutrin   GAD-7     Office Visit from 07/14/2020 in [facility] Office Visit from 01/09/2020 in [facility] Office Visit from 11/28/2019 in [facility] Office Visit from 07/11/2019 in [facility] Visit from 05/16/2019 in [facility]  Total GAD-7 Score 0 6 3 0 1    PHQ2-9     Office Visit from 07/14/2020 in [facility] Office Visit from 01/09/2020 in [facility] Office Visit from 11/28/2019 in [facility] Office Visit from 07/11/2019 in [facility] from 05/15/2019 in [facility]  PHQ-2 Total Score 0 4 6 2 3   PHQ-9 Total Score 4 10 17 5 7        Review of Systems:  Review of Systems  Musculoskeletal: Negative for gait problem.  Neurological: Negative for tremors.  Psychiatric/Behavioral:       Please refer to HPI    Medications: I have reviewed the patient's current medications.  Current Outpatient Medications  Medication Sig Dispense Refill   acetaminophen (TYLENOL 8 HOUR ARTHRITIS PAIN) 650 MG CR tablet Take 650 mg by mouth 2 (two) times daily.     ALPRAZolam (XANAX) 0.5 MG tablet Take 1 tablet (0.5 mg total) by mouth at bedtime as needed for anxiety. 30 tablet 2   aspirin EC 81 MG tablet Take 1 tablet (81 mg total) by mouth daily. 90 tablet 3   DULoxetine (CYMBALTA) 60 MG capsule Take 1 capsule (60 mg total) by mouth 2 (two) times daily. 180 capsule 1   DULoxetine (CYMBALTA) 60 MG capsule Take 1 capsule (60 mg total) by mouth daily. 30 capsule 2   Fluticasone-Umeclidin-Vilant 200-62.5-25 MCG/INH AEPB Inhale 1 puff into the lungs daily. 60 each 5   gabapentin (NEURONTIN) 300 MG capsule TAKE 2 CAPSULES BY MOUTH TWICE DAILY 360 capsule 3   hydrocortisone (ANUSOL-HC) 2.5 % rectal cream Place 1 application  rectally 2 (two) times daily. 60 g 1   MYRBETRIQ 50 MG TB24 tablet Take 1 tablet (50 mg total) by mouth daily. 30 tablet 11   nitroGLYCERIN (NITROSTAT) 0.4 MG SL tablet Place 1 tablet (0.4 mg total) under the tongue every 5 (five) minutes as needed for chest pain. (Patient not taking: Reported on 01/09/2020) 25 tablet 3   risperiDONE (RISPERDAL) 1 MG tablet Take one tablet at bedtime. 90 tablet 1    rosuvastatin (CRESTOR) 40 MG tablet TAKE 1 TABLET(40 MG) BY MOUTH DAILY 30 tablet 0   tamsulosin (FLOMAX) 0.4 MG CAPS capsule Take 1 capsule (0.4 mg total) by mouth at bedtime. 30 capsule 11   No current facility-administered medications for this visit.    Medication Side Effects: None  Allergies:  Allergies  Allergen Reactions   Abilify [Aripiprazole]     Feel weird.    Penicillins Itching and Rash    Has patient had a PCN reaction causing immediate rash, facial/tongue/throat swelling, SOB or lightheadedness with hypotension: Yes Has patient had a PCN reaction causing severe rash involving mucus membranes or skin necrosis: No Has patient had a PCN reaction that required hospitalization No Has patient had a PCN reaction occurring within the last 10 years: No If all of the above answers are "NO", then may proceed with Cephalosporin use.     Past Medical History:  Diagnosis Date   Anxiety    Arthritis    Cancer ([place])    skin   COPD (chronic obstructive pulmonary disease) ([place])    per pt ?   Depression    Hepatitis    A,B,C   Hyperlipidemia    Hypertension    Prediabetes    Vitamin D deficiency     Family History  Problem Relation Age of Onset   Diabetes Mother    Heart disease Father    Heart attack Father    Heart disease Maternal Grandmother     Social History   Socioeconomic History   Marital status: Divorced    Spouse name: Not on file   Number of children: 2   Years of education: 11   Highest education level: 11th grade  Occupational History   Occupation: superintendant    Comment: retired  Tobacco Use   Smoking status: Current Every Day Smoker    Packs/day: 1.00    Years: 30.00    Pack years: 30.00    Types: Cigarettes   Smokeless tobacco: Never Used  Scientific laboratory technician Use: Never used  Substance and Sexual Activity   Alcohol use: Yes    Alcohol/week: 3.0 standard drinks    Types: 3 Cans of beer per week     Comment: daily   Drug use: No   Sexual activity: Not Currently  Other Topics Concern   Not on file  Social History Narrative   Not on file   Social Determinants of Health   Financial Resource Strain:    Difficulty of Paying Living Expenses: Not on file  Food Insecurity:    Worried About [place] in the Last Year: Not on file   Ran Out of Food in the Last Year: Not on file  Transportation Needs:    Lack of Transportation (Medical): Not on file   Lack of Transportation (Non-Medical): Not on file  Physical Activity:    Days of Exercise per Week: Not on file   Minutes of Exercise per Session: Not on file  Stress:  Feeling of Stress : Not on file  Social Connections:    Frequency of Communication with Friends and Family: Not on file   Frequency of Social Gatherings with Friends and Family: Not on file   Attends Religious Services: Not on file   Active Member of Clubs or Organizations: Not on file   Attends Archivist Meetings: Not on file   Marital Status: Not on file  Intimate Partner Violence:    Fear of Current or Ex-Partner: Not on file   Emotionally Abused: Not on file   Physically Abused: Not on file   Sexually Abused: Not on file    Past Medical History, Surgical history, Social history, and Family history were reviewed and updated as appropriate.   Please see review of systems for further details on the patient's review from today.   Objective:   Physical Exam:  There were no vitals taken for this visit.  Physical Exam Constitutional:      General: He is not in acute distress. Musculoskeletal:        General: No deformity.  Neurological:     Mental Status: He is alert and oriented to person, place, and time.     Coordination: Coordination normal.  Psychiatric:        Attention and Perception: Attention and perception normal. He does not perceive auditory or visual hallucinations.        Mood and Affect: Mood  normal. Mood is not anxious or depressed. Affect is not labile, blunt, angry or inappropriate.        Speech: Speech normal.        Behavior: Behavior normal.        Thought Content: Thought content normal. Thought content is not paranoid or delusional. Thought content does not include homicidal or suicidal ideation. Thought content does not include homicidal or suicidal plan.        Cognition and Memory: Cognition and memory normal.        Judgment: Judgment normal.     Comments: Insight intact     Lab Review:     Component Value Date/Time   NA 137 07/29/2020 1305   NA 136 07/16/2019 1151   K 4.2 07/29/2020 1305   CL 102 07/29/2020 1305   CO2 30 07/29/2020 1305   GLUCOSE 159 (H) 07/29/2020 1305   BUN 10 07/29/2020 1305   BUN 10 07/16/2019 1151   CREATININE 0.86 07/29/2020 1305   CALCIUM 9.3 07/29/2020 1305   PROT 7.4 07/29/2020 1305   PROT 7.4 07/29/2020 1305   PROT 7.2 07/16/2019 1151   ALBUMIN 4.6 07/16/2019 1151   AST 63 (H) 07/29/2020 1305   AST 63 (H) 07/29/2020 1305   ALT 87 (H) 07/29/2020 1305   ALT 87 (H) 07/29/2020 1305   ALKPHOS 60 07/16/2019 1151   BILITOT 0.6 07/29/2020 1305   BILITOT 0.6 07/29/2020 1305   BILITOT 0.2 07/16/2019 1151   GFRNONAA 91 07/29/2020 1305   GFRAA 105 07/29/2020 1305       Component Value Date/Time   WBC 5.8 04/10/2019 0811   RBC 5.33 04/10/2019 0811   HGB 17.1 04/10/2019 0811   HCT 48.7 04/10/2019 0811   PLT 198 04/10/2019 0811   MCV 91.4 04/10/2019 0811   MCH 32.1 04/10/2019 0811   MCHC 35.1 04/10/2019 0811   RDW 12.1 04/10/2019 0811   LYMPHSABS 2,627 04/10/2019 0811   MONOABS 0.4 09/19/2014 1716   EOSABS 81 04/10/2019 0811   BASOSABS 58 04/10/2019 0811  No results found for: POCLITH, LITHIUM   No results found for: PHENYTOIN, PHENOBARB, VALPROATE, CBMZ   .res Assessment: Plan:    Plan:  PDMP reviewed  1. Add Cymbalta 30mg  daily x 7 days, then increase to 60mg  daily - patient tapered off medication since last  visit - has medications. 2. Continue Risperdal 1mg  at hs  3. Add Xanax 0.5mg  at bedtime.   Read and reviewed note with patient for accuracy.   RTC 4 weeks  Patient advised to contact office with any questions, adverse effects, or acute worsening in signs and symptoms.  Discussed potential metabolic side effects associated with atypical antipsychotics, as well as potential risk for movement side effects. Advised pt to contact office if movement side effects occur.     Diagnoses and all orders for this visit:  Moderate episode of recurrent major depressive disorder (HCC) -     DULoxetine (CYMBALTA) 60 MG capsule; Take 1 capsule (60 mg total) by mouth daily.  Insomnia, unspecified type  Generalized anxiety disorder -     DULoxetine (CYMBALTA) 60 MG capsule; Take 1 capsule (60 mg total) by mouth daily.  Panic attacks -     ALPRAZolam (XANAX) 0.5 MG tablet; Take 1 tablet (0.5 mg total) by mouth at bedtime as needed for anxiety.     Please see After Visit Summary for patient specific instructions.  Future Appointments  Date Time Provider [place]  01/12/2021  3:40 PM Donella Stade, PA-C [facility] None    No orders of the defined types were placed in this encounter.   -------------------------------

## 2020-09-18 ENCOUNTER — Other Ambulatory Visit: Payer: Self-pay | Admitting: Physician Assistant

## 2020-09-19 ENCOUNTER — Other Ambulatory Visit: Payer: Self-pay | Admitting: Physician Assistant

## 2020-10-10 ENCOUNTER — Other Ambulatory Visit: Payer: Self-pay | Admitting: Adult Health

## 2020-10-10 ENCOUNTER — Other Ambulatory Visit: Payer: Self-pay | Admitting: Cardiology

## 2020-10-10 DIAGNOSIS — E782 Mixed hyperlipidemia: Secondary | ICD-10-CM

## 2020-10-10 DIAGNOSIS — I700 Atherosclerosis of aorta: Secondary | ICD-10-CM

## 2020-10-10 DIAGNOSIS — G4700 Insomnia, unspecified: Secondary | ICD-10-CM

## 2020-10-10 DIAGNOSIS — I6521 Occlusion and stenosis of right carotid artery: Secondary | ICD-10-CM

## 2020-10-10 DIAGNOSIS — I10 Essential (primary) hypertension: Secondary | ICD-10-CM

## 2020-10-13 ENCOUNTER — Other Ambulatory Visit: Payer: Self-pay | Admitting: Cardiology

## 2020-10-13 DIAGNOSIS — I700 Atherosclerosis of aorta: Secondary | ICD-10-CM

## 2020-10-13 DIAGNOSIS — I10 Essential (primary) hypertension: Secondary | ICD-10-CM

## 2020-10-13 DIAGNOSIS — I6521 Occlusion and stenosis of right carotid artery: Secondary | ICD-10-CM

## 2020-10-13 DIAGNOSIS — E782 Mixed hyperlipidemia: Secondary | ICD-10-CM

## 2020-10-28 ENCOUNTER — Encounter: Payer: Self-pay | Admitting: Adult Health

## 2020-10-28 ENCOUNTER — Ambulatory Visit (INDEPENDENT_AMBULATORY_CARE_PROVIDER_SITE_OTHER): Payer: PPO | Admitting: Adult Health

## 2020-10-28 ENCOUNTER — Other Ambulatory Visit: Payer: Self-pay

## 2020-10-28 DIAGNOSIS — F410 Panic disorder [episodic paroxysmal anxiety] without agoraphobia: Secondary | ICD-10-CM

## 2020-10-28 DIAGNOSIS — F331 Major depressive disorder, recurrent, moderate: Secondary | ICD-10-CM

## 2020-10-28 DIAGNOSIS — F411 Generalized anxiety disorder: Secondary | ICD-10-CM | POA: Diagnosis not present

## 2020-10-28 DIAGNOSIS — G4700 Insomnia, unspecified: Secondary | ICD-10-CM | POA: Diagnosis not present

## 2020-10-28 MED ORDER — ALPRAZOLAM 0.5 MG PO TABS: 0.5000 mg | ORAL_TABLET | Freq: Two times a day (BID) | ORAL | 2 refills | Status: DC

## 2020-10-28 MED ORDER — DULOXETINE HCL 60 MG PO CPEP: 60.0000 mg | ORAL_CAPSULE | Freq: Every day | ORAL | 2 refills | Status: DC

## 2020-10-28 MED ORDER — RISPERIDONE 1 MG PO TABS: ORAL_TABLET | ORAL | 2 refills | Status: DC

## 2020-10-28 NOTE — Progress Notes (Signed)
Edward Cuevas [id number] 03/06/1956 65 y.o.  Subjective:   Patient ID:  Edward Cuevas is a 65 y.o. (DOB 05-Nov-1955) male.  Chief Complaint: No chief complaint on file.   HPI Deny Chevez presents to the office today for follow-up of MDD, anxiety, panic attacks, and insomnia.   Describes mood today as "good". Pleasant. Mood symptoms - decreased depression, anxiety, and irritability. Stating "I'm dong much better". Denies panic attacks. Denies worry and ruminations. Not being "short" around people. Less avoidance. Has started crafting again. Looks for to going to bed again. Improved interest and motivation. Taking medications as prescribed.  Energy levels "better". Active, does not have a regular exercise routine with physical disabilities.  Enjoys some usual interests and activities. Divorced. Lives with ex wife. Has a 57 year old son locally and another son in [place]. Has a sister in [place]. Spending time with family. Appetite adequate. Weight stable - 180 pounds. Sleeps well most nights. Averages 5 to 6 hours - "maybe more". Feels "rested". Focus and concentration improved. Completing tasks. Managing aspects of household. Receiving disability benefits/retirement. 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 Denies SI or HI.  Denies AH or VH.  Previous medication trials: Cymbalta, Wellbutrin    GAD-7   Flowsheet Row Office Visit from 07/14/2020 in [place] Office Visit from 01/09/2020 in [facility] Office Visit from 11/28/2019 in [place] Office Visit from 07/11/2019 in [place] Visit from 05/16/2019 in [place]  Total GAD-7 Score 0 6 3 0 1    [place] Office Visit from 07/14/2020 in [place] Office Visit from 01/09/2020 in [place] Office Visit from 11/28/2019 in [facility] Office Visit from 07/11/2019 in [place] from 05/15/2019 in [place]  PHQ-2 Total Score 0 4 6 2 3   PHQ-9 Total Score 4 10 17 5 7        Review of Systems:  Review of Systems  Musculoskeletal: Negative for gait problem.  Neurological: Negative for tremors.  Psychiatric/Behavioral:       Please refer to HPI    Medications: I have reviewed the patient's current medications.  Current Outpatient Medications  Medication Sig Dispense Refill  . TRELEGY ELLIPTA 100-62.5-25 MCG/INH AEPB INHALE 1 PUFF INTO THE LUNGS DAILY 60 each 5  . acetaminophen (TYLENOL 8 HOUR ARTHRITIS PAIN) 650 MG CR tablet Take 650 mg by mouth 2 (two) times daily.    [name] ALPRAZolam (XANAX) 0.5 MG tablet Take 1 tablet (0.5 mg total) by mouth 2 (two) times daily. 60 tablet 2  . aspirin EC 81 MG tablet Take 1 tablet (81 mg total) by mouth daily. 90 tablet 3  . DULoxetine (CYMBALTA) 60 MG capsule Take 1 capsule (60 mg total) by mouth 2 (two) times daily. 180 capsule 1  . DULoxetine (CYMBALTA) 60 MG capsule Take 1 capsule (60 mg total) by mouth daily. 30 capsule 2  . Fluticasone-Umeclidin-Vilant 200-62.5-25 MCG/INH AEPB Inhale 1 puff into the lungs daily. 60 each 5  . gabapentin (NEURONTIN) 300 MG capsule TAKE 2 CAPSULES BY MOUTH TWICE DAILY 360 capsule 3  . hydrocortisone (ANUSOL-HC) 2.5 % rectal cream Place 1 application rectally 2 (two) times daily. [place]  g 1  . MYRBETRIQ 50 MG TB24 tablet Take 1 tablet (50 mg total) by mouth daily. 30 tablet 11  . nitroGLYCERIN (NITROSTAT) 0.4 MG SL tablet Place 1 tablet (0.4 mg total) under the tongue every 5 (five) minutes as needed for chest pain. (Patient not taking: Reported on 01/09/2020) 25 tablet 3  . risperiDONE (RISPERDAL) 1 MG tablet TAKE ONE TABLET AT BEDTIME 30 tablet 2  .  rosuvastatin (CRESTOR) 40 MG tablet Take 1 tablet (40 mg total) by mouth daily. 30 tablet 0  . tamsulosin (FLOMAX) 0.4 MG CAPS capsule Take 1 capsule (0.4 mg total) by mouth at bedtime. 30 capsule 11   No current facility-administered medications for this visit.    Medication Side Effects: None  Allergies:  Allergies  Allergen Reactions  . Abilify [Aripiprazole]     Feel weird.   . Penicillins Itching and Rash    Has patient had a PCN reaction causing immediate rash, facial/tongue/throat swelling, SOB or lightheadedness with hypotension: Yes Has patient had a PCN reaction causing severe rash involving mucus membranes or skin necrosis: No Has patient had a PCN reaction that required hospitalization No Has patient had a PCN reaction occurring within the last 10 years: No If all of the above answers are "NO", then may proceed with Cephalosporin use.     Past Medical History:  Diagnosis Date  . Anxiety   . Arthritis   . Cancer ([place])    skin  . COPD (chronic obstructive pulmonary disease) ([place])    per pt ?  [name] Depression   . Hepatitis    A,B,C  . Hyperlipidemia   . Hypertension   . Prediabetes   . Vitamin D deficiency     Family History  Problem Relation Age of Onset  . Diabetes Mother   . Heart disease Father   . Heart attack Father   . Heart disease Maternal Grandmother     Social History   Socioeconomic History  . Marital status: Divorced    Spouse name: Not on file  . Number of children: 2  . Years of education: 49  . Highest education level: 11th grade  Occupational History  . Occupation: superintendant    Comment: retired  Tobacco Use  . Smoking status: Current Every Day Smoker    Packs/day: 1.00    Years: 30.00    Pack years: 30.00    Types: Cigarettes  . Smokeless tobacco: Never Used  Vaping Use  . Vaping Use: Never used  Substance and Sexual Activity  . Alcohol use: Yes    Alcohol/week: 3.0 standard drinks    Types: 3 Cans of beer per week     Comment: daily  . Drug use: No  . Sexual activity: Not Currently  Other Topics Concern  . Not on file  Social History Narrative  . Not on file   Social Determinants of Health   Financial Resource Strain: Not on file  Food Insecurity: Not on file  Transportation Needs: Not on file  Physical Activity: Not on file  Stress: Not on file  Social Connections: Not on file  Intimate Partner Violence: Not on file    Past Medical History, Surgical history, Social history, and Family history were reviewed and updated as appropriate.   Please see review of systems for further details on the patient's review from today.   Objective:   Physical Exam:  There were no vitals taken for this visit.  Physical Exam Constitutional:  General: He is not in acute distress. Musculoskeletal:        General: No deformity.  Neurological:     Mental Status: He is alert and oriented to person, place, and time.     Coordination: Coordination normal.  Psychiatric:        Attention and Perception: Attention and perception normal. He does not perceive auditory or visual hallucinations.        Mood and Affect: Mood normal. Mood is not anxious or depressed. Affect is not labile, blunt, angry or inappropriate.        Speech: Speech normal.        Behavior: Behavior normal.        Thought Content: Thought content normal. Thought content is not paranoid or delusional. Thought content does not include homicidal or suicidal ideation. Thought content does not include homicidal or suicidal plan.        Cognition and Memory: Cognition and memory normal.        Judgment: Judgment normal.     Comments: Insight intact     Lab Review:     Component Value Date/Time   NA 137 07/29/2020 1305   NA 136 07/16/2019 1151   K 4.2 07/29/2020 1305   CL 102 07/29/2020 1305   CO2 30 07/29/2020 1305   GLUCOSE 159 (H) 07/29/2020 1305   BUN 10 07/29/2020 1305   BUN 10 07/16/2019 1151   CREATININE 0.86 07/29/2020 1305    CALCIUM 9.3 07/29/2020 1305   PROT 7.4 07/29/2020 1305   PROT 7.4 07/29/2020 1305   PROT 7.2 07/16/2019 1151   ALBUMIN 4.6 07/16/2019 1151   AST 63 (H) 07/29/2020 1305   AST 63 (H) 07/29/2020 1305   ALT 87 (H) 07/29/2020 1305   ALT 87 (H) 07/29/2020 1305   ALKPHOS 60 07/16/2019 1151   BILITOT 0.6 07/29/2020 1305   BILITOT 0.6 07/29/2020 1305   BILITOT 0.2 07/16/2019 1151   GFRNONAA 91 07/29/2020 1305   GFRAA 105 07/29/2020 1305       Component Value Date/Time   WBC 5.8 04/10/2019 0811   RBC 5.33 04/10/2019 0811   HGB 17.1 04/10/2019 0811   HCT 48.7 04/10/2019 0811   PLT 198 04/10/2019 0811   MCV 91.4 04/10/2019 0811   MCH 32.1 04/10/2019 0811   MCHC 35.1 04/10/2019 0811   RDW 12.1 04/10/2019 0811   LYMPHSABS 2,627 04/10/2019 0811   MONOABS 0.4 09/19/2014 1716   EOSABS 81 04/10/2019 0811   BASOSABS 58 04/10/2019 0811    No results found for: POCLITH, LITHIUM   No results found for: PHENYTOIN, PHENOBARB, VALPROATE, CBMZ   .res Assessment: Plan:     Plan:  PDMP reviewed  1. Cymbalta 60mg  daily   2. Risperdal 1mg  at hs  3. Xanax 0.5mg  at bedtime to BID  Read and reviewed note with patient for accuracy.   RTC 4 weeks  Patient advised to contact office with any questions, adverse effects, or acute worsening in signs and symptoms.  Discussed potential metabolic side effects associated with atypical antipsychotics, as well as potential risk for movement side effects. Advised pt to contact office if movement side effects occur.    Diagnoses and all orders for this visit:  Moderate episode of recurrent major depressive disorder (HCC) -     DULoxetine (CYMBALTA) 60 MG capsule; Take 1 capsule (60 mg total) by mouth daily.  Generalized anxiety disorder -     DULoxetine (CYMBALTA) 60 MG capsule; Take 1 capsule (60  mg total) by mouth daily.  Insomnia, unspecified type -     risperiDONE (RISPERDAL) 1 MG tablet; TAKE ONE TABLET AT BEDTIME  Panic attacks -      ALPRAZolam (XANAX) 0.5 MG tablet; Take 1 tablet (0.5 mg total) by mouth 2 (two) times daily.     Please see After Visit Summary for patient specific instructions.  Future Appointments  Date Time Provider [facility] [place]  01/12/2021  3:40 PM Donella Stade, PA-C [facility] None    No orders of the defined types were placed in this encounter.   -------------------------------

## 2020-11-09 ENCOUNTER — Other Ambulatory Visit: Payer: Self-pay | Admitting: Cardiology

## 2020-11-09 DIAGNOSIS — I10 Essential (primary) hypertension: Secondary | ICD-10-CM

## 2020-11-09 DIAGNOSIS — I6521 Occlusion and stenosis of right carotid artery: Secondary | ICD-10-CM

## 2020-11-09 DIAGNOSIS — E782 Mixed hyperlipidemia: Secondary | ICD-10-CM

## 2020-11-09 DIAGNOSIS — I700 Atherosclerosis of aorta: Secondary | ICD-10-CM

## 2020-11-17 ENCOUNTER — Telehealth: Payer: Self-pay | Admitting: General Practice

## 2020-12-05 NOTE — Telephone Encounter (Signed)
Documentation only.

## 2020-12-12 DIAGNOSIS — F32A Depression, unspecified: Secondary | ICD-10-CM | POA: Insufficient documentation

## 2020-12-12 DIAGNOSIS — K759 Inflammatory liver disease, unspecified: Secondary | ICD-10-CM | POA: Insufficient documentation

## 2020-12-12 DIAGNOSIS — M1990 Unspecified osteoarthritis, unspecified site: Secondary | ICD-10-CM | POA: Insufficient documentation

## 2020-12-12 DIAGNOSIS — J449 Chronic obstructive pulmonary disease, unspecified: Secondary | ICD-10-CM | POA: Insufficient documentation

## 2020-12-12 DIAGNOSIS — E785 Hyperlipidemia, unspecified: Secondary | ICD-10-CM | POA: Insufficient documentation

## 2020-12-12 DIAGNOSIS — F419 Anxiety disorder, unspecified: Secondary | ICD-10-CM | POA: Insufficient documentation

## 2020-12-12 DIAGNOSIS — C801 Malignant (primary) neoplasm, unspecified: Secondary | ICD-10-CM | POA: Insufficient documentation

## 2020-12-18 ENCOUNTER — Other Ambulatory Visit: Payer: Self-pay | Admitting: Cardiology

## 2020-12-18 DIAGNOSIS — E782 Mixed hyperlipidemia: Secondary | ICD-10-CM

## 2020-12-18 DIAGNOSIS — I10 Essential (primary) hypertension: Secondary | ICD-10-CM

## 2020-12-18 DIAGNOSIS — I6521 Occlusion and stenosis of right carotid artery: Secondary | ICD-10-CM

## 2020-12-18 DIAGNOSIS — I700 Atherosclerosis of aorta: Secondary | ICD-10-CM

## 2020-12-19 ENCOUNTER — Ambulatory Visit: Payer: PPO | Admitting: Cardiology

## 2021-01-12 ENCOUNTER — Ambulatory Visit: Payer: PPO | Admitting: Physician Assistant

## 2021-01-14 NOTE — Progress Notes (Signed)
Cardiology Office Note:    Date:  01/15/2021   ID:  Edward Cuevas, DOB 1956/05/12, MRN [id number]  PCP:  Donella Stade, PA-C  Cardiologist:  Shirlee More, MD    Referring MD: Donella Stade, PA-C    ASSESSMENT:    1. Coronary artery disease of native artery of native heart with stable angina pectoris ([place])   2. Mixed hyperlipidemia   3. Carotid stenosis, right   4. Aortic atherosclerosis ([place])   5. Essential hypertension    PLAN:    In order of problems listed above:  1. Stable CAD his CTA showed no flow-limiting stenosis he is asymptomatic New York Heart Association class I continue treatment including aspirin and his high intensity statin. 2. Stable continue statin lipids are at target 3. Approaching 2 years he had a large volume of plaque in his right ICA recheck duplex if severe stenosis would consider intervention even in the absence of TIA 4. Continue statin with aortic atherosclerosis 5. BP at target currently not on antihypertensive therapy   Next appointment: 1 year we renewed his statin prescription   Medication Adjustments/Labs and Tests Ordered: Current medicines are reviewed at length with the patient today.  Concerns regarding medicines are outlined above.  Orders Placed This Encounter  Procedures  . EKG 12-Lead  . VAS US CAROTID   Meds ordered this encounter  Medications  . rosuvastatin (CRESTOR) 40 MG tablet    Sig: TAKE 1 TABLET(40 MG) BY MOUTH DAILY    Dispense:  90 tablet    Refill:  3    Chief Complaint  Patient presents with  . Follow-up  . Coronary Artery Disease    History of Present Illness:    Edward Cuevas is a 65 y.o. male with a hx of COPD/emphysema coronary artery disease moderate 50 to 69% mid LAD stenosis on cardiac CTA with a very high calcium score 309 90th percentile for age and sex and dyslipidemia last seen 07/16/2019.  He has a right-sided carotid bruit and on duplex June 2020 is found to have moderate to large amount of  right-sided atherosclerotic plaque with stenosis at the higher end of 50 to 69% range and a minimal amount of left-sided atherosclerotic plaque.  Compliance with diet, lifestyle and medications: Yes  In general he is done well not limited by shortness of breath no chest pain palpitations syncope or TIA. He heard concerns about aspirin therapy in the news and stopped taking it. He tolerates his statin without muscle pain or weakness.  His recent lipids show LDL at target He is approaching 2 years from his last carotid duplex Past Medical History:  Diagnosis Date  . Anxiety   . Arthritis   . Cancer ([place])    skin  . COPD (chronic obstructive pulmonary disease) ([place])    per pt ?  [name] Depression   . Hepatitis    A,B,C  . Hyperlipidemia   . Hypertension   . Prediabetes   . Vitamin D deficiency     Past Surgical History:  Procedure Laterality Date  . ELBOW SURGERY Right 1986  . [place]  . HERNIA REPAIR Left 1996  . INGUINAL HERNIA REPAIR Right 12/30/2015   Procedure: RIGHT RIGHT INGUINAL HERNIA REPAIR;  Surgeon: Ralene Ok, MD;  Location: [facility];  Service: General;  Laterality: Right;  . INSERTION OF MESH Right 12/30/2015   Procedure: INSERTION OF MESH;  Surgeon: Ralene Ok, MD;  Location: [facility];  Service: General;  Laterality: Right;  . SHOULDER SURGERY Left 2007    Current Medications: Current Meds  Medication Sig  . acetaminophen (TYLENOL) 650 MG CR tablet Take 650 mg by mouth 2 (two) times daily.  [name] ALPRAZolam (XANAX) 0.5 MG tablet Take 1 tablet (0.5 mg total) by mouth 2 (two) times daily.  [name] aspirin EC 81 MG tablet Take 1 tablet (81 mg total) by mouth daily.  . DULoxetine (CYMBALTA) 60 MG capsule Take 1 capsule (60 mg total) by mouth daily.  . Fluticasone-Umeclidin-Vilant 200-62.5-25 MCG/INH AEPB Inhale 1 puff into the lungs daily.  . hydrocortisone (ANUSOL-HC) 2.5 % rectal cream Place 1 application rectally 2 (two) times daily.  [name] MYRBETRIQ  50 MG TB24 tablet Take 1 tablet (50 mg total) by mouth daily.  . nitroGLYCERIN (NITROSTAT) 0.4 MG SL tablet Place 1 tablet (0.4 mg total) under the tongue every 5 (five) minutes as needed for chest pain.  [name] risperiDONE (RISPERDAL) 1 MG tablet TAKE ONE TABLET AT BEDTIME  . tamsulosin (FLOMAX) 0.4 MG CAPS capsule Take 1 capsule (0.4 mg total) by mouth at bedtime.  . TRELEGY ELLIPTA 100-62.5-25 MCG/INH AEPB INHALE 1 PUFF INTO THE LUNGS DAILY  . [DISCONTINUED] rosuvastatin (CRESTOR) 40 MG tablet TAKE 1 TABLET(40 MG) BY MOUTH DAILY     Allergies:   Abilify [aripiprazole] and Penicillins   Social History   Socioeconomic History  . Marital status: Divorced    Spouse name: Not on file  . Number of children: 2  . Years of education: 62  . Highest education level: 11th grade  Occupational History  . Occupation: superintendant    Comment: retired  Tobacco Use  . Smoking status: Current Every Day Smoker    Packs/day: 1.00    Years: 30.00    Pack years: 30.00    Types: Cigarettes  . Smokeless tobacco: Never Used  Vaping Use  . Vaping Use: Never used  Substance and Sexual Activity  . Alcohol use: Yes    Alcohol/week: 3.0 standard drinks    Types: 3 Cans of beer per week    Comment: daily  . Drug use: No  . Sexual activity: Not Currently  Other Topics Concern  . Not on file  Social History Narrative  . Not on file   Social Determinants of Health   Financial Resource Strain: Not on file  Food Insecurity: Not on file  Transportation Needs: Not on file  Physical Activity: Not on file  Stress: Not on file  Social Connections: Not on file     Family History: The patient's family history includes Diabetes in his mother; Heart attack in his father; Heart disease in his father and maternal grandmother. ROS:   Please see the history of present illness.    All other systems reviewed and are negative.  EKGs/Labs/Other Studies Reviewed:    The following studies were reviewed  today:  EKG:  EKG ordered today and personally reviewed.  The ekg ordered today demonstrates sinus rhythm normal EKG  Recent Labs: 07/29/2020: ALT 87; ALT 87; BUN 10; Creat 0.86; Potassium 4.2; Sodium 137  Recent Lipid Panel    Component Value Date/Time   CHOL 130 07/29/2020 1305   CHOL 120 07/16/2019 1151   TRIG 84 07/29/2020 1305   HDL 47 07/29/2020 1305   HDL 43 07/16/2019 1151   CHOLHDL 2.8 07/29/2020 1305   VLDL 13 08/24/2016 1347   LDLCALC 67 07/29/2020 1305    Physical Exam:    VS:  BP 124/74 (BP Location:  Right Arm, Patient Position: Sitting, Cuff Size: Normal)   Pulse 86   Ht 5\' 10"  (1.778 m)   Wt 189 lb 6.4 oz (85.9 kg)   SpO2 95%   BMI 27.18 kg/m     Wt Readings from Last 3 Encounters:  01/15/21 189 lb 6.4 oz (85.9 kg)  07/14/20 189 lb (85.7 kg)  01/09/20 181 lb (82.1 kg)     GEN:  Well nourished, well developed in no acute distress HEENT: Normal NECK: No JVD; No carotid bruits LYMPHATICS: No lymphadenopathy CARDIAC: RRR, no murmurs, rubs, gallops RESPIRATORY:  Clear to auscultation without rales, wheezing or rhonchi  ABDOMEN: Soft, non-tender, non-distended MUSCULOSKELETAL:  No edema; No deformity  SKIN: Warm and dry NEUROLOGIC:  Alert and oriented x 3 PSYCHIATRIC:  Normal affect    Signed, Shirlee More, MD  01/15/2021 2:14 PM    [facility]

## 2021-01-15 ENCOUNTER — Other Ambulatory Visit: Payer: Self-pay

## 2021-01-15 ENCOUNTER — Ambulatory Visit: Payer: PPO | Admitting: Cardiology

## 2021-01-15 ENCOUNTER — Encounter: Payer: Self-pay | Admitting: Cardiology

## 2021-01-15 VITALS — BP 124/74 | HR 86 | Ht 70.0 in | Wt 189.4 lb

## 2021-01-15 DIAGNOSIS — I10 Essential (primary) hypertension: Secondary | ICD-10-CM | POA: Diagnosis not present

## 2021-01-15 DIAGNOSIS — I25118 Atherosclerotic heart disease of native coronary artery with other forms of angina pectoris: Secondary | ICD-10-CM | POA: Diagnosis not present

## 2021-01-15 DIAGNOSIS — I700 Atherosclerosis of aorta: Secondary | ICD-10-CM | POA: Diagnosis not present

## 2021-01-15 DIAGNOSIS — E782 Mixed hyperlipidemia: Secondary | ICD-10-CM | POA: Diagnosis not present

## 2021-01-15 DIAGNOSIS — I6521 Occlusion and stenosis of right carotid artery: Secondary | ICD-10-CM

## 2021-01-15 MED ORDER — ROSUVASTATIN CALCIUM 40 MG PO TABS: ORAL_TABLET | ORAL | 3 refills | Status: DC

## 2021-01-15 NOTE — Patient Instructions (Signed)
Medication Instructions:  Your physician has recommended you make the following change in your medication:  START: Aspirin 81 mg take one tablet by mouth daily.  *If you need a refill on your cardiac medications before your next appointment, please call your pharmacy*   Lab Work: None If you have labs (blood work) drawn today and your tests are completely normal, you will receive your results only by: [name] MyChart Message (if you have MyChart) OR . A paper copy in the mail If you have any lab test that is abnormal or we need to change your treatment, we will call you to review the results.   Testing/Procedures: Your physician has requested that you have a carotid duplex. This test is an ultrasound of the carotid arteries in your neck. It looks at blood flow through these arteries that supply the brain with blood. Allow one hour for this exam. There are no restrictions or special instructions.    Follow-Up: At [facility], you and your health needs are our priority.  As part of our continuing mission to provide you with exceptional heart care, we have created designated Provider Care Teams.  These Care Teams include your primary Cardiologist (physician) and Advanced Practice Providers (APPs -  Physician Assistants and Nurse Practitioners) who all work together to provide you with the care you need, when you need it.  We recommend signing up for the patient portal called "MyChart".  Sign up information is provided on this After Visit Summary.  MyChart is used to connect with patients for Virtual Visits (Telemedicine).  Patients are able to view lab/test results, encounter notes, upcoming appointments, etc.  Non-urgent messages can be sent to your provider as well.   To learn more about what you can do with MyChart, go to [email or web address].    Your next appointment:   1 year(s)  The format for your next appointment:   In Person  Provider:   Shirlee More, MD   Other  Instructions

## 2021-01-26 ENCOUNTER — Ambulatory Visit (INDEPENDENT_AMBULATORY_CARE_PROVIDER_SITE_OTHER): Payer: PPO | Admitting: Adult Health

## 2021-01-26 ENCOUNTER — Other Ambulatory Visit: Payer: Self-pay

## 2021-01-26 ENCOUNTER — Encounter: Payer: Self-pay | Admitting: Adult Health

## 2021-01-26 DIAGNOSIS — G4700 Insomnia, unspecified: Secondary | ICD-10-CM | POA: Diagnosis not present

## 2021-01-26 DIAGNOSIS — F331 Major depressive disorder, recurrent, moderate: Secondary | ICD-10-CM

## 2021-01-26 DIAGNOSIS — F411 Generalized anxiety disorder: Secondary | ICD-10-CM

## 2021-01-26 DIAGNOSIS — F410 Panic disorder [episodic paroxysmal anxiety] without agoraphobia: Secondary | ICD-10-CM | POA: Diagnosis not present

## 2021-01-26 MED ORDER — RISPERIDONE 1 MG PO TABS: ORAL_TABLET | ORAL | 5 refills | Status: DC

## 2021-01-26 MED ORDER — DULOXETINE HCL 60 MG PO CPEP: 60.0000 mg | ORAL_CAPSULE | Freq: Every day | ORAL | 5 refills | Status: DC

## 2021-01-26 MED ORDER — ALPRAZOLAM 0.5 MG PO TABS: 0.5000 mg | ORAL_TABLET | Freq: Two times a day (BID) | ORAL | 2 refills | Status: DC

## 2021-01-26 NOTE — Progress Notes (Signed)
Edward Cuevas [id number] 1956/02/23 65 y.o.  Subjective:   Patient ID:  Edward Cuevas is a 65 y.o. (DOB Nov 28, 1955) male.  Chief Complaint: No chief complaint on file.   HPI Edward Cuevas presents to the office today for follow-up of MDD, anxiety, panic attacks, and insomnia.   Describes mood today as "ok". Pleasant. Mood symptoms - denies depression, anxiety, and irritability. Denies panic attacks. Denies worry and ruminations. Stating "I'm doing great". Feels like current medications work well for him. Improved interest and motivation. Taking medications as prescribed.  Energy levels stable. Active, does not have a regular exercise routine with physical disabilities.  Enjoys some usual interests and activities. Divorced. Lives with ex wife. Has a 29 year old son locally and another son in [place]. Has a sister in [place]. Spending time with family. Appetite adequate. Weight stable - 180 pounds. Sleeps well most nights. Averages 5 to 6 hours. Focus and concentration improved. Completing tasks. Managing aspects of household. Receiving disability benefits/retirement. 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 Denies SI or HI.  Denies AH or VH.  Previous medication trials: Cymbalta, Wellbutrin     GAD-7   Flowsheet Row Office Visit from 07/14/2020 in [place] Office Visit from 01/09/2020 in [place] Office Visit from 11/28/2019 in [place] Visit from 07/11/2019 in [place] Visit from 05/16/2019 in [place]  Total GAD-7 Score 0 6 3 0 1    [place] Office Visit from 07/14/2020 in [place] Office Visit from 01/09/2020 in [place] Office Visit from 11/28/2019 in [place] Office Visit from 07/11/2019 in [place] from 05/15/2019 in [place]  PHQ-2 Total Score 0 4 6 2 3   PHQ-9 Total Score 4 10 17 5 7        Review of Systems:  Review of Systems  Musculoskeletal: Negative for gait problem.  Neurological: Negative for tremors.  Psychiatric/Behavioral:       Please refer to HPI    Medications: I have reviewed the patient's current medications.  Current Outpatient Medications  Medication Sig Dispense Refill  . acetaminophen (TYLENOL) 650 MG CR tablet Take 650 mg by mouth 2 (two) times daily.    [name] ALPRAZolam (XANAX) 0.5 MG tablet Take 1 tablet (0.5 mg total) by mouth 2 (two) times daily. 60 tablet 2  . aspirin EC 81 MG tablet Take 1 tablet (81 mg total) by mouth daily. 90 tablet 3  . DULoxetine (CYMBALTA) 60 MG capsule Take 1 capsule (60 mg total) by mouth daily. 30 capsule 5  . Fluticasone-Umeclidin-Vilant 200-62.5-25 MCG/INH AEPB Inhale 1 puff into the lungs daily. 60 each 5  . hydrocortisone (ANUSOL-HC) 2.5 % rectal cream Place 1 application rectally 2 (two) times daily. 60 g 1  . MYRBETRIQ 50 MG TB24 tablet Take 1 tablet (50 mg total) by mouth daily. 30 tablet 11  . nitroGLYCERIN (NITROSTAT) 0.4 MG SL tablet Place 1 tablet (0.4 mg total) under the tongue every 5 (five) minutes as needed for chest pain. 25 tablet 3  . risperiDONE (RISPERDAL) 1 MG tablet TAKE ONE TABLET AT BEDTIME 30 tablet 5  . rosuvastatin (CRESTOR) 40 MG tablet TAKE 1 TABLET(40  MG) BY MOUTH DAILY 90 tablet 3  . tamsulosin (FLOMAX) 0.4 MG CAPS capsule Take 1 capsule (0.4 mg total) by mouth at bedtime. 30 capsule 11  . TRELEGY ELLIPTA 100-62.5-25 MCG/INH AEPB INHALE 1 PUFF INTO THE LUNGS DAILY 60 each 5   No current facility-administered medications for this visit.    Medication Side Effects: None  Allergies:  Allergies  Allergen Reactions  . Abilify [Aripiprazole]     Feel  weird.   . Penicillins Itching and Rash    Has patient had a PCN reaction causing immediate rash, facial/tongue/throat swelling, SOB or lightheadedness with hypotension: Yes Has patient had a PCN reaction causing severe rash involving mucus membranes or skin necrosis: No Has patient had a PCN reaction that required hospitalization No Has patient had a PCN reaction occurring within the last 10 years: No If all of the above answers are "NO", then may proceed with Cephalosporin use.     Past Medical History:  Diagnosis Date  . Anxiety   . Arthritis   . Cancer ([place])    skin  . COPD (chronic obstructive pulmonary disease) ([place])    per pt ?  [name] Depression   . Hepatitis    A,B,C  . Hyperlipidemia   . Hypertension   . Prediabetes   . Vitamin D deficiency     Family History  Problem Relation Age of Onset  . Diabetes Mother   . Heart disease Father   . Heart attack Father   . Heart disease Maternal Grandmother     Social History   Socioeconomic History  . Marital status: Divorced    Spouse name: Not on file  . Number of children: 2  . Years of education: 72  . Highest education level: 11th grade  Occupational History  . Occupation: superintendant    Comment: retired  Tobacco Use  . Smoking status: Current Every Day Smoker    Packs/day: 1.00    Years: 30.00    Pack years: 30.00    Types: Cigarettes  . Smokeless tobacco: Never Used  Vaping Use  . Vaping Use: Never used  Substance and Sexual Activity  . Alcohol use: Yes    Alcohol/week: 3.0 standard drinks    Types: 3 Cans of beer per week    Comment: daily  . Drug use: No  . Sexual activity: Not Currently  Other Topics Concern  . Not on file  Social History Narrative  . Not on file   Social Determinants of Health   Financial Resource Strain: Not on file  Food Insecurity: Not on file  Transportation Needs: Not on file  Physical Activity: Not on file  Stress: Not on file  Social Connections: Not on file   Intimate Partner Violence: Not on file    Past Medical History, Surgical history, Social history, and Family history were reviewed and updated as appropriate.   Please see review of systems for further details on the patient's review from today.   Objective:   Physical Exam:  There were no vitals taken for this visit.  Physical Exam Constitutional:      General: He is not in acute distress. Musculoskeletal:        General: No deformity.  Neurological:     Mental Status: He is alert and oriented to person, place, and time.     Coordination: Coordination normal.  Psychiatric:        Attention and Perception: Attention and perception normal. He does not perceive auditory or  visual hallucinations.        Mood and Affect: Mood normal. Mood is not anxious or depressed. Affect is not labile, blunt, angry or inappropriate.        Speech: Speech normal.        Behavior: Behavior normal.        Thought Content: Thought content normal. Thought content is not paranoid or delusional. Thought content does not include homicidal or suicidal ideation. Thought content does not include homicidal or suicidal plan.        Cognition and Memory: Cognition and memory normal.        Judgment: Judgment normal.     Comments: Insight intact     Lab Review:     Component Value Date/Time   NA 137 07/29/2020 1305   NA 136 07/16/2019 1151   K 4.2 07/29/2020 1305   CL 102 07/29/2020 1305   CO2 30 07/29/2020 1305   GLUCOSE 159 (H) 07/29/2020 1305   BUN 10 07/29/2020 1305   BUN 10 07/16/2019 1151   CREATININE 0.86 07/29/2020 1305   CALCIUM 9.3 07/29/2020 1305   PROT 7.4 07/29/2020 1305   PROT 7.4 07/29/2020 1305   PROT 7.2 07/16/2019 1151   ALBUMIN 4.6 07/16/2019 1151   AST 63 (H) 07/29/2020 1305   AST 63 (H) 07/29/2020 1305   ALT 87 (H) 07/29/2020 1305   ALT 87 (H) 07/29/2020 1305   ALKPHOS 60 07/16/2019 1151   BILITOT 0.6 07/29/2020 1305   BILITOT 0.6 07/29/2020 1305   BILITOT 0.2 07/16/2019  1151   GFRNONAA 91 07/29/2020 1305   GFRAA 105 07/29/2020 1305       Component Value Date/Time   WBC 5.8 04/10/2019 0811   RBC 5.33 04/10/2019 0811   HGB 17.1 04/10/2019 0811   HCT 48.7 04/10/2019 0811   PLT 198 04/10/2019 0811   MCV 91.4 04/10/2019 0811   MCH 32.1 04/10/2019 0811   MCHC 35.1 04/10/2019 0811   RDW 12.1 04/10/2019 0811   LYMPHSABS 2,627 04/10/2019 0811   MONOABS 0.4 09/19/2014 1716   EOSABS 81 04/10/2019 0811   BASOSABS 58 04/10/2019 0811    No results found for: POCLITH, LITHIUM   No results found for: PHENYTOIN, PHENOBARB, VALPROATE, CBMZ   .res Assessment: Plan:     Plan:  PDMP reviewed  1. Cymbalta 60mg  daily   2. Risperdal 1mg  at hs  3. Xanax 0.5mg  at bedtime to BID  Read and reviewed note with patient for accuracy.   RTC 4 months  Patient advised to contact office with any questions, adverse effects, or acute worsening in signs and symptoms.  Discussed potential metabolic side effects associated with atypical antipsychotics, as well as potential risk for movement side effects. Advised pt to contact office if movement side effects occur.    Diagnoses and all orders for this visit:  Moderate episode of recurrent major depressive disorder (HCC) -     DULoxetine (CYMBALTA) 60 MG capsule; Take 1 capsule (60 mg total) by mouth daily.  Generalized anxiety disorder -     DULoxetine (CYMBALTA) 60 MG capsule; Take 1 capsule (60 mg total) by mouth daily.  Insomnia, unspecified type -     risperiDONE (RISPERDAL) 1 MG tablet; TAKE ONE TABLET AT BEDTIME  Panic attacks -     ALPRAZolam (XANAX) 0.5 MG tablet; Take 1 tablet (0.5 mg total) by mouth 2 (two) times daily.     Please see After Visit Summary for patient specific instructions.  No future  appointments.  No orders of the defined types were placed in this encounter.   -------------------------------

## 2021-02-11 ENCOUNTER — Telehealth: Payer: Self-pay

## 2021-02-11 DIAGNOSIS — Z1211 Encounter for screening for malignant neoplasm of colon: Secondary | ICD-10-CM

## 2021-02-11 DIAGNOSIS — C801 Malignant (primary) neoplasm, unspecified: Secondary | ICD-10-CM

## 2021-02-11 NOTE — Telephone Encounter (Signed)
Pt called requesting Cologuard instead of having a colonoscopy. Order pended.

## 2021-02-11 NOTE — Telephone Encounter (Signed)
Done

## 2021-02-20 ENCOUNTER — Telehealth: Payer: Self-pay | Admitting: Neurology

## 2021-02-20 DIAGNOSIS — Z8619 Personal history of other infectious and parasitic diseases: Secondary | ICD-10-CM

## 2021-02-20 DIAGNOSIS — B182 Chronic viral hepatitis C: Secondary | ICD-10-CM

## 2021-02-20 NOTE — Telephone Encounter (Signed)
Patient left vm wanting to know where he can get hepatitis labs in [place]. No other information.   We did order hepatic panel on patient to have repeated, unsure if this is what he is asking about. I called [facility] letting patient know if this is what he needs he can have done at [facility] off [address]. If this isn't what he is asking he will need to call back and leave for information about what he needs.

## 2021-02-20 NOTE — Telephone Encounter (Signed)
Patient called back and states he was treated for Hepatitis in 2013 and wants to be retested for this? He had a horrible experience where he was treated and wants to go to a different clinic in [place] if possible.

## 2021-02-23 ENCOUNTER — Other Ambulatory Visit: Payer: Self-pay | Admitting: Physician Assistant

## 2021-02-23 DIAGNOSIS — R748 Abnormal levels of other serum enzymes: Secondary | ICD-10-CM

## 2021-02-23 DIAGNOSIS — Z79899 Other long term (current) drug therapy: Secondary | ICD-10-CM

## 2021-02-23 DIAGNOSIS — Z1212 Encounter for screening for malignant neoplasm of rectum: Secondary | ICD-10-CM | POA: Diagnosis not present

## 2021-02-23 DIAGNOSIS — Z1211 Encounter for screening for malignant neoplasm of colon: Secondary | ICD-10-CM | POA: Diagnosis not present

## 2021-02-23 DIAGNOSIS — R7301 Impaired fasting glucose: Secondary | ICD-10-CM

## 2021-02-23 NOTE — Telephone Encounter (Signed)
I ordered hep C labs. Ok to go to lab to have drawn.

## 2021-02-23 NOTE — Addendum Note (Signed)
Addended by: Donella Stade on: 02/23/2021 05:46 AM   Modules accepted: Orders

## 2021-02-24 NOTE — Telephone Encounter (Signed)
Patient made aware.

## 2021-02-25 DIAGNOSIS — R7301 Impaired fasting glucose: Secondary | ICD-10-CM | POA: Diagnosis not present

## 2021-02-25 DIAGNOSIS — Z8619 Personal history of other infectious and parasitic diseases: Secondary | ICD-10-CM | POA: Diagnosis not present

## 2021-02-25 DIAGNOSIS — R748 Abnormal levels of other serum enzymes: Secondary | ICD-10-CM | POA: Diagnosis not present

## 2021-02-25 DIAGNOSIS — Z79899 Other long term (current) drug therapy: Secondary | ICD-10-CM | POA: Diagnosis not present

## 2021-02-25 LAB — COLOGUARD: Cologuard: POSITIVE — AB

## 2021-02-26 NOTE — Progress Notes (Signed)
Hep C RNA was supposed to be added too. I thought I did this? Can we please add?  a1C normal range.  No alarming concerns.

## 2021-03-01 LAB — HCV RNA,QUANTITATIVE REAL TIME PCR
HCV Quantitative Log: 6.42 Log IU/mL — ABNORMAL HIGH
HCV RNA, PCR, QN: 2640000 IU/mL — ABNORMAL HIGH

## 2021-03-01 LAB — HEPATITIS C RNA QUANTITATIVE
HCV Quantitative Log: 6.75 log IU/mL — ABNORMAL HIGH
HCV RNA, PCR, QN: 5570000 IU/mL — ABNORMAL HIGH

## 2021-03-01 LAB — HEPATITIS PANEL, ACUTE
Hep A IgM: NONREACTIVE
Hep B C IgM: NONREACTIVE
Hepatitis B Surface Ag: NONREACTIVE
Hepatitis C Ab: REACTIVE — AB
SIGNAL TO CUT-OFF: 24 — ABNORMAL HIGH (ref <1.00)

## 2021-03-02 NOTE — Addendum Note (Signed)
Addended by: Donella Stade on: 03/02/2021 06:19 AM   Modules accepted: Orders

## 2021-03-02 NOTE — Telephone Encounter (Signed)
Viral Hep C load is higher than last tested. Will make new referral.

## 2021-03-03 LAB — ACUTE HEP PANEL AND HEP B SURFACE AB
HEPATITIS C ANTIBODY REFILL$(REFL): REACTIVE — AB
Hep A IgM: NONREACTIVE
Hep B C IgM: NONREACTIVE
Hepatitis B Surface Ag: NONREACTIVE
SIGNAL TO CUT-OFF: 24 — ABNORMAL HIGH (ref <1.00)

## 2021-03-03 LAB — BASIC METABOLIC PANEL WITH GFR
BUN: 11 mg/dL (ref 7–25)
CO2: 28 mmol/L (ref 20–32)
Calcium: 9.5 mg/dL (ref 8.6–10.3)
Chloride: 100 mmol/L (ref 98–110)
Creat: 0.95 mg/dL (ref 0.70–1.25)
GFR, Est African American: 96 mL/min/{1.73_m2} (ref >=60)
GFR, Est Non African American: 83 mL/min/{1.73_m2} (ref >=60)
Glucose, Bld: 107 mg/dL — ABNORMAL HIGH (ref 65–99)
Potassium: 4.7 mmol/L (ref 3.5–5.3)
Sodium: 138 mmol/L (ref 135–146)

## 2021-03-03 LAB — HEPATITIS C RNA QUANTITATIVE
HCV Quantitative Log: 6.63 log IU/mL — ABNORMAL HIGH
HCV RNA, PCR, QN: 4300000 IU/mL — ABNORMAL HIGH

## 2021-03-03 LAB — HEPATIC FUNCTION PANEL
AG Ratio: 1.7 (calc) (ref 1.0–2.5)
ALT: 78 U/L — ABNORMAL HIGH (ref 9–46)
AST: 55 U/L — ABNORMAL HIGH (ref 10–35)
Albumin: 4.6 g/dL (ref 3.6–5.1)
Alkaline phosphatase (APISO): 51 U/L (ref 35–144)
Bilirubin, Direct: 0.2 mg/dL (ref 0.0–0.2)
Globulin: 2.7 g/dL (calc) (ref 1.9–3.7)
Indirect Bilirubin: 0.3 mg/dL (calc) (ref 0.2–1.2)
Total Bilirubin: 0.5 mg/dL (ref 0.2–1.2)
Total Protein: 7.3 g/dL (ref 6.1–8.1)

## 2021-03-03 LAB — HEMOGLOBIN A1C
Hgb A1c MFr Bld: 5.6 % of total Hgb (ref <5.7)
Mean Plasma Glucose: 114 mg/dL
eAG (mmol/L): 6.3 mmol/L

## 2021-03-03 LAB — HCV RNA,QUANTITATIVE REAL TIME PCR

## 2021-03-03 LAB — REFLEX TIQ

## 2021-03-09 ENCOUNTER — Ambulatory Visit (INDEPENDENT_AMBULATORY_CARE_PROVIDER_SITE_OTHER): Payer: PPO | Admitting: Family

## 2021-03-09 ENCOUNTER — Telehealth: Payer: Self-pay

## 2021-03-09 ENCOUNTER — Other Ambulatory Visit: Payer: Self-pay

## 2021-03-09 ENCOUNTER — Other Ambulatory Visit (HOSPITAL_COMMUNITY): Payer: Self-pay

## 2021-03-09 ENCOUNTER — Encounter: Payer: Self-pay | Admitting: Family

## 2021-03-09 VITALS — BP 114/71 | HR 76 | Temp 97.9°F | Wt 187.0 lb

## 2021-03-09 DIAGNOSIS — B182 Chronic viral hepatitis C: Secondary | ICD-10-CM | POA: Insufficient documentation

## 2021-03-09 HISTORY — DX: Chronic viral hepatitis C: B18.2

## 2021-03-09 NOTE — Assessment & Plan Note (Signed)
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 Previously treated with unknown oral regimen with significant adverse side effects. Unlikely to be any current medications. Suspect this is likely treatment failure as opposed to re-infection. Will check lab work include Hepatitis C genotype, liver function, fibrosis score, NS5a resistance, HIV status and Hepatitis B status. Discussed the risks and benefits of treatment. Plan for follow pending lab work and 1 month after initiation of medication.

## 2021-03-09 NOTE — Patient Instructions (Signed)
Nice to meet you.  We will check your lab work today and let you know the results.   Plan for follow up 1 month after starting medication.  Limit acetaminophen (Tylenol) usage to no more than 2 grams (2,000 mg) per day.  Avoid alcohol.  Do not share toothbrushes or razors.  Practice safe sex to protect against transmission as well as sexually transmitted disease.    Hepatitis C Hepatitis C is a viral infection of the liver. It can lead to scarring of the liver (cirrhosis), liver failure, or liver cancer. Hepatitis C may go undetected for months or years because people with the infection may not have symptoms, or they may have only mild symptoms. What are the causes? This condition is caused by the hepatitis C virus (HCV). The virus can spread from person to person (is contagious) through:  Blood.  Childbirth. A woman who has hepatitis C can pass it to her baby during birth.  Bodily fluids, such as breast milk, tears, semen, vaginal fluids, and saliva.  Blood transfusions or organ transplants done in the [place] before 1992.  What increases the risk? The following factors may make you more likely to develop this condition:  Having contact with unclean (contaminated) needles or syringes. This may result from: ? Acupuncture. ? Tattoing. ? Body piercing. ? Injecting drugs.  Having unprotected sex with someone who is infected.  Needing treatment to filter your blood (kidney dialysis).  Having HIV (human immunodeficiency virus) or AIDS (acquired immunodeficiency syndrome).  Working in a job that involves contact with blood or bodily fluids, such as health care.  What are the signs or symptoms? Symptoms of this condition include:  Fatigue.  Loss of appetite.  Nausea.  Vomiting.  Abdominal pain.  Dark yellow urine.  Yellowish skin and eyes (jaundice).  Itchy skin.  Clay-colored bowel movements.  Joint pain.  Bleeding and bruising easily.  Fluid  building up in your stomach (ascites).  In some cases, you may not have any symptoms. How is this diagnosed? This condition is diagnosed with:  Blood tests.  Other tests to check how well your liver is functioning. They may include: ? Magnetic resonance elastography (MRE). This imaging test uses MRIs and sound waves to measure liver stiffness. ? Transient elastography. This imaging test uses ultrasounds to measure liver stiffness. ? Liver biopsy. This test requires taking a small tissue sample from your liver to examine it under a microscope.  How is this treated? Your health care provider may perform noninvasive tests or a liver biopsy to help decide the best course of treatment. Treatment may include:  Antiviral medicines and other medicines.  Follow-up treatments every 6-12 months for infections or other liver conditions.  Receiving a donated liver (liver transplant).  Follow these instructions at home: Medicines  Take over-the-counter and prescription medicines only as told by your health care provider.  Take your antiviral medicine as told by your health care provider. Do not stop taking the antiviral even if you start to feel better.  Do not take any medicines unless approved by your health care provider, including over-the-counter medicines and birth control pills. Activity  Rest as needed.  Do not have sex unless approved by your health care provider.  Ask your health care provider when you may return to school or work. Eating and drinking  Eat a balanced diet with plenty of fruits and vegetables, whole grains, and lowfat (lean) meats or non-meat proteins (such as beans or tofu).  Drink  enough fluids to keep your urine clear or pale yellow.  Do not drink alcohol. General instructions  Do not share toothbrushes, nail clippers, or razors.  Wash your hands frequently with soap and water. If soap and water are not available, use hand sanitizer.  Cover any cuts or  open sores on your skin to prevent spreading the virus.  Keep all follow-up visits as told by your health care provider. This is important. You may need follow-up visits every 6-12 months. How is this prevented? There is no vaccine for hepatitis C. The only way to prevent the disease is to reduce the risk of exposure to the virus. Make sure you:  Wash your hands frequently with soap and water. If soap and water are not available, use hand sanitizer.  Do not share needles or syringes.  Practice safe sex and use condoms.  Avoid handling blood or bodily fluids without gloves or other protection.  Avoid getting tattoos or piercings in shops or other locations that are not clean.  Contact a health care provider if:  You have a fever.  You develop abdominal pain.  You pass dark urine.  You pass clay-colored stools.  You develop joint pain. Get help right away if:  You have increasing fatigue or weakness.  You lose your appetite.  You cannot eat or drink without vomiting.  You develop jaundice or your jaundice gets worse.  You bruise or bleed easily. Summary  Hepatitis C is a viral infection of the liver. It can lead to scarring of the liver (cirrhosis), liver failure, or liver cancer.  The hepatitis C virus (HCV) causes this condition. The virus can pass from person to person (is contagious).  You should not take any medicines unless approved by your health care provider. This includes over-the-counter medicines and birth control pills. This information is not intended to replace advice given to you by your health care provider. Make sure you discuss any questions you have with your health care provider. Document Released: 10/01/2000 Document Revised: 11/09/2016 Document Reviewed: 11/09/2016 Elsevier Interactive Patient Education  [employer].

## 2021-03-09 NOTE — Progress Notes (Signed)
Subjective:    Patient ID: Edward Cuevas, male    DOB: 09/17/1956, 65 y.o.   MRN: [id number]  Chief Complaint  Patient presents with  . New Patient (Initial Visit)    Hep C    HPI:  Edward Cuevas is a 65 y.o. male with previous medical history of coronary artery disease, COPD, hypertension, degenerative joint disease, generalized anxiety disorder, and adrenal adenoma presenting today for evaluation and treatment of hepatitis C.  [name] was initially diagnosed with Hepatitis C around 2012 during routine screening by his Primary Care Provider. He was referred for treatment and recalls taking oral medications that he went through 2 treatments and had adverse side effects of medication that caused an inability to walk, talk, fall and resulted in him losing his job. Does not believe that he finished treatment. Recent lab work completed on 02/25/21 with Hepatitis C RNA level of 4.3 million. Risk factor for Hepatitis C includes history of IV drug use and age being born between [birth year] and 46. No personal or family history of liver diease. No current symptoms and denies abdominal pain, nausea, vomiting, scleral icterus or jaundice. No current recreational or illicit drug use, tobacco use, or alcohol consumption.    Allergies  Allergen Reactions  . Abilify [Aripiprazole]     Feel weird.   . Penicillins Itching and Rash    Has patient had a PCN reaction causing immediate rash, facial/tongue/throat swelling, SOB or lightheadedness with hypotension: Yes Has patient had a PCN reaction causing severe rash involving mucus membranes or skin necrosis: No Has patient had a PCN reaction that required hospitalization No Has patient had a PCN reaction occurring within the last 10 years: No If all of the above answers are "NO", then may proceed with Cephalosporin use.       Outpatient Medications Prior to Visit  Medication Sig Dispense Refill  . acetaminophen (TYLENOL) 650 MG CR tablet Take 650 mg by  mouth 2 (two) times daily.    [name] ALPRAZolam (XANAX) 0.5 MG tablet Take 1 tablet (0.5 mg total) by mouth 2 (two) times daily. 60 tablet 2  . aspirin EC 81 MG tablet Take 1 tablet (81 mg total) by mouth daily. 90 tablet 3  . DULoxetine (CYMBALTA) 60 MG capsule Take 1 capsule (60 mg total) by mouth daily. 30 capsule 5  . Fluticasone-Umeclidin-Vilant 200-62.5-25 MCG/INH AEPB Inhale 1 puff into the lungs daily. 60 each 5  . hydrocortisone (ANUSOL-HC) 2.5 % rectal cream Place 1 application rectally 2 (two) times daily. 60 g 1  . MYRBETRIQ 50 MG TB24 tablet Take 1 tablet (50 mg total) by mouth daily. 30 tablet 11  . risperiDONE (RISPERDAL) 1 MG tablet TAKE ONE TABLET AT BEDTIME 30 tablet 5  . rosuvastatin (CRESTOR) 40 MG tablet TAKE 1 TABLET(40 MG) BY MOUTH DAILY 90 tablet 3  . tamsulosin (FLOMAX) 0.4 MG CAPS capsule Take 1 capsule (0.4 mg total) by mouth at bedtime. 30 capsule 11  . TRELEGY ELLIPTA 100-62.5-25 MCG/INH AEPB INHALE 1 PUFF INTO THE LUNGS DAILY 60 each 5  . nitroGLYCERIN (NITROSTAT) 0.4 MG SL tablet Place 1 tablet (0.4 mg total) under the tongue every 5 (five) minutes as needed for chest pain. 25 tablet 3   No facility-administered medications prior to visit.     Past Medical History:  Diagnosis Date  . Anxiety   . Arthritis   . Cancer ([place])    skin  . COPD (chronic obstructive pulmonary disease) ([place])  per pt ?  [name] Depression   . Hepatitis    A,B,C  . Hyperlipidemia   . Hypertension   . Prediabetes   . Vitamin D deficiency       Past Surgical History:  Procedure Laterality Date  . ELBOW SURGERY Right 1986  . [place]  . HERNIA REPAIR Left 1996  . INGUINAL HERNIA REPAIR Right 12/30/2015   Procedure: RIGHT RIGHT INGUINAL HERNIA REPAIR;  Surgeon: Ralene Ok, MD;  Location: ;  Service: General;  Laterality: Right;  . INSERTION OF MESH Right 12/30/2015   Procedure: INSERTION OF MESH;  Surgeon: Ralene Ok, MD;  Location: [place];   Service: General;  Laterality: Right;  . SHOULDER SURGERY Left 2007      Family History  Problem Relation Age of Onset  . Diabetes Mother   . Heart disease Father   . Heart attack Father   . Heart disease Maternal Grandmother       Social History   Socioeconomic History  . Marital status: Divorced    Spouse name: Not on file  . Number of children: 2  . Years of education: 48  . Highest education level: 11th grade  Occupational History  . Occupation: superintendant    Comment: retired  Tobacco Use  . Smoking status: Current Every Day Smoker    Packs/day: 1.00    Years: 30.00    Pack years: 30.00    Types: Cigarettes  . Smokeless tobacco: Never Used  Vaping Use  . Vaping Use: Never used  Substance and Sexual Activity  . Alcohol use: Yes    Alcohol/week: 3.0 standard drinks    Types: 3 Cans of beer per week    Comment: daily  . Drug use: No  . Sexual activity: Not Currently  Other Topics Concern  . Not on file  Social History Narrative  . Not on file   Social Determinants of Health   Financial Resource Strain: Not on file  Food Insecurity: Not on file  Transportation Needs: Not on file  Physical Activity: Not on file  Stress: Not on file  Social Connections: Not on file  Intimate Partner Violence: Not on file      Review of Systems  Constitutional: Negative for chills, diaphoresis, fatigue and fever.  Respiratory: Negative for cough, chest tightness, shortness of breath and wheezing.   Cardiovascular: Negative for chest pain.  Gastrointestinal: Negative for abdominal distention, abdominal pain, constipation, diarrhea, nausea and vomiting.  Neurological: Negative for weakness and headaches.  Hematological: Does not bruise/bleed easily.       Objective:    BP 114/71   Pulse 76   Temp 97.9 F (36.6 C) (Oral)   Wt 187 lb (84.8 kg)   BMI 26.83 kg/m  Nursing note and vital signs reviewed.  Physical Exam Constitutional:      General: He is not  in acute distress.    Appearance: He is well-developed.  Cardiovascular:     Rate and Rhythm: Normal rate and regular rhythm.     Heart sounds: Normal heart sounds. No murmur heard. No friction rub. No gallop.   Pulmonary:     Effort: Pulmonary effort is normal. No respiratory distress.     Breath sounds: Normal breath sounds. No wheezing or rales.  Chest:     Chest wall: No tenderness.  Abdominal:     General: Bowel sounds are normal. There is no distension.     Palpations: Abdomen is soft.  There is no mass.     Tenderness: There is no abdominal tenderness. There is no guarding or rebound.  Skin:    General: Skin is warm and dry.  Neurological:     Mental Status: He is alert and oriented to person, place, and time.  Psychiatric:        Behavior: Behavior normal.        Thought Content: Thought content normal.        Judgment: Judgment normal.         Assessment & Plan:   Patient Active Problem List   Diagnosis Date Noted  . Chronic hepatitis C with hepatic coma ([place]) 03/09/2021  . Hyperlipidemia   . Hepatitis   . Depression   . COPD (chronic obstructive pulmonary disease) ([place])   . Cancer ([place])   . Arthritis   . Anxiety   . OAB (overactive bladder) 07/12/2019  . Dyspnea on exertion 05/31/2019  . Centrilobular emphysema ([place]) 05/16/2019  . Moderate episode of recurrent major depressive disorder ([place]) 05/16/2019  . Aortic atherosclerosis ([place]) 05/16/2019  . Coronary artery disease due to calcified coronary lesion 05/16/2019  . Pulmonary nodules 05/16/2019  . Adrenal adenoma 04/17/2019  . Carotid stenosis, right 04/17/2019  . Abdominal distension (gaseous) 04/11/2019  . Right carotid bruit 04/09/2019  . Right lower quadrant abdominal pain 04/09/2019  . Current smoker 04/09/2019  . Lumbar radiculopathy 01/18/2017  . Chronic obstructive pulmonary disease ([place]) 12/29/2016  . Fatty liver 10/01/2016  . BPH without obstruction/lower urinary tract symptoms 11/25/2015   . Chronic pain of multiple joints 11/25/2015  . Actinic keratosis 06/27/2015  . GAD (generalized anxiety disorder) 11/01/2014  . History of hepatitis C 10/08/2014  . Right inguinal hernia 10/08/2014  . Panic attacks 10/04/2014  . DJD 10/23/2013  . DDD (degenerative disc disease), lumbosacral 10/23/2013  . Mixed hyperlipidemia   . Hypertension   . Prediabetes   . Vitamin D deficiency      Problem List Items Addressed This Visit      Digestive   Chronic hepatitis C with hepatic coma ([place]) - Primary    Mr. Knisley is a 65 y/o caucasian male with Chronic Hepatitis C with risk factors of history of IV drug use and age. Previously treated with unknown oral regimen with significant adverse side effects. Unlikely to be any current medications. Suspect this is likely treatment failure as opposed to re-infection. Will check lab work include Hepatitis C genotype, liver function, fibrosis score, NS5a resistance, HIV status and Hepatitis B status. Discussed the risks and benefits of treatment. Plan for follow pending lab work and 1 month after initiation of medication.       Relevant Orders   Hepatitis C genotype   Liver Fibrosis, FibroTest-ActiTest   Protime-INR   HCV Viral RNA Gen3 NS5a Drug Resist- ([facility])   Hepatitis B surface antibody,qualitative   Hepatitis B surface antigen   HIV Antibody (routine testing w rflx)   CBC   Hepatic function panel       I am having [place] Citizen maintain his acetaminophen, aspirin EC, nitroGLYCERIN, Fluticasone-Umeclidin-Vilant, Myrbetriq, hydrocortisone, tamsulosin, Trelegy Ellipta, rosuvastatin, DULoxetine, risperiDONE, and ALPRAZolam.    Follow-up: Pending blood work and 1 month after start of medication.     Terri Piedra, MSN, FNP-C Nurse Practitioner [facility] for Infectious Disease [place] number: [phone]

## 2021-03-09 NOTE — Telephone Encounter (Signed)
RCID Patient Teacher, English as a foreign language completed.    The patient is insured through [employer].  Medication will need a PA   We will continue to follow to see if copay assistance is needed.  Ileene Patrick, [facility] Patient [facility] for Infectious Disease Phone: [phone] Fax:  [phone]

## 2021-03-10 ENCOUNTER — Telehealth: Payer: Self-pay | Admitting: Neurology

## 2021-03-10 ENCOUNTER — Encounter: Payer: Self-pay | Admitting: Physician Assistant

## 2021-03-10 ENCOUNTER — Telehealth: Payer: Self-pay | Admitting: Physician Assistant

## 2021-03-10 DIAGNOSIS — R195 Other fecal abnormalities: Secondary | ICD-10-CM | POA: Insufficient documentation

## 2021-03-10 HISTORY — DX: Other fecal abnormalities: R19.5

## 2021-03-10 NOTE — Telephone Encounter (Signed)
Pt had positive cologuard and now needs colonoscopy. Where would he like to go? Has he ever been anywhere for GI needs?

## 2021-03-10 NOTE — Telephone Encounter (Signed)
-----   Message from Donella Stade, [place] sent at 03/10/2021  6:40 AM EDT ----- Regarding: FW: Hepatitis C Treatment I did a brief look in careeverywhere and did not see visits for hep C treatment. Can you help me look for infectious disease provider?  ----- Message ----- From: Golden Circle, FNP Sent: 03/09/2021   5:02 PM EDT To: Donella Stade, PA-C Subject: Hepatitis C Treatment                          Hello Maddalynn Barnard. Thank you for the referral of Mr. Mcquitty. I wanted to see if you were aware of any documentation regarding his initial treatment for Hepatitis C as he described quite the amount of side effects. It is certainly possible he took a medication that is no longer available on the market as the side effects described don't generally happen with the current medications.  I appreciate any details that you may have as he was unable to provide me much.  Thanks for all you do!  Marya Amsler

## 2021-03-10 NOTE — Telephone Encounter (Signed)
I can not find infectious disease doctor, but did find GI note from [facility] dated 09/22/2016 that might be helpful. It is scanned under media. Relevant notes:   - Seen at that time for possible Hep C infection - Suspected time of acquisition is 40 years ago - history of blood transfusion and IV drug abuse  - previously treated with PEG-Intron and Ribavirin (stopped early due to intolerance) - was around 2004 when he took the old medication  He was to follow up in 6 weeks, there were then notes asking him to contact them to schedule follow up as it appears it was never done.   I searched Care Everywhere to see if patient was seen in [place], [place], [place], or [facility] systems and it could not locate anything.

## 2021-03-10 NOTE — Telephone Encounter (Signed)
Patient made aware. He would prefer referral to [facility]. He has also seen [facility] in the past. He is aware they are behind on referrals. Order placed. Cologuard abstracted.

## 2021-03-10 NOTE — Telephone Encounter (Signed)
This is perfect!

## 2021-03-10 NOTE — Telephone Encounter (Signed)
LMOM for patient to call back to let him know about positive Cologuard and make referral for Colonoscopy. Has seen [facility] in the past.

## 2021-03-11 NOTE — Telephone Encounter (Signed)
I changed referral to Urgent and I will keep a check on it to make sure it is scheduled - CF

## 2021-03-19 LAB — LIVER FIBROSIS, FIBROTEST-ACTITEST
ALT: 77 U/L — ABNORMAL HIGH (ref 9–46)
Alpha-2-Macroglobulin: 359 mg/dL — ABNORMAL HIGH (ref 106–279)
Apolipoprotein A1: 148 mg/dL (ref 94–176)
Bilirubin: 0.5 mg/dL (ref 0.2–1.2)
Fibrosis Score: 0.73
GGT: 135 U/L — ABNORMAL HIGH (ref 3–70)
Haptoglobin: 129 mg/dL (ref 43–212)
Necroinflammat ACT Score: 0.62
Reference ID: 3882808

## 2021-03-19 LAB — HEPATITIS C GENOTYPE: HCV Genotype: 2

## 2021-03-19 LAB — HEPATIC FUNCTION PANEL
AG Ratio: 1.5 (calc) (ref 1.0–2.5)
ALT: 78 U/L — ABNORMAL HIGH (ref 9–46)
AST: 54 U/L — ABNORMAL HIGH (ref 10–35)
Albumin: 4.8 g/dL (ref 3.6–5.1)
Alkaline phosphatase (APISO): 56 U/L (ref 35–144)
Bilirubin, Direct: 0.2 mg/dL (ref 0.0–0.2)
Globulin: 3.2 g/dL (calc) (ref 1.9–3.7)
Indirect Bilirubin: 0.3 mg/dL (calc) (ref 0.2–1.2)
Total Bilirubin: 0.5 mg/dL (ref 0.2–1.2)
Total Protein: 8 g/dL (ref 6.1–8.1)

## 2021-03-19 LAB — CBC
HCT: 54.1 % — ABNORMAL HIGH (ref 38.5–50.0)
Hemoglobin: 18.7 g/dL — ABNORMAL HIGH (ref 13.2–17.1)
MCH: 32.3 pg (ref 27.0–33.0)
MCHC: 34.6 g/dL (ref 32.0–36.0)
MCV: 93.4 fL (ref 80.0–100.0)
MPV: 10.6 fL (ref 7.5–12.5)
Platelets: 147 10*3/uL (ref 140–400)
RBC: 5.79 10*6/uL (ref 4.20–5.80)
RDW: 12.5 % (ref 11.0–15.0)
WBC: 4.3 10*3/uL (ref 3.8–10.8)

## 2021-03-19 LAB — HEPATITIS B SURFACE ANTIGEN: Hepatitis B Surface Ag: NONREACTIVE

## 2021-03-19 LAB — HEPATITIS B SURFACE ANTIBODY,QUALITATIVE: Hep B S Ab: REACTIVE — AB

## 2021-03-19 LAB — PROTIME-INR
INR: 1
Prothrombin Time: 9.9 s (ref 9.0–11.5)

## 2021-03-19 LAB — HCV VIRAL RNA GEN3 NS5A DRUG RESIST: HCV NS5a Subtype: NOT DETECTED

## 2021-03-19 LAB — HIV ANTIBODY (ROUTINE TESTING W REFLEX): HIV 1&2 Ab, 4th Generation: NONREACTIVE

## 2021-03-23 ENCOUNTER — Telehealth: Payer: Self-pay | Admitting: Family

## 2021-03-23 ENCOUNTER — Other Ambulatory Visit (HOSPITAL_COMMUNITY): Payer: Self-pay

## 2021-03-23 ENCOUNTER — Telehealth: Payer: Self-pay

## 2021-03-23 ENCOUNTER — Other Ambulatory Visit: Payer: Self-pay | Admitting: Family

## 2021-03-23 MED ORDER — SOFOSBUVIR-VELPATASVIR 400-100 MG PO TABS
1.0000 | ORAL_TABLET | Freq: Every day | ORAL | 2 refills | Status: DC
  Filled 2021-03-23 – 2021-03-26 (×2): qty 28, 28d supply, fill #0
  Filled 2021-04-17: qty 28, 28d supply, fill #1
  Filled 2021-05-14: qty 28, 28d supply, fill #2

## 2021-03-23 NOTE — Telephone Encounter (Signed)
RCID Patient Advocate Encounter   Received notification from [facility] that prior authorization for Edward Cuevas is required.   PA submitted on 03/23/21 Key [id number] Status is pending    [facility] will continue to follow.   Ileene Patrick, [facility] Patient [facility] for Infectious Disease Phone: [phone] Fax:  [phone]

## 2021-03-23 NOTE — Telephone Encounter (Signed)
I will start working on the [place].

## 2021-03-23 NOTE — Telephone Encounter (Signed)
Attempted to contact Mr. Lince and was unable to reach him via the listed phone number. A HIPPA compliant voicemail was left. Mr. Ledo is a 65 y/o with treatment experienced Hepatitis C with Genotype 2 with initial viral load of 4.3 million IU/ml and Fibrosis score of F3-F4 with likely advanced fibrosis. Will need GI consult for liver fibrosis. Plan for treatment with 12 weeks of Epclusa.   Terri Piedra, NP 03/23/2021 12:51 PM

## 2021-03-24 ENCOUNTER — Ambulatory Visit (INDEPENDENT_AMBULATORY_CARE_PROVIDER_SITE_OTHER): Payer: PPO | Admitting: Physician Assistant

## 2021-03-24 DIAGNOSIS — Z0000 Encounter for general adult medical examination without abnormal findings: Secondary | ICD-10-CM

## 2021-03-24 NOTE — Patient Instructions (Addendum)
[place] Maintenance Summary and Written Plan of Care  Mr. Edward Cuevas ,  Thank you for allowing me to perform your Medicare Annual Wellness Visit and for your ongoing commitment to your health.   Health Maintenance & Immunization History Health Maintenance  Topic Date Due  . COVID-19 Vaccine (4 - Booster for Moderna series) 04/09/2021 (Originally 10/06/2020)  . Zoster Vaccines- Shingrix (1 of 2) 06/24/2021 (Originally 10/18/2004)  . Pneumococcal Vaccine 66-46 Years old (1 of 4 - PCV13) 03/24/2022 (Originally 10[birth year]18/1960)  . COLONOSCOPY (Pts 66-43yrs Insurance coverage will need to be confirmed)  03/24/2022 (Originally 06/03/2020)  . TETANUS/TDAP  04/11/2021  . INFLUENZA VACCINE  05/18/2021  . Hepatitis C Screening  Completed  . HPV VACCINES  Aged Out  . PNA vac Low Risk Adult  Discontinued   Immunization History  Administered Date(s) Administered  . Fluad Quad(high Dose 66+) 07/14/2020  . Influenza,inj,Quad PF,6+ Mos 07/11/2019  . Moderna Sars-Covid-2 Vaccination 12/25/2019, 01/22/2020, 07/07/2020  . Pneumococcal Polysaccharide-23 04/07/2009  . Tdap 04/12/2011    These are the patient goals that we discussed: Goals Addressed              This Visit's Progress   .  Patient Stated (pt-stated)        03/24/2021 AWV Goal: Exercise for General Health   Patient will verbalize understanding of the benefits of increased physical activity:  Exercising regularly is important. It will improve your overall fitness, flexibility, and endurance.  Regular exercise also will improve your overall health. It can help you control your weight, reduce stress, and improve your bone density.  Over the next year, patient will increase physical activity as tolerated with a goal of at least 150 minutes of moderate physical activity per week.   You can tell that you are exercising at a moderate intensity if your heart starts beating faster and you start breathing faster but  can still hold a conversation.  Moderate-intensity exercise ideas include:  Walking 1 mile (1.6 km) in about 15 minutes  Biking  Hiking  Golfing  Dancing  Water aerobics  Patient will verbalize understanding of everyday activities that increase physical activity by providing examples like the following: ? Yard work, such as: ? Pushing a Conservation officer, nature ? Raking and bagging leaves ? Washing your car ? Pushing a stroller ? Shoveling snow ? Gardening ? Washing windows or floors  Patient will be able to explain general safety guidelines for exercising:   Before you start a new exercise program, talk with your health care provider.  Do not exercise so much that you hurt yourself, feel dizzy, or get very short of breath.  Wear comfortable clothes and wear shoes with good support.  Drink plenty of water while you exercise to prevent dehydration or heat stroke.  Work out until your breathing and your heartbeat get faster.         This is a list of Health Maintenance Items that are overdue or due now: Shingles vaccine, Colonoscopy  Orders/Referrals Placed Today: No orders of the defined types were placed in this encounter.  (Contact our referral department at [phone] if you have not spoken with someone about your referral appointment within the next 5 days)    Follow-up Plan . Follow-up with Donella Stade, PA-C as planned . Schedule your shingles vaccine at your pharmacy.  . Please let us know when you are ready for [place] to send the referral for your lung cancer screening CT scan.  [name]  Medicare wellness visit in one year.  . AVS printed and mailed.

## 2021-03-24 NOTE — Progress Notes (Signed)
MEDICARE ANNUAL WELLNESS VISIT  03/24/2021  Telephone Visit Disclaimer This Medicare AWV was conducted by telephone due to national recommendations for restrictions regarding the COVID-19 Pandemic (e.g. social distancing).  I verified, using two identifiers, that I am speaking with Edward Cuevas or their authorized healthcare agent. I discussed the limitations, risks, security, and privacy concerns of performing an evaluation and management service by telephone and the potential availability of an in-person appointment in the future. The patient expressed understanding and agreed to proceed.  Location of Patient: Home Location of Provider (nurse):  In the office.  Subjective:    Edward Cuevas is a 65 y.o. male patient of Breeback, Royetta Car, PA-C who had a [employer] Visit today via telephone. [name] is Retired and lives with their ex-spouse. he has 2 children. he reports that he is socially active and does interact with friends/family regularly. he is minimally physically active and enjoys watching tv.  Patient Care Team: Lavada Mesi as PCP - General (Family Medicine)  Advanced Directives 03/24/2021 05/15/2019 12/26/2015 10/04/2014  Does Patient Have a Medical Advance Directive? No No No No  Would patient like information on creating a medical advance directive? No - Patient declined No - Patient declined - Yes - Educational materials given    Hospital Utilization Over the Past 12 Months: # of hospitalizations or ER visits: 0 # of surgeries: 0  Review of Systems    Patient reports that his overall health is better compared to last year.  History obtained from chart review and the patient  Patient Reported Readings (BP, Pulse, CBG, Weight, etc) none  Pain Assessment Pain : No/denies pain     Current Medications & Allergies (verified) Allergies as of 03/24/2021      Reactions   Abilify [aripiprazole]    Feel weird.    Penicillins Itching, Rash   Has patient  had a PCN reaction causing immediate rash, facial/tongue/throat swelling, SOB or lightheadedness with hypotension: Yes Has patient had a PCN reaction causing severe rash involving mucus membranes or skin necrosis: No Has patient had a PCN reaction that required hospitalization No Has patient had a PCN reaction occurring within the last 10 years: No If all of the above answers are "NO", then may proceed with Cephalosporin use.      Medication List       Accurate as of March 24, 2021  2:18 PM. If you have any questions, ask your nurse or doctor.        acetaminophen 650 MG CR tablet Commonly known as: TYLENOL Take 650 mg by mouth 2 (two) times daily.   ALPRAZolam 0.5 MG tablet Commonly known as: Xanax Take 1 tablet (0.5 mg total) by mouth 2 (two) times daily.   aspirin EC 81 MG tablet Take 1 tablet (81 mg total) by mouth daily.   DULoxetine 60 MG capsule Commonly known as: Cymbalta Take 1 capsule (60 mg total) by mouth daily.   Fluticasone-Umeclidin-Vilant 200-62.5-25 MCG/INH Aepb Inhale 1 puff into the lungs daily.   Trelegy Ellipta 100-62.5-25 MCG/INH Aepb Generic drug: Fluticasone-Umeclidin-Vilant INHALE 1 PUFF INTO THE LUNGS DAILY   hydrocortisone 2.5 % rectal cream Commonly known as: Anusol-HC Place 1 application rectally 2 (two) times daily.   Myrbetriq 50 MG Tb24 tablet Generic drug: mirabegron ER Take 1 tablet (50 mg total) by mouth daily.   nitroGLYCERIN 0.4 MG SL tablet Commonly known as: NITROSTAT Place 1 tablet (0.4 mg total) under the tongue every 5 (five) minutes  as needed for chest pain.   risperiDONE 1 MG tablet Commonly known as: RISPERDAL TAKE ONE TABLET AT BEDTIME   rosuvastatin 40 MG tablet Commonly known as: CRESTOR TAKE 1 TABLET(40 MG) BY MOUTH DAILY   Sofosbuvir-Velpatasvir 400-100 MG Tabs Commonly known as: Epclusa Take 1 tablet by mouth daily.   tamsulosin 0.4 MG Caps capsule Commonly known as: FLOMAX Take 1 capsule (0.4 mg total) by  mouth at bedtime.       History (reviewed): Past Medical History:  Diagnosis Date  . Anxiety   . Arthritis   . Cancer ([place])    skin  . COPD (chronic obstructive pulmonary disease) ([place])    per pt ?  [name] Depression   . Hepatitis    A,B,C  . Hyperlipidemia   . Hypertension   . Prediabetes   . Vitamin D deficiency    Past Surgical History:  Procedure Laterality Date  . ELBOW SURGERY Right 1986  . [place]  . HERNIA REPAIR Left 1996  . INGUINAL HERNIA REPAIR Right 12/30/2015   Procedure: RIGHT RIGHT INGUINAL HERNIA REPAIR;  Surgeon: Ralene Ok, MD;  Location: [place] [facility];  Service: General;  Laterality: Right;  . INSERTION OF MESH Right 12/30/2015   Procedure: INSERTION OF MESH;  Surgeon: Ralene Ok, MD;  Location: [facility];  Service: General;  Laterality: Right;  . SHOULDER SURGERY Left 2007   Family History  Problem Relation Age of Onset  . Diabetes Mother   . Heart disease Father   . Heart attack Father   . Heart disease Maternal Grandmother    Social History   Socioeconomic History  . Marital status: Divorced    Spouse name: Not on file  . Number of children: 2  . Years of education: 55  . Highest education level: 11th grade  Occupational History  . Occupation: superintendant    Comment: retired  Tobacco Use  . Smoking status: Current Every Day Smoker    Packs/day: 1.50    Years: 30.00    Pack years: 45.00    Types: Cigarettes  . Smokeless tobacco: Never Used  Vaping Use  . Vaping Use: Some days  Substance and Sexual Activity  . Alcohol use: Yes    Alcohol/week: 4.0 standard drinks    Types: 4 Cans of beer per week    Comment: daily  . Drug use: No  . Sexual activity: Not Currently  Other Topics Concern  . Not on file  Social History Narrative   Lives with his ex-wife. He enjoys watching television.   Social Determinants of Health   Financial Resource Strain: Low Risk   . Difficulty of Paying Living Expenses: Not hard at  all  Food Insecurity: No Food Insecurity  . Worried About Charity fundraiser in the Last Year: Never true  . Ran Out of Food in the Last Year: Never true  Transportation Needs: No Transportation Needs  . Lack of Transportation (Medical): No  . Lack of Transportation (Non-Medical): No  Physical Activity: Inactive  . Days of Exercise per Week: 0 days  . Minutes of Exercise per Session: 0 min  Stress: No Stress Concern Present  . Feeling of Stress : Not at all  Social Connections: Socially Isolated  . Frequency of Communication with Friends and Family: More than three times a week  . Frequency of Social Gatherings with Friends and Family: Never  . Attends Religious Services: Never  . Active Member of Clubs or Organizations:  No  . Attends Club or Organization Meetings: Never  . Marital Status: Divorced    Activities of Daily Living In your present state of health, do you have any difficulty performing the following activities: 03/24/2021  Hearing? Y  Comment has noticed some hearing loss in both ears.  Vision? N  Difficulty concentrating or making decisions? Y  Comment has noticed some memory issues.  Walking or climbing stairs? N  Comment has pain with walking or climbing stairs.  Dressing or bathing? N  Doing errands, shopping? N  Preparing Food and eating ? N  Using the Toilet? N  In the past six months, have you accidently leaked urine? N  Do you have problems with loss of bowel control? N  Managing your Medications? N  Managing your Finances? N  Housekeeping or managing your Housekeeping? N  Some recent data might be hidden    Patient Education/ Literacy How often do you need to have someone help you when you read instructions, pamphlets, or other written materials from your doctor or pharmacy?: 1 - Never What is the last grade level you completed in school?: 11th grade.  Exercise Current Exercise Habits: The patient does not participate in regular exercise at present,  Exercise limited by: None identified  Diet Patient reports consuming 2 meals a day and 1 snack(s) a day Patient reports that his primary diet is: Regular Patient reports that she does have regular access to food.   Depression Screen PHQ 2/9 Scores 03/24/2021 07/14/2020 01/09/2020 11/28/2019 07/11/2019 05/15/2019 04/09/2019  PHQ - 2 Score 0 0 4 6 2 3 3   PHQ- 9 Score 0 4 10 17 5 7 8      Fall Risk Fall Risk  03/24/2021 07/14/2020 05/15/2019 12/15/2018 12/09/2018  Falls in the past year? 0 0 0 0 0  Number falls in past yr: 0 0 - - 0  Injury with Fall? 0 0 0 - 0  Risk for fall due to : No Fall Risks - Impaired mobility - -  Follow up Falls evaluation completed Falls evaluation completed Falls prevention discussed Falls evaluation completed Falls evaluation completed     Objective:  Rathana Viveros seemed alert and oriented and he participated appropriately during our telephone visit.  Blood Pressure Weight BMI  BP Readings from Last 3 Encounters:  03/09/21 114/71  01/15/21 124/74  07/14/20 131/86   Wt Readings from Last 3 Encounters:  03/09/21 187 lb (84.8 kg)  01/15/21 189 lb 6.4 oz (85.9 kg)  07/14/20 189 lb (85.7 kg)   BMI Readings from Last 1 Encounters:  03/09/21 26.83 kg/m    *Unable to obtain current vital signs, weight, and BMI due to telephone visit type  Hearing/Vision  . Bolden did not seem to have difficulty with hearing/understanding during the telephone conversation . Reports that he has not had a formal eye exam by an eye care professional within the past year . Reports that he has not had a formal hearing evaluation within the past year *Unable to fully assess hearing and vision during telephone visit type  Cognitive Function: 6CIT Screen 03/24/2021 05/15/2019  What Year? 0 points 0 points  What month? 0 points 0 points  What time? 0 points 0 points  Count back from 20 0 points 2 points  Months in reverse 4 points 4 points  Repeat phrase 4 points 4 points  Total Score 8 10    (Normal:0-7, Significant for Dysfunction: >8)  Normal Cognitive Function Screening: Yes   Immunization &  Health Maintenance Record Immunization History  Administered Date(s) Administered  . Fluad Quad(high Dose 65+) 07/14/2020  . Influenza,inj,Quad PF,6+ Mos 07/11/2019  . Moderna Sars-Covid-2 Vaccination 12/25/2019, 01/22/2020, 07/07/2020  . Pneumococcal Polysaccharide-23 04/07/2009  . Tdap 04/12/2011    Health Maintenance  Topic Date Due  . COVID-19 Vaccine (4 - Booster for Moderna series) 04/09/2021 (Originally 10/06/2020)  . Zoster Vaccines- Shingrix (1 of 2) 06/24/2021 (Originally 10/18/2004)  . Pneumococcal Vaccine 7-24 Years old (1 of 4 - PCV13) 03/24/2022 (Originally 10/18/1961)  . COLONOSCOPY (Pts 45-7yrs Insurance coverage will need to be confirmed)  03/24/2022 (Originally 06/03/2020)  . TETANUS/TDAP  04/11/2021  . INFLUENZA VACCINE  05/18/2021  . Hepatitis C Screening  Completed  . HPV VACCINES  Aged Out  . PNA vac Low Risk Adult  Discontinued       Assessment  This is a routine wellness examination for Edward Cuevas.  Health Maintenance: Due or Overdue There are no preventive care reminders to display for this patient.  Edward Cuevas does not need a referral for Community Assistance: Care Management:   no Social Work:    no Prescription Assistance:  no Nutrition/Diabetes Education:  no   Plan:  Personalized Goals Goals Addressed              This Visit's Progress   .  Patient Stated (pt-stated)        03/24/2021 AWV Goal: Exercise for General Health   Patient will verbalize understanding of the benefits of increased physical activity:  Exercising regularly is important. It will improve your overall fitness, flexibility, and endurance.  Regular exercise also will improve your overall health. It can help you control your weight, reduce stress, and improve your bone density.  Over the next year, patient will increase physical activity as tolerated with a  goal of at least 150 minutes of moderate physical activity per week.   You can tell that you are exercising at a moderate intensity if your heart starts beating faster and you start breathing faster but can still hold a conversation.  Moderate-intensity exercise ideas include:  Walking 1 mile (1.6 km) in about 15 minutes  Biking  Hiking  Golfing  Dancing  Water aerobics  Patient will verbalize understanding of everyday activities that increase physical activity by providing examples like the following: ? Yard work, such as: ? Pushing a Conservation officer, nature ? Raking and bagging leaves ? Washing your Cuevas ? Pushing a stroller ? Shoveling snow ? Gardening ? Washing windows or floors  Patient will be able to explain general safety guidelines for exercising:   Before you start a new exercise program, talk with your health care provider.  Do not exercise so much that you hurt yourself, feel dizzy, or get very short of breath.  Wear comfortable clothes and wear shoes with good support.  Drink plenty of water while you exercise to prevent dehydration or heat stroke.  Work out until your breathing and your heartbeat get faster.       Personalized Health Maintenance & Screening Recommendations  Shingles vaccine, Colonoscopy  Lung Cancer Screening Recommended: yes (Low Dose CT Chest recommended if Age 14-80 years, 30 pack-year currently smoking OR have quit w/in past 15 years) Hepatitis C Screening recommended: no HIV Screening recommended: no  Advanced Directives: Written information was not prepared per patient's request.  Referrals & Orders No orders of the defined types were placed in this encounter.   Follow-up Plan . Follow-up with Donella Stade, PA-C as planned .  Schedule your shingles vaccine at your pharmacy.  . Please let us know when you are ready for [place] to send the referral for your lung cancer screening CT scan.  . Medicare wellness visit in one year.  . AVS  printed and mailed.   I have personally reviewed and noted the following in the patient's chart:   . Medical and social history . Use of alcohol, tobacco or illicit drugs  . Current medications and supplements . Functional ability and status . Nutritional status . Physical activity . Advanced directives . List of other physicians . Hospitalizations, surgeries, and ER visits in previous 12 months . Vitals . Screenings to include cognitive, depression, and falls . Referrals and appointments  In addition, I have reviewed and discussed with Edward Cuevas certain preventive protocols, quality metrics, and best practice recommendations. A written personalized care plan for preventive services as well as general preventive health recommendations is available and can be mailed to the patient at his request.      Tinnie Gens, RN  03/24/2021

## 2021-03-25 ENCOUNTER — Other Ambulatory Visit (HOSPITAL_COMMUNITY): Payer: Self-pay

## 2021-03-26 ENCOUNTER — Telehealth: Payer: Self-pay

## 2021-03-26 ENCOUNTER — Telehealth: Payer: Self-pay | Admitting: Pharmacist

## 2021-03-26 ENCOUNTER — Other Ambulatory Visit (HOSPITAL_COMMUNITY): Payer: Self-pay

## 2021-03-26 DIAGNOSIS — E785 Hyperlipidemia, unspecified: Secondary | ICD-10-CM

## 2021-03-26 MED ORDER — ROSUVASTATIN CALCIUM 10 MG PO TABS
10.0000 mg | ORAL_TABLET | Freq: Every day | ORAL | 2 refills | Status: DC
Start: 2021-03-26 — End: 2021-06-23

## 2021-03-26 NOTE — Telephone Encounter (Signed)
[facility] Patient Advocate Encounter  Prior Authorization for Epclusa (genric) has been approved.    Effective dates: 03/25/21 through 06/17/21  Patients co-pay is $1079.85.   I will apply for PAF once I get patient income information.  [facility] will continue to follow.  Ileene Patrick, [facility] Patient [facility] for Infectious Disease Phone: [phone] Fax:  [phone]

## 2021-03-26 NOTE — Telephone Encounter (Signed)
Patient is approved to receive Epclusa x 12 weeks for chronic Hepatitis C infection. Counseled patient to take Epclusa daily with or without food. Encouraged patient not to miss any doses and explained how their chance of cure could go down with each dose missed. Counseled patient on what to do if dose is missed - if it is closer to the missed dose take immediately; if closer to next dose skip dose and take the next dose at the usual time.   Counseled patient on common side effects such as headache, fatigue, and nausea and that these normally decrease with time. I reviewed patient medications and found a drug interaction with his Crestor 40 mg. Max recommended dose of Crestor with Epclusa is 10 mg due to increased AUC seen with Crestor. Will change patient to 10 mg daily just while taking Epclusa. Asked him to alert his cardiologist. Will send an epic note as well. Discussed with patient that there are several drug interactions including acid suppressants. Instructed patient to call clinic if he wishes to start a new medication during course of therapy. Also advised patient to call if he experiences any side effects. Patient will follow-up with me in the pharmacy clinic in 4 weeks.

## 2021-03-26 NOTE — Telephone Encounter (Signed)
[facility] Patient Advocate Encounter   I was successful in securing patient a $15000.00 grant from [employer] (PAF) to provide copayment coverage for Epclusa.  This will make the out of pocket cost $0.00.     I have spoken with the patient.    The billing information is as follows and has been shared with [place].     Approved effective 03/26/21-10/17/21   Patient knows to call the office with questions or concerns.  Ileene Patrick, [facility] Patient [facility] for Infectious Disease Phone: [phone] Fax:  [phone]

## 2021-04-06 ENCOUNTER — Encounter: Payer: Self-pay | Admitting: Gastroenterology

## 2021-04-08 ENCOUNTER — Other Ambulatory Visit: Payer: Self-pay

## 2021-04-08 ENCOUNTER — Ambulatory Visit (HOSPITAL_COMMUNITY)
Admission: RE | Admit: 2021-04-08 | Discharge: 2021-04-08 | Disposition: A | Discharge status: Home / Self Care | Payer: PPO | Source: Ambulatory Visit | Attending: Cardiology | Admitting: Cardiology

## 2021-04-08 DIAGNOSIS — I6521 Occlusion and stenosis of right carotid artery: Secondary | ICD-10-CM | POA: Insufficient documentation

## 2021-04-08 NOTE — Progress Notes (Signed)
Carotid artery duplex has been completed. Preliminary results can be found in CV Proc through chart review.   04/08/21 12:49 PM Edward Cuevas RVT

## 2021-04-13 ENCOUNTER — Telehealth: Payer: Self-pay

## 2021-04-13 NOTE — Telephone Encounter (Signed)
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

## 2021-04-13 NOTE — Telephone Encounter (Signed)
Spoke with patient regarding results and recommendation.  Patient verbalizes understanding and is agreeable to plan of care. Advised patient to call back with any issues or concerns.  

## 2021-04-16 ENCOUNTER — Other Ambulatory Visit (HOSPITAL_COMMUNITY): Payer: Self-pay

## 2021-04-17 ENCOUNTER — Other Ambulatory Visit (HOSPITAL_COMMUNITY): Payer: Self-pay

## 2021-04-22 ENCOUNTER — Other Ambulatory Visit (HOSPITAL_COMMUNITY): Payer: Self-pay

## 2021-04-27 ENCOUNTER — Other Ambulatory Visit: Payer: Self-pay

## 2021-04-27 ENCOUNTER — Ambulatory Visit (INDEPENDENT_AMBULATORY_CARE_PROVIDER_SITE_OTHER): Payer: PPO | Admitting: Pharmacist

## 2021-04-27 DIAGNOSIS — B182 Chronic viral hepatitis C: Secondary | ICD-10-CM

## 2021-04-27 NOTE — Progress Notes (Signed)
HPI: Edward Cuevas is a 65 y.o. male who presents to the [facility] for Hepatitis C follow-up.  Medication: Epclusa x 12 weeks  Start Date: 03/30/21  Hepatitis C Genotype: 2  Fibrosis Score: F3/F4  Hepatitis C RNA: 4.3 million on 02/25/21  Patient Active Problem List   Diagnosis Date Noted   Positive colorectal cancer screening using Cologuard test 03/10/2021   Chronic hepatitis C with hepatic coma ([place]) 03/09/2021   Hyperlipidemia    Hepatitis    Depression    COPD (chronic obstructive pulmonary disease) (HCC)    Cancer (HCC)    Arthritis    Anxiety    OAB (overactive bladder) 07/12/2019   Dyspnea on exertion 05/31/2019   Centrilobular emphysema ([place]) 05/16/2019   Moderate episode of recurrent major depressive disorder ([place]) 05/16/2019   Aortic atherosclerosis ([place]) 05/16/2019   Coronary artery disease due to calcified coronary lesion 05/16/2019   Pulmonary nodules 05/16/2019   Adrenal adenoma 04/17/2019   Carotid stenosis, right 04/17/2019   Abdominal distension (gaseous) 04/11/2019   Right carotid bruit 04/09/2019   Right lower quadrant abdominal pain 04/09/2019   Current smoker 04/09/2019   Lumbar radiculopathy 01/18/2017   Chronic obstructive pulmonary disease ([place]) 12/29/2016   Fatty liver 10/01/2016   BPH without obstruction/lower urinary tract symptoms 11/25/2015   Chronic pain of multiple joints 11/25/2015   Actinic keratosis 06/27/2015   GAD (generalized anxiety disorder) 11/01/2014   History of hepatitis C 10/08/2014   Right inguinal hernia 10/08/2014   Panic attacks 10/04/2014   DJD 10/23/2013   DDD (degenerative disc disease), lumbosacral 10/23/2013   Mixed hyperlipidemia    Hypertension    Prediabetes    Vitamin D deficiency     Patient's Medications  New Prescriptions   No medications on file  Previous Medications   ACETAMINOPHEN (TYLENOL) 650 MG CR TABLET    Take 650 mg by mouth 2 (two) times daily.   ALPRAZOLAM (XANAX) 0.5 MG  TABLET    Take 1 tablet (0.5 mg total) by mouth 2 (two) times daily.   ASPIRIN EC 81 MG TABLET    Take 1 tablet (81 mg total) by mouth daily.   DULOXETINE (CYMBALTA) 60 MG CAPSULE    Take 1 capsule (60 mg total) by mouth daily.   FLUTICASONE-UMECLIDIN-VILANT 200-62.5-25 MCG/INH AEPB    Inhale 1 puff into the lungs daily.   HYDROCORTISONE (ANUSOL-HC) 2.5 % RECTAL CREAM    Place 1 application rectally 2 (two) times daily.   MYRBETRIQ 50 MG TB24 TABLET    Take 1 tablet (50 mg total) by mouth daily.   NITROGLYCERIN (NITROSTAT) 0.4 MG SL TABLET    Place 1 tablet (0.4 mg total) under the tongue every 5 (five) minutes as needed for chest pain.   RISPERIDONE (RISPERDAL) 1 MG TABLET    TAKE ONE TABLET AT BEDTIME   ROSUVASTATIN (CRESTOR) 10 MG TABLET    Take 1 tablet (10 mg total) by mouth daily.   SOFOSBUVIR-VELPATASVIR (EPCLUSA) 400-100 MG TABS    Take 1 tablet by mouth daily.   TAMSULOSIN (FLOMAX) 0.4 MG CAPS CAPSULE    Take 1 capsule (0.4 mg total) by mouth at bedtime.   TRELEGY ELLIPTA 100-62.5-25 MCG/INH AEPB    INHALE 1 PUFF INTO THE LUNGS DAILY  Modified Medications   No medications on file  Discontinued Medications   No medications on file    Allergies: Allergies  Allergen Reactions   Abilify [Aripiprazole]     Feel weird.  Penicillins Itching and Rash    Has patient had a PCN reaction causing immediate rash, facial/tongue/throat swelling, SOB or lightheadedness with hypotension: Yes Has patient had a PCN reaction causing severe rash involving mucus membranes or skin necrosis: No Has patient had a PCN reaction that required hospitalization No Has patient had a PCN reaction occurring within the last 10 years: No If all of the above answers are "NO", then may proceed with Cephalosporin use.     Past Medical History: Past Medical History:  Diagnosis Date   Anxiety    Arthritis    Cancer ([place])    skin   COPD (chronic obstructive pulmonary disease) ([place])    per pt ?   Depression     Hepatitis    A,B,C   Hyperlipidemia    Hypertension    Prediabetes    Vitamin D deficiency     Social History: Social History   Socioeconomic History   Marital status: Divorced    Spouse name: Not on file   Number of children: 2   Years of education: 11   Highest education level: 11th grade  Occupational History   Occupation: superintendant    Comment: retired  Tobacco Use   Smoking status: Every Day    Packs/day: 1.50    Years: 30.00    Pack years: 45.00    Types: Cigarettes   Smokeless tobacco: Never  Vaping Use   Vaping Use: Some days  Substance and Sexual Activity   Alcohol use: Yes    Alcohol/week: 4.0 standard drinks    Types: 4 Cans of beer per week    Comment: daily   Drug use: No   Sexual activity: Not Currently  Other Topics Concern   Not on file  Social History Narrative   Lives with his ex-wife. He enjoys watching television.   Social Determinants of Health   Financial Resource Strain: Low Risk    Difficulty of Paying Living Expenses: Not hard at all  Food Insecurity: No Food Insecurity   Worried About Charity fundraiser in the Last Year: Never true   [place] in the Last Year: Never true  Transportation Needs: No Transportation Needs   Lack of Transportation (Medical): No   Lack of Transportation (Non-Medical): No  Physical Activity: Inactive   Days of Exercise per Week: 0 days   Minutes of Exercise per Session: 0 min  Stress: No Stress Concern Present   Feeling of Stress : Not at all  Social Connections: Socially Isolated   Frequency of Communication with Friends and Family: More than three times a week   Frequency of Social Gatherings with Friends and Family: Never   Attends Religious Services: Never   Marine scientist or Organizations: No   Attends Archivist Meetings: Never   Marital Status: Divorced    Labs: Hepatitis C Lab Results  Component Value Date   HCVGENOTYPE 2 03/09/2021   HEPCAB REACTIVE (A)  02/25/2021   HCVRNAPCRQN 4,300,000 (H) 02/25/2021   HCVRNAPCRQN CANCELED 02/25/2021   HCVRNAPCRQN 5,570,000 (H) 02/25/2021   HCVRNAPCRQN 2,640,000 (H) 02/25/2021   FIBROSTAGE F3-F4 03/09/2021   Hepatitis B Lab Results  Component Value Date   HEPBSAB REACTIVE (A) 03/09/2021   HEPBSAG NON-REACTIVE 03/09/2021   HEPBCAB REACTIVE (A) 08/24/2016   Hepatitis A Lab Results  Component Value Date   HAV NON REACTIVE 09/19/2014   HIV Lab Results  Component Value Date   HIV NON-REACTIVE 03/09/2021   HIV  NONREACTIVE 09/19/2014   Lab Results  Component Value Date   CREATININE 0.95 02/25/2021   CREATININE 0.86 07/29/2020   CREATININE 0.96 07/16/2019   CREATININE 0.90 07/09/2019   CREATININE 1.03 04/10/2019   Lab Results  Component Value Date   AST 54 (H) 03/09/2021   AST 55 (H) 02/25/2021   AST 63 (H) 07/29/2020   AST 63 (H) 07/29/2020   ALT 77 (H) 03/09/2021   ALT 78 (H) 03/09/2021   ALT 78 (H) 02/25/2021   INR 1.0 03/09/2021    Assessment: Edward Cuevas is here today for his 4-week follow up for the treatment of his Hepatitis C. He started 12 weeks of Epclusa back on 03/30/21 and is tolerating it well. He is experiencing some mild fatigue, but no GI issues or headaches. He takes it in the morning around the same time each day and has not missed any doses. He had a drug interaction with his Crestor medication and picked up his lower dose of 10 mg. He can go back on the higher dose once he has completed treatment. He has received his 2nd month in the mail and started it yesterday. I reminded him that it is a 3 month course (he thought it was only 2 months). Encouraged him to continue doing a great job with his adherence and not miss any doses. Will check his Hep C viral load and LFTs today and have him see Marya Amsler when he has completed treatment.  Plan: - Continue Epclusa x 12 weeks - Hep C RNA + CMET today - F/u with Greg in 2 months  Krista Godsil L. Kayra Crowell, PharmD, BCIDP, AAHIVP,  CPP Clinical Pharmacist Practitioner Infectious Diseases [facility] 04/27/2021, 1:48 PM

## 2021-04-29 LAB — COMPREHENSIVE METABOLIC PANEL
AG Ratio: 1.7 (calc) (ref 1.0–2.5)
ALT: 13 U/L (ref 9–46)
AST: 21 U/L (ref 10–35)
Albumin: 4.5 g/dL (ref 3.6–5.1)
Alkaline phosphatase (APISO): 45 U/L (ref 35–144)
BUN: 11 mg/dL (ref 7–25)
CO2: 32 mmol/L (ref 20–32)
Calcium: 9.6 mg/dL (ref 8.6–10.3)
Chloride: 103 mmol/L (ref 98–110)
Creat: 0.9 mg/dL (ref 0.70–1.35)
Globulin: 2.6 g/dL (calc) (ref 1.9–3.7)
Glucose, Bld: 103 mg/dL — ABNORMAL HIGH (ref 65–99)
Potassium: 5.1 mmol/L (ref 3.5–5.3)
Sodium: 137 mmol/L (ref 135–146)
Total Bilirubin: 0.4 mg/dL (ref 0.2–1.2)
Total Protein: 7.1 g/dL (ref 6.1–8.1)

## 2021-04-29 LAB — HEPATITIS C RNA QUANTITATIVE
HCV Quantitative Log: <1.18 log IU/mL
HCV RNA, PCR, QN: <15 IU/mL

## 2021-05-05 ENCOUNTER — Telehealth: Payer: Self-pay

## 2021-05-05 NOTE — Telephone Encounter (Signed)
Patient called to get lab results from the appointment he had on 7/11 with Cassie the Pharmacist. I did inform patient he had a office visit set with [place] on 9/12 at 1:45pm. His contact number is [phone]

## 2021-05-08 NOTE — Telephone Encounter (Signed)
Called patient and discussed his lab work. He is undetectable and his liver function is back to normal. Patient appreciated the call and had no further questions.

## 2021-05-14 ENCOUNTER — Other Ambulatory Visit (HOSPITAL_COMMUNITY): Payer: Self-pay

## 2021-05-15 ENCOUNTER — Telehealth: Payer: Self-pay | Admitting: Adult Health

## 2021-05-15 ENCOUNTER — Other Ambulatory Visit: Payer: Self-pay

## 2021-05-15 DIAGNOSIS — F410 Panic disorder [episodic paroxysmal anxiety] without agoraphobia: Secondary | ICD-10-CM

## 2021-05-15 MED ORDER — ALPRAZOLAM 0.5 MG PO TABS: 0.5000 mg | ORAL_TABLET | Freq: Two times a day (BID) | ORAL | 0 refills | Status: DC

## 2021-05-15 NOTE — Telephone Encounter (Signed)
Pt called in for refill on Xanax 0.'5mg'$ . Called pharmacy and they advised him to call if he needed prescription filled for the weekend. Appt 8/15. Ph: [phone]. Pharmacy  [facility] [address] [place].

## 2021-05-15 NOTE — Telephone Encounter (Signed)
Pended.

## 2021-05-18 ENCOUNTER — Other Ambulatory Visit: Payer: Self-pay

## 2021-05-19 ENCOUNTER — Other Ambulatory Visit (HOSPITAL_COMMUNITY): Payer: Self-pay

## 2021-05-28 ENCOUNTER — Ambulatory Visit: Payer: PPO | Admitting: Adult Health

## 2021-06-01 ENCOUNTER — Ambulatory Visit: Payer: PPO | Admitting: Adult Health

## 2021-06-11 ENCOUNTER — Encounter: Payer: Self-pay | Admitting: Adult Health

## 2021-06-11 ENCOUNTER — Other Ambulatory Visit: Payer: Self-pay

## 2021-06-11 ENCOUNTER — Telehealth: Payer: Self-pay | Admitting: Lab

## 2021-06-11 ENCOUNTER — Ambulatory Visit: Payer: PPO | Admitting: Adult Health

## 2021-06-11 ENCOUNTER — Other Ambulatory Visit (HOSPITAL_COMMUNITY): Payer: Self-pay

## 2021-06-11 DIAGNOSIS — F411 Generalized anxiety disorder: Secondary | ICD-10-CM | POA: Diagnosis not present

## 2021-06-11 DIAGNOSIS — G4700 Insomnia, unspecified: Secondary | ICD-10-CM | POA: Diagnosis not present

## 2021-06-11 DIAGNOSIS — F410 Panic disorder [episodic paroxysmal anxiety] without agoraphobia: Secondary | ICD-10-CM

## 2021-06-11 DIAGNOSIS — F331 Major depressive disorder, recurrent, moderate: Secondary | ICD-10-CM | POA: Diagnosis not present

## 2021-06-11 MED ORDER — ALPRAZOLAM 0.5 MG PO TABS
0.5000 mg | ORAL_TABLET | Freq: Two times a day (BID) | ORAL | 2 refills | Status: DC
Start: 2021-06-11 — End: 2021-09-14

## 2021-06-11 MED ORDER — RISPERIDONE 1 MG PO TABS: ORAL_TABLET | ORAL | 5 refills | Status: DC

## 2021-06-11 MED ORDER — DULOXETINE HCL 60 MG PO CPEP: 60.0000 mg | ORAL_CAPSULE | Freq: Every day | ORAL | 5 refills | Status: DC

## 2021-06-11 NOTE — Chronic Care Management (AMB) (Signed)
  Chronic Care Management   Note  06/11/2021 Name: Bonner Jani MRN: [id number] DOB: 20-Jan-1956  Konstantinos Giang is a 65 y.o. year old male who is a primary care patient of Donella Stade, PA-C. I reached out to Lynford Citizen by phone today in response to a referral sent by Mr. Shanan Reisch's PCP, Donella Stade, PA-C.   Mr. Riley was given information about Chronic Care Management services today including:  CCM service includes personalized support from designated clinical staff supervised by his physician, including individualized plan of care and coordination with other care providers 24/7 contact phone numbers for assistance for urgent and routine care needs. Service will only be billed when office clinical staff spend 20 minutes or more in a month to coordinate care. Only one practitioner may furnish and bill the service in a calendar month. The patient may stop CCM services at any time (effective at the end of the month) by phone call to the office staff.   Patient agreed to services and verbal consent obtained.   Follow up plan:  [place]

## 2021-06-11 NOTE — Progress Notes (Signed)
Edward Cuevas:[id number] Apr 14, 1956 65 y.o.  Subjective:   Patient ID:  Edward Cuevas is a 65 y.o. (DOB [birth year]) male.  Chief Complaint: No chief complaint on file.   HPI Edward Cuevas presents to the office today for follow-up of MDD, anxiety, panic attacks, and insomnia.  Describes mood today as "ok". Pleasant. Mood symptoms - denies depression, anxiety, and irritability. Denies panic attacks. Denies worry and ruminations. Stating "I seem to be doing alright". Feels like current medications work well for him. Improved interest and motivation. Taking medications as prescribed.  Energy levels stable. Active, does not have a regular exercise routine with physical disabilities.  Enjoys some usual interests and activities. Divorced. Lives with ex wife. Has a 70 year old son locally and another son in [place]. Has a sister in [place]. Spending time with family. Appetite adequate. Weight stable - 180 pounds. Sleeps well most nights. Averages 5 to 6 hours. Focus and concentration improved. Completing tasks. Managing aspects of household. Receiving disability benefits/retirement. 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 Denies SI or HI.  Denies AH or VH.  Previous medication trials: Cymbalta, Wellbutrin     AUDIT    Flowsheet Row Office Visit from 03/24/2021 in [facility]  Alcohol Use Disorder Identification Test Final Score (AUDIT) 9      GAD-7    Flowsheet Row Office Visit from 07/14/2020 in [facility] Office Visit from 01/09/2020 in [facility] Office Visit from 11/28/2019 in [facility] Visit from 07/11/2019 in [facility] Visit from 05/16/2019 in [facility]  Total GAD-7 Score 0 6 3 0 1      PHQ2-9    [facility] Office Visit from 03/24/2021 in  [facility] Office Visit from 07/14/2020 in [facility] Office Visit from 01/09/2020 in [facility] Office Visit from 11/28/2019 in [facility] Office Visit from 07/11/2019 in [facility]  PHQ-2 Total Score 0 0 '4 6 2  '$ PHQ-9 Total Score 0 '4 10 17 5        '$ Review of Systems:  Review of Systems  Medications: I have reviewed the patient's current medications.  Current Outpatient Medications  Medication Sig Dispense Refill   acetaminophen (TYLENOL) 650 MG CR tablet Take 650 mg by mouth 2 (two) times daily.     ALPRAZolam (XANAX) 0.5 MG tablet Take 1 tablet (0.5 mg total) by mouth 2 (two) times daily. 60 tablet 2   aspirin EC 81 MG tablet Take 1 tablet (81 mg total) by mouth daily. 90 tablet 3   DULoxetine (CYMBALTA) 60 MG capsule Take 1 capsule (60 mg total) by mouth daily. 30 capsule 5   Fluticasone-Umeclidin-Vilant 200-62.5-25 MCG/INH AEPB Inhale 1 puff into the lungs daily. 60 each 5   hydrocortisone (ANUSOL-HC) 2.5 % rectal cream Place 1 application rectally 2 (two) times daily. 60 g 1   MYRBETRIQ 50 MG TB24 tablet Take 1 tablet (50 mg total) by mouth daily. 30 tablet 11   nitroGLYCERIN (NITROSTAT) 0.4 MG SL tablet Place 1 tablet (0.4 mg total) under the tongue every 5 (five) minutes as needed for chest pain. 25 tablet 3   risperiDONE (RISPERDAL) 1 MG tablet TAKE ONE TABLET  AT BEDTIME 30 tablet 5   rosuvastatin (CRESTOR) 10 MG tablet Take 1 tablet (10 mg total) by mouth daily. 30 tablet 2   Sofosbuvir-Velpatasvir (EPCLUSA) 400-100 MG TABS Take 1 tablet by mouth daily. 28 tablet 2   tamsulosin (FLOMAX) 0.4 MG CAPS capsule Take 1 capsule (0.4 mg total) by mouth at bedtime. 30 capsule 11   TRELEGY ELLIPTA 100-62.5-25 MCG/INH AEPB INHALE 1 PUFF INTO THE LUNGS DAILY 60 each 5   No current facility-administered medications  for this visit.    Medication Side Effects: None  Allergies:  Allergies  Allergen Reactions   Abilify [Aripiprazole]     Feel weird.    Penicillins Itching and Rash    Has patient had a PCN reaction causing immediate rash, facial/tongue/throat swelling, SOB or lightheadedness with hypotension: Yes Has patient had a PCN reaction causing severe rash involving mucus membranes or skin necrosis: No Has patient had a PCN reaction that required hospitalization No Has patient had a PCN reaction occurring within the last 10 years: No If all of the above answers are "NO", then may proceed with Cephalosporin use.     Past Medical History:  Diagnosis Date   Anxiety    Arthritis    Cancer ([place])    skin   COPD (chronic obstructive pulmonary disease) ([place])    per pt ?   Depression    Hepatitis    A,B,C   Hyperlipidemia    Hypertension    Prediabetes    Vitamin D deficiency     Past Medical History, Surgical history, Social history, and Family history were reviewed and updated as appropriate.   Please see review of systems for further details on the patient's review from today.   Objective:   Physical Exam:  There were no vitals taken for this visit.  Physical Exam  Lab Review:     Component Value Date/Time   NA 137 04/27/2021 1355   NA 136 07/16/2019 1151   K 5.1 04/27/2021 1355   CL 103 04/27/2021 1355   CO2 32 04/27/2021 1355   GLUCOSE 103 (H) 04/27/2021 1355   BUN 11 04/27/2021 1355   BUN 10 07/16/2019 1151   CREATININE 0.90 04/27/2021 1355   CALCIUM 9.6 04/27/2021 1355   PROT 7.1 04/27/2021 1355   PROT 7.2 07/16/2019 1151   ALBUMIN 4.6 07/16/2019 1151   AST 21 04/27/2021 1355   ALT 13 04/27/2021 1355   ALT 77 (H) 03/09/2021 1421   ALKPHOS 60 07/16/2019 1151   BILITOT 0.4 04/27/2021 1355   BILITOT 0.2 07/16/2019 1151   GFRNONAA 83 02/25/2021 1340   GFRAA 96 02/25/2021 1340       Component Value Date/Time   WBC 4.3 03/09/2021 1421   RBC 5.79 03/09/2021  1421   HGB 18.7 (H) 03/09/2021 1421   HCT 54.1 (H) 03/09/2021 1421   PLT 147 03/09/2021 1421   MCV 93.4 03/09/2021 1421   MCH 32.3 03/09/2021 1421   MCHC 34.6 03/09/2021 1421   RDW 12.5 03/09/2021 1421   LYMPHSABS 2,627 04/10/2019 0811   MONOABS 0.4 09/19/2014 1716   EOSABS 81 04/10/2019 0811   BASOSABS 58 04/10/2019 0811    No results found for: POCLITH, LITHIUM   No results found for: PHENYTOIN, PHENOBARB, VALPROATE, CBMZ   .res Assessment: Plan:    Plan:  PDMP reviewed  1. Cymbalta '60mg'$  daily   2. Risperdal '1mg'$  at hs  3. Xanax 0.'5mg'$  at bedtime to BID  Read and reviewed note with  patient for accuracy.   RTC 6 months  Patient advised to contact office with any questions, adverse effects, or acute worsening in signs and symptoms.  Discussed potential metabolic side effects associated with atypical antipsychotics, as well as potential risk for movement side effects. Advised pt to contact office if movement side effects occur.    Diagnoses and all orders for this visit:  Moderate episode of recurrent major depressive disorder (HCC) -     DULoxetine (CYMBALTA) 60 MG capsule; Take 1 capsule (60 mg total) by mouth daily.  Generalized anxiety disorder -     DULoxetine (CYMBALTA) 60 MG capsule; Take 1 capsule (60 mg total) by mouth daily.  Insomnia, unspecified type -     risperiDONE (RISPERDAL) 1 MG tablet; TAKE ONE TABLET AT BEDTIME  Panic attacks -     ALPRAZolam (XANAX) 0.5 MG tablet; Take 1 tablet (0.5 mg total) by mouth 2 (two) times daily.    Please see After Visit Summary for patient specific instructions.  Future Appointments  Date Time Provider [place]  06/23/2021  1:30 PM [facility] PREVISIT RM 50 [facility]  06/29/2021  1:45 PM [place] Circle, FNP [facility]  07/07/2021 11:00 AM Loletha Carrow, Estill Cotta III, MD [facility]  07/24/2021  2:00 PM [facility] PHARMACIST [facility] None  12/14/2021 12:00 PM Braylon Lemmons, Berdie Ogren, NP CP-CP None     No orders of the defined types were placed in this encounter.   -------------------------------

## 2021-06-16 ENCOUNTER — Other Ambulatory Visit (HOSPITAL_COMMUNITY): Payer: Self-pay

## 2021-06-23 ENCOUNTER — Other Ambulatory Visit: Payer: Self-pay

## 2021-06-23 ENCOUNTER — Ambulatory Visit (AMBULATORY_SURGERY_CENTER): Payer: Self-pay | Admitting: *Deleted

## 2021-06-23 VITALS — Ht 71.0 in | Wt 188.0 lb

## 2021-06-23 DIAGNOSIS — R195 Other fecal abnormalities: Secondary | ICD-10-CM

## 2021-06-23 MED ORDER — PEG 3350-KCL-NA BICARB-NACL 420 G PO SOLR: 4000.0000 mL | Freq: Once | ORAL | 0 refills | Status: AC

## 2021-06-23 NOTE — Progress Notes (Signed)

## 2021-06-29 ENCOUNTER — Ambulatory Visit (INDEPENDENT_AMBULATORY_CARE_PROVIDER_SITE_OTHER): Payer: PPO | Admitting: Family

## 2021-06-29 ENCOUNTER — Encounter: Payer: Self-pay | Admitting: Family

## 2021-06-29 ENCOUNTER — Other Ambulatory Visit: Payer: Self-pay

## 2021-06-29 VITALS — BP 114/72 | HR 81 | Temp 97.9°F | Ht 71.0 in | Wt 188.0 lb

## 2021-06-29 DIAGNOSIS — B182 Chronic viral hepatitis C: Secondary | ICD-10-CM | POA: Diagnosis not present

## 2021-06-29 NOTE — Addendum Note (Signed)
Addended by: Mauricio Po D on: 06/29/2021 02:12 PM   Modules accepted: Orders

## 2021-06-29 NOTE — Patient Instructions (Signed)
Nice to see you.  Previous lab work looks good.   We will check your lab work today.  Plan for lab visit in 3 months with telehealth or in person visit 2 weeks after blood work.   Have a great day and stay safe!

## 2021-06-29 NOTE — Assessment & Plan Note (Signed)
Edward Cuevas completed his 12 weeks of Epclusa without complication. Overall feeling well. Discussed plan of care to check RNA level today and then again in 3 months to confirm sustain viremic response. He did have a higher fibrosis score and will require [place] screening through ultrasound with or without alphafetoprotein every six months as he is at risk for [facility]. Plan for follow up in 3 months or sooner if needed.

## 2021-06-29 NOTE — Progress Notes (Signed)
Subjective:    Patient ID: Edward Cuevas, male    DOB: 12-14-1955, 65 y.o.   MRN: [id number]  Chief Complaint  Patient presents with   Follow-up    HPI:  Edward Cuevas is a 65 y.o. male with chronic Hepatitis C last seen on 04/27/21 by Edward [facility]vas for 1 month follow up and found to have good adherence and tolerance to his Epclusa. Hepatitis C RNA level was undetectable. Here today for end of treatment visit.   Edward C[facility]vas finished his [facility] about 1 week ago with no adverse side effects or missed doses. Overall feeling well with no symptoms and denies abdominal pain, nausea, vomiting, diarrhea, fatigue, scleral icterus or jaundice   Allergies  Allergen Reactions   Abilify [Aripiprazole]     Feel weird.    Penicillins Itching and Rash    Has patient had a PCN reaction causing immediate rash, facial/tongue/throat swelling, SOB or lightheadedness with hypotension: Yes Has patient had a PCN reaction causing severe rash involving mucus membranes or skin necrosis: No Has patient had a PCN reaction that required hospitalization No Has patient had a PCN reaction occurring within the last 10 years: No If all of the above answers are "NO", then may proceed with Cephalosporin use.       Outpatient Medications Prior to Visit  Medication Sig Dispense Refill   acetaminophen (TYLENOL) 650 MG CR tablet Take 650 mg by mouth 2 (two) times daily.     ALPRAZolam (XANAX) 0.5 MG tablet Take 1 tablet (0.5 mg total) by mouth 2 (two) times daily. 60 tablet 2   aspirin EC 81 MG tablet Take 1 tablet (81 mg total) by mouth daily. 90 tablet 3   DULoxetine (CYMBALTA) 60 MG capsule Take 1 capsule (60 mg total) by mouth daily. 30 capsule 5   Fluticasone-Umeclidin-Vilant 200-62.5-25 MCG/INH AEPB Inhale 1 puff into the lungs daily. 60 each 5   hydrocortisone (ANUSOL-HC) 2.5 % rectal cream Place 1 application rectally 2 (two) times daily. 60 g 1   MYRBETRIQ 50 MG TB24 tablet Take 1 tablet (50 mg total) by  mouth daily. 30 tablet 11   risperiDONE (RISPERDAL) 1 MG tablet TAKE ONE TABLET AT BEDTIME 30 tablet 5   rosuvastatin (CRESTOR) 40 MG tablet Take 40 mg by mouth daily.     tamsulosin (FLOMAX) 0.4 MG CAPS capsule Take 1 capsule (0.4 mg total) by mouth at bedtime. 30 capsule 11   TRELEGY ELLIPTA 100-62.5-25 MCG/INH AEPB INHALE 1 PUFF INTO THE LUNGS DAILY 60 each 5   nitroGLYCERIN (NITROSTAT) 0.4 MG SL tablet Place 1 tablet (0.4 mg total) under the tongue every 5 (five) minutes as needed for chest pain. 25 tablet 3   No facility-administered medications prior to visit.     Past Medical History:  Diagnosis Date   Anxiety    Arthritis    Cancer ([facility])    skin   COPD (chronic obstructive pulmonary disease) ([place])    per pt ?   Depression    Hepatitis    A,B,C   Hyperlipidemia    Hypertension    Prediabetes    Vitamin D deficiency      Past Surgical History:  Procedure Laterality Date   COLONOSCOPY  2011   [facility]   ELBOW SURGERY Right 10/18/1984   HEMORRHOID SURGERY  1977 and [facility] Left 10/18/1994   INGUINAL HERNIA REPAIR Right 12/30/2015   Procedure: RIGHT RIGHT INGUINAL HERNIA REPAIR;  Surgeon: Edward Ok, MD;  Location: [facility];  Service: General;  Laterality: Right;   INSERTION OF MESH Right 12/30/2015   Procedure: INSERTION OF MESH;  Surgeon: Edward Ok, MD;  Location: [facility];  Service: General;  Laterality: Right;   POLYPECTOMY  2011   [facility]   SHOULDER SURGERY Left 10/18/2005       Review of Systems  Constitutional:  Negative for chills, diaphoresis, fatigue and fever.  Respiratory:  Negative for cough, chest tightness, shortness of breath and wheezing.   Cardiovascular:  Negative for chest pain.  Gastrointestinal:  Negative for abdominal distention, abdominal pain, constipation, diarrhea, nausea and vomiting.  Neurological:  Negative for weakness and headaches.  Hematological:  Does not bruise/bleed easily.     Objective:    BP 114/72   Pulse  81   Temp 97.9 F (36.6 C) (Oral)   Ht '5\' 11"'$  (1.803 m)   Wt 188 lb (85.3 kg)   SpO2 92%   BMI 26.22 kg/m  Nursing note and vital signs reviewed.  Physical Exam Constitutional:      General: He is not in acute distress.    Appearance: He is well-developed.  Cardiovascular:     Rate and Rhythm: Normal rate and regular rhythm.     Heart sounds: Normal heart sounds. No murmur heard.   No friction rub. No gallop.  Pulmonary:     Effort: Pulmonary effort is normal. No respiratory distress.     Breath sounds: Normal breath sounds. No wheezing or rales.  Chest:     Chest wall: No tenderness.  Abdominal:     General: Bowel sounds are normal. There is no distension.     Palpations: Abdomen is soft. There is no mass.     Tenderness: There is no abdominal tenderness. There is no guarding or rebound.  Skin:    General: Skin is warm and dry.  Neurological:     Mental Status: He is alert and oriented to person, place, and time.  Psychiatric:        Behavior: Behavior normal.        Thought Content: Thought content normal.        Judgment: Judgment normal.     Depression screen [facility] 2/9 06/29/2021 03/24/2021 07/14/2020 01/09/2020 11/28/2019  Decreased Interest 0 0 0 2 3  Down, Depressed, Hopeless 0 0 0 2 3  PHQ - 2 Score 0 0 0 4 6  Altered sleeping - 0 1 0 3  Tired, decreased energy - 0 '2 2 3  '$ Change in appetite - 0 0 0 0  Feeling bad or failure about yourself  - 0 '1 2 3  '$ Trouble concentrating - 0 0 2 2  Moving slowly or fidgety/restless - 0 0 0 0  Suicidal thoughts - 0 0 0 0  PHQ-9 Score - 0 '4 10 17  '$ Difficult doing work/chores - Not difficult at all Not difficult at all - Very difficult       Assessment & Plan:    Patient Active Problem List   Diagnosis Date Noted   Positive colorectal cancer screening using Cologuard test 03/10/2021   Chronic hepatitis C without hepatic coma ([place]) 03/09/2021   Hyperlipidemia    Hepatitis    Depression    COPD (chronic obstructive pulmonary  disease) ([facility])    Cancer ([facility])    Arthritis    Anxiety    OAB (overactive bladder) 07/12/2019   Dyspnea on exertion 05/31/2019   Centrilobular emphysema ([facility]) 05/16/2019   Moderate episode of  recurrent major depressive disorder ([place]) 05/16/2019   Aortic atherosclerosis ([place]) 05/16/2019   Coronary artery disease due to calcified coronary lesion 05/16/2019   Pulmonary nodules 05/16/2019   Adrenal adenoma 04/17/2019   Carotid stenosis, right 04/17/2019   Abdominal distension (gaseous) 04/11/2019   Right carotid bruit 04/09/2019   Right lower quadrant abdominal pain 04/09/2019   Current smoker 04/09/2019   Lumbar radiculopathy 01/18/2017   Chronic obstructive pulmonary disease ([place]) 12/29/2016   Fatty liver 10/01/2016   BPH without obstruction/lower urinary tract symptoms 11/25/2015   Chronic pain of multiple joints 11/25/2015   Actinic keratosis 06/27/2015   GAD (generalized anxiety disorder) 11/01/2014   History of hepatitis C 10/08/2014   Right inguinal hernia 10/08/2014   Panic attacks 10/04/2014   DJD 10/23/2013   DDD (degenerative disc disease), lumbosacral 10/23/2013   Mixed hyperlipidemia    Hypertension    Prediabetes    Vitamin D deficiency      Problem List Items Addressed This Visit       Digestive   Chronic hepatitis C without hepatic coma ([place]) - Primary    Mr. Raygoza completed his 12 weeks of Epclusa without complication. Overall feeling well. Discussed plan of care to check RNA level today and then again in 3 months to confirm sustain viremic response. He did have a higher fibrosis score and will require [place] screening through ultrasound with or without alphafetoprotein every six months as he is at risk for [facility]. Plan for follow up in 3 months or sooner if needed.       Relevant Orders   Hepatitis C RNA quantitative     I am having Lynford Citizen maintain his acetaminophen, aspirin EC, nitroGLYCERIN, Fluticasone-Umeclidin-Vilant, Myrbetriq, hydrocortisone,  tamsulosin, Trelegy Ellipta, DULoxetine, risperiDONE, ALPRAZolam, and rosuvastatin.   Follow-up: Return in about 3 months (around 09/28/2021), or if symptoms worsen or fail to improve.   Terri Piedra, MSN, FNP-C Nurse Practitioner [facility] [place] number: [phone]

## 2021-07-01 LAB — HEPATITIS C RNA QUANTITATIVE
HCV Quantitative Log: <1.18 log IU/mL
HCV RNA, PCR, QN: <15 IU/mL

## 2021-07-07 ENCOUNTER — Other Ambulatory Visit: Payer: Self-pay | Admitting: Physician Assistant

## 2021-07-07 ENCOUNTER — Encounter: Payer: Self-pay | Admitting: Gastroenterology

## 2021-07-07 ENCOUNTER — Ambulatory Visit (AMBULATORY_SURGERY_CENTER): Payer: PPO | Admitting: Gastroenterology

## 2021-07-07 ENCOUNTER — Other Ambulatory Visit: Payer: Self-pay

## 2021-07-07 VITALS — BP 146/82 | HR 69 | Temp 98.2°F | Resp 19 | Ht 70.0 in | Wt 188.0 lb

## 2021-07-07 DIAGNOSIS — K5730 Diverticulosis of large intestine without perforation or abscess without bleeding: Secondary | ICD-10-CM

## 2021-07-07 DIAGNOSIS — K648 Other hemorrhoids: Secondary | ICD-10-CM

## 2021-07-07 DIAGNOSIS — R195 Other fecal abnormalities: Secondary | ICD-10-CM | POA: Diagnosis not present

## 2021-07-07 DIAGNOSIS — D128 Benign neoplasm of rectum: Secondary | ICD-10-CM | POA: Diagnosis not present

## 2021-07-07 DIAGNOSIS — N3281 Overactive bladder: Secondary | ICD-10-CM

## 2021-07-07 MED ORDER — SODIUM CHLORIDE 0.9 % IV SOLN: 500.0000 mL | Freq: Once | INTRAVENOUS | Status: DC

## 2021-07-07 NOTE — Progress Notes (Signed)
To pacu, VSS. Report to [email or web address] 

## 2021-07-07 NOTE — Patient Instructions (Signed)
YOU HAD AN ENDOSCOPIC PROCEDURE TODAY AT [facility]:   Refer to the procedure report that was given to you for any specific questions about what was found during the examination.  If the procedure report does not answer your questions, please call your gastroenterologist to clarify.  If you requested that your care partner not be given the details of your procedure findings, then the procedure report has been included in a sealed envelope for you to review at your convenience later.  YOU SHOULD EXPECT: Some feelings of bloating in the abdomen. Passage of more gas than usual.  Walking can help get rid of the air that was put into your GI tract during the procedure and reduce the bloating. If you had a lower endoscopy (such as a colonoscopy or flexible sigmoidoscopy) you may notice spotting of blood in your stool or on the toilet paper. If you underwent a bowel prep for your procedure, you may not have a normal bowel movement for a few days.  Please Note:  You might notice some irritation and congestion in your nose or some drainage.  This is from the oxygen used during your procedure.  There is no need for concern and it should clear up in a day or so.  SYMPTOMS TO REPORT IMMEDIATELY:  Following lower endoscopy (colonoscopy or flexible sigmoidoscopy):  Excessive amounts of blood in the stool  Significant tenderness or worsening of abdominal pains  Swelling of the abdomen that is new, acute  Fever of 100F or higher    For urgent or emergent issues, a gastroenterologist can be reached at any hour by calling [phone]. Do not use MyChart messaging for urgent concerns.    DIET:  We do recommend a small meal at first, but then you may proceed to your regular diet.  Drink plenty of fluids but you should avoid alcoholic beverages for 24 hours.  ACTIVITY:  You should plan to take it easy for the rest of today and you should NOT DRIVE or use heavy machinery until tomorrow (because  of the sedation medicines used during the test).    FOLLOW UP: Our staff will call the number listed on your records 48-72 hours following your procedure to check on you and address any questions or concerns that you may have regarding the information given to you following your procedure. If we do not reach you, we will leave a message.  We will attempt to reach you two times.  During this call, we will ask if you have developed any symptoms of COVID 19. If you develop any symptoms (ie: fever, flu-like symptoms, shortness of breath, cough etc.) before then, please call [phone].  If you test positive for Covid 19 in the 2 weeks post procedure, please call and report this information to [place].    If any biopsies were taken you will be contacted by phone or by letter within the next 1-3 weeks.  Please call us at [phone] if you have not heard about the biopsies in 3 weeks.    SIGNATURES/CONFIDENTIALITY: You and/or your care partner have signed paperwork which will be entered into your electronic medical record.  These signatures attest to the fact that that the information above on your After Visit Summary has been reviewed and is understood.  Full responsibility of the confidentiality of this discharge information lies with you and/or your care-partner.    Resume medications. Information given on polyps,diverticulosis and hemorrhoids.

## 2021-07-07 NOTE — Progress Notes (Signed)
Called to room to assist during endoscopic procedure.  Patient ID and intended procedure confirmed with present staff. Received instructions for my participation in the procedure from the performing physician.  

## 2021-07-07 NOTE — Progress Notes (Signed)
History and Physical:  This patient presents for endoscopic testing for: Encounter Diagnosis  Name Primary?   Positive colorectal cancer screening using Cologuard test Yes    Patient without complaints today. Last colonoscopy 05/2010 Positive cologuard in 02/2021    Past Medical History: Past Medical History:  Diagnosis Date   Anxiety    Arthritis    Cancer ([place])    skin   COPD (chronic obstructive pulmonary disease) ([place])    per pt ?   Depression    Hepatitis    A,B,C   Hyperlipidemia    Hypertension    Prediabetes    Vitamin D deficiency      Past Surgical History: Past Surgical History:  Procedure Laterality Date   COLONOSCOPY  2011   [facility]   ELBOW SURGERY Right 10/18/1984   HEMORRHOID SURGERY  1977 and [place] Left 10/18/1994   INGUINAL HERNIA REPAIR Right 12/30/2015   Procedure: RIGHT RIGHT INGUINAL HERNIA REPAIR;  Surgeon: Ralene Ok, MD;  Location: [facility];  Service: General;  Laterality: Right;   INSERTION OF MESH Right 12/30/2015   Procedure: INSERTION OF MESH;  Surgeon: Ralene Ok, MD;  Location: [place];  Service: General;  Laterality: Right;   POLYPECTOMY  2011   [facility]   SHOULDER SURGERY Left 10/18/2005    Allergies: Allergies  Allergen Reactions   Abilify [Aripiprazole]     Feel weird.    Penicillins Itching and Rash    Has patient had a PCN reaction causing immediate rash, facial/tongue/throat swelling, SOB or lightheadedness with hypotension: Yes Has patient had a PCN reaction causing severe rash involving mucus membranes or skin necrosis: No Has patient had a PCN reaction that required hospitalization No Has patient had a PCN reaction occurring within the last 10 years: No If all of the above answers are "NO", then may proceed with Cephalosporin use.     Outpatient Meds: Current Outpatient Medications  Medication Sig Dispense Refill   acetaminophen (TYLENOL) 650 MG CR tablet Take 650 mg by mouth 2 (two) times daily.      ALPRAZolam (XANAX) 0.5 MG tablet Take 1 tablet (0.5 mg total) by mouth 2 (two) times daily. 60 tablet 2   DULoxetine (CYMBALTA) 60 MG capsule Take 1 capsule (60 mg total) by mouth daily. 30 capsule 5   Fluticasone-Umeclidin-Vilant 200-62.5-25 MCG/INH AEPB Inhale 1 puff into the lungs daily. 60 each 5   MYRBETRIQ 50 MG TB24 tablet TAKE 1 TABLET(50 MG) BY MOUTH DAILY 90 tablet 1   risperiDONE (RISPERDAL) 1 MG tablet TAKE ONE TABLET AT BEDTIME 30 tablet 5   rosuvastatin (CRESTOR) 40 MG tablet Take 40 mg by mouth daily.     tamsulosin (FLOMAX) 0.4 MG CAPS capsule TAKE 1 CAPSULE(0.4 MG) BY MOUTH AT BEDTIME 90 capsule 1   TRELEGY ELLIPTA 100-62.5-25 MCG/INH AEPB INHALE 1 PUFF INTO THE LUNGS DAILY 60 each 5   aspirin EC 81 MG tablet Take 1 tablet (81 mg total) by mouth daily. 90 tablet 3   hydrocortisone (ANUSOL-HC) 2.5 % rectal cream Place 1 application rectally 2 (two) times daily. (Patient not taking: Reported on 07/07/2021) 60 g 1   nitroGLYCERIN (NITROSTAT) 0.4 MG SL tablet Place 1 tablet (0.4 mg total) under the tongue every 5 (five) minutes as needed for chest pain. 25 tablet 3   Current Facility-Administered Medications  Medication Dose Route Frequency Provider Last Rate Last Admin   0.9 %  sodium chloride infusion  500 mL Intravenous Once Wilfrid Lund  L III, MD          ___________________________________________________________________ Objective   Exam:  BP 126/63   Pulse 77   Temp 98.2 F (36.8 C)   Ht 5\' 10"  (1.778 m)   Wt 188 lb (85.3 kg)   SpO2 93%   BMI 26.98 kg/m   CV: RRR without murmur, S1/S2 Resp: clear to auscultation bilaterally, normal RR and effort noted GI: soft, no tenderness, with active bowel sounds.   Assessment: Encounter Diagnosis  Name Primary?   Positive colorectal cancer screening using Cologuard test Yes     Plan: Colonoscopy  The benefits and risks of the planned procedure were described in detail with the patient or (when appropriate) their  health care proxy.  Risks were outlined as including, but not limited to, bleeding, infection, perforation, adverse medication reaction leading to cardiac or pulmonary decompensation, pancreatitis (if ERCP).  The limitation of incomplete mucosal visualization was also discussed.  No guarantees or warranties were given.    The patient is appropriate for an endoscopic procedure in the ambulatory setting.   - Wilfrid Lund, MD

## 2021-07-07 NOTE — Op Note (Signed)
[facility] Patient Name: Edward [facility] Procedure Date: 07/07/2021 10:35 AM MRN: [id number] Endoscopist: Mallie Mussel L. Loletha Carrow , MD Age: 65 Referring MD:  Date of Birth: May 04, 1956 Gender: Male Account #: [id number] Procedure:                Colonoscopy Indications:              Positive Cologuard test (02/2021)                           last colonoscopy 05/2010 with hyperplastic polyp Medicines:                Monitored Anesthesia Care Procedure:                Pre-Anesthesia Assessment:                           - Prior to the procedure, a History and Physical                            was performed, and patient medications and                            allergies were reviewed. The patient's tolerance of                            previous anesthesia was also reviewed. The risks                            and benefits of the procedure and the sedation                            options and risks were discussed with the patient.                            All questions were answered, and informed consent                            was obtained. Prior Anticoagulants: The patient has                            taken no previous anticoagulant or antiplatelet                            agents. ASA Grade Assessment: III - A patient with                            severe systemic disease. After reviewing the risks                            and benefits, the patient was deemed in                            satisfactory condition to undergo the procedure.  After obtaining informed consent, the colonoscope                            was passed under direct vision. Throughout the                            procedure, the patient's blood pressure, pulse, and                            oxygen saturations were monitored continuously. The                            Olympus CF-HQ190L [id number] [id number]) Colonoscope was                            introduced through the anus and  advanced to the the                            cecum, identified by appendiceal orifice and                            ileocecal valve. The colonoscopy was performed with                            difficulty due to a redundant colon and significant                            looping. Successful completion of the procedure was                            aided by using manual pressure and straightening                            and shortening the scope to obtain bowel loop                            reduction. The patient tolerated the procedure                            well. The quality of the bowel preparation was                            good. The ileocecal valve, appendiceal orifice, and                            rectum were photographed. Scope In: 10:44:47 AM Scope Out: 11:12:07 AM Scope Withdrawal Time: 0 hours 17 minutes 4 seconds  Total Procedure Duration: 0 hours 27 minutes 20 seconds  Findings:                 The perianal and digital rectal examinations were                            normal.  Multiple diverticula were found in the right colon.                           A 4 mm polyp was found in the proximal rectum. The                            polyp was sessile. The polyp was removed with a                            cold snare. Resection and retrieval were complete.                           Anal papilla(e) were hypertrophied.                           Internal hemorrhoids were found.                           The exam was otherwise without abnormality on                            direct and retroflexion views. Complications:            No immediate complications. Estimated Blood Loss:     Estimated blood loss was minimal. Impression:               - Diverticulosis in the right colon.                           - One 4 mm polyp in the proximal rectum, removed                            with a cold snare. Resected and retrieved.                            - Anal papilla(e) were hypertrophied.                           - Internal hemorrhoids.                           - The examination was otherwise normal on direct                            and retroflexion views. Recommendation:           - Patient has a contact number available for                            emergencies. The signs and symptoms of potential                            delayed complications were discussed with the                            patient. Return to normal activities  tomorrow.                            Written discharge instructions were provided to the                            patient.                           - Resume previous diet.                           - Continue present medications.                           - Await pathology results.                           - Repeat colonoscopy is recommended for                            surveillance. The colonoscopy date will be                            determined after pathology results from today's                            exam become available for review. Piers Baade L. Loletha Carrow, MD 07/07/2021 11:19:05 AM This report has been signed electronically.

## 2021-07-09 ENCOUNTER — Encounter: Payer: Self-pay | Admitting: Gastroenterology

## 2021-07-20 ENCOUNTER — Telehealth: Payer: Self-pay | Admitting: Pharmacist

## 2021-07-20 NOTE — Chronic Care Management (AMB) (Signed)
    Chronic Care Management Pharmacy Assistant   Name: Edward [facility]  MRN: [id number] DOB: January 11, 1956  Edward C[facility]vas is an 65 y.o. year old male who presents for his initial CCM visit with the clinical pharmacist.  Recent office visits:  03/24/21 [place] Planas PA-C PCP- pt was seen for his annual medicare wellness exam. No labs or medication changes were made. Pt advised to f/u as planned.  Recent consult visits:  06/29/21 [facility] FNP Infectious Disease- pt was seen for Chronic hepatitis C without hepatic coma. Labs were ordered and pt advised to f/u in 3 months.  03/09/21 [facility] FNP Infectious Disease- pt was seen for Chronic hepatitis C without hepatic coma. Labs were ordered and pt advised to f/u Pending blood work and 1 month after start of medication.   Hospital visits:  None in previous 6 months  Medications: Outpatient Encounter Medications as of 07/20/2021  Medication Sig   acetaminophen (TYLENOL) 650 MG CR tablet Take 650 mg by mouth 2 (two) times daily.   ALPRAZolam (XANAX) 0.5 MG tablet Take 1 tablet (0.5 mg total) by mouth 2 (two) times daily.   aspirin EC 81 MG tablet Take 1 tablet (81 mg total) by mouth daily.   DULoxetine (CYMBALTA) 60 MG capsule Take 1 capsule (60 mg total) by mouth daily.   Fluticasone-Umeclidin-Vilant 200-62.5-25 MCG/INH AEPB Inhale 1 puff into the lungs daily.   hydrocortisone (ANUSOL-HC) 2.5 % rectal cream Place 1 application rectally 2 (two) times daily. (Patient not taking: Reported on 07/07/2021)   MYRBETRIQ 50 MG TB24 tablet TAKE 1 TABLET(50 MG) BY MOUTH DAILY   nitroGLYCERIN (NITROSTAT) 0.4 MG SL tablet Place 1 tablet (0.4 mg total) under the tongue every 5 (five) minutes as needed for chest pain.   risperiDONE (RISPERDAL) 1 MG tablet TAKE ONE TABLET AT BEDTIME   rosuvastatin (CRESTOR) 40 MG tablet Take 40 mg by mouth daily.   tamsulosin (FLOMAX) 0.4 MG CAPS capsule TAKE 1 CAPSULE(0.4 MG) BY MOUTH AT BEDTIME   TRELEGY ELLIPTA  100-62.5-25 MCG/INH AEPB INHALE 1 PUFF INTO THE LUNGS DAILY   No facility-administered encounter medications on file as of 07/20/2021.    Current Medication List  acetaminophen (TYLENOL) 650 MG CR tablet  ALPRAZolam (XANAX) 0.5 MG tablet last filled 07/12/21 30 DS aspirin EC 81 MG tablet DULoxetine (CYMBALTA) 60 MG capsule last filled 07/10/21 30 DS Fluticasone-Ume. 200-62.5-25 MCG/INH AEPB last filled 01/15/21 30 DS hydrocortisone (ANUSOL-HC) 2.5 % rectal cream last filled 07/14/20 30 DS MYRBETRIQ 50 MG TB24 tablet last filled 07/07/2021 90 DS risperiDONE (RISPERDAL) 1 MG tablet last filled 07/10/2021 30 DS rosuvastatin (CRESTOR) 40 MG tablet last filled 06/21/2021 90 DS tamsulosin (FLOMAX) 0.4 MG CAPS capsule last filled 07/07/2021 90 DS TRELEGY ELLIPTA 100-62.5-25 MCG/INH AEPB last filled 09/19/20  [facility] Pharmacist Assistant [phone]

## 2021-07-24 ENCOUNTER — Other Ambulatory Visit: Payer: Self-pay

## 2021-07-24 ENCOUNTER — Ambulatory Visit (INDEPENDENT_AMBULATORY_CARE_PROVIDER_SITE_OTHER): Payer: PPO | Admitting: Pharmacist

## 2021-07-24 DIAGNOSIS — I10 Essential (primary) hypertension: Secondary | ICD-10-CM

## 2021-07-24 DIAGNOSIS — J42 Unspecified chronic bronchitis: Secondary | ICD-10-CM

## 2021-07-24 DIAGNOSIS — E782 Mixed hyperlipidemia: Secondary | ICD-10-CM

## 2021-07-24 NOTE — Progress Notes (Signed)
Chronic Care Management Pharmacy Note  07/24/2021 Name:  Edward Cuevas MRN:  [id number] DOB:  12-21-1955  Summary: addressed HTN, HLD, COPD  Recommendations/Changes made from today's visit: none, will coordinate with support staff to mail out patient assistance paperwork + return envelope for trelegy inhaler patient assistance.  Plan:  f/u with pharmacist in 1 month  Subjective: Edward Cuevas is an 65 y.o. year old male who is a primary patient of Alden Hipp, Royetta Car, PA-C.  The CCM team was consulted for assistance with disease management and care coordination needs.    Engaged with patient by telephone for initial visit in response to provider referral for pharmacy case management and/or care coordination services.   Consent to Services:  The patient was given information about Chronic Care Management services, agreed to services, and gave verbal consent prior to initiation of services.  Please see initial visit note for detailed documentation.   Patient Care Team: Lavada Mesi as PCP - General (Family Medicine) Darius Bump, [facility] as Pharmacist (Pharmacist)  Recent office visits:  03/24/21 Iran Planas PA-C PCP- pt was seen for his annual medicare wellness exam. No labs or medication changes were made. Pt advised to f/u as planned.   Recent consult visits:  06/29/21 Golden Circle FNP Infectious Disease- pt was seen for Chronic hepatitis C without hepatic coma. Labs were ordered and pt advised to f/u in 3 months.   03/09/21 Golden Circle FNP Infectious Disease- pt was seen for Chronic hepatitis C without hepatic coma. Labs were ordered and pt advised to f/u Pending blood work and 1 month after start of medication.    Hospital visits:  None in previous 6 months  Objective:  Lab Results  Component Value Date   CREATININE 0.90 04/27/2021   CREATININE 0.95 02/25/2021   CREATININE 0.86 07/29/2020    Lab Results  Component Value Date   HGBA1C 5.6 02/25/2021   Last  diabetic Eye exam: No results found for: HMDIABEYEEXA  Last diabetic Foot exam: No results found for: HMDIABFOOTEX      Component Value Date/Time   CHOL 130 07/29/2020 1305   CHOL 120 07/16/2019 1151   TRIG 84 07/29/2020 1305   HDL 47 07/29/2020 1305   HDL 43 07/16/2019 1151   CHOLHDL 2.8 07/29/2020 1305   VLDL 13 08/24/2016 1347   LDLCALC 67 07/29/2020 1305    Hepatic Function Latest Ref Rng & Units 04/27/2021 03/09/2021 03/09/2021  Total Protein 6.1 - 8.1 g/dL 7.1 8.0 -  Albumin 3.8 - 4.8 g/dL - - -  AST 10 - 35 U/L 21 54(H) -  ALT 9 - 46 U/L 13 78(H) 77(H)  Alk Phosphatase 39 - 117 IU/L - - -  Total Bilirubin 0.2 - 1.2 mg/dL 0.4 0.5 -  Bilirubin, Direct 0.0 - 0.2 mg/dL - 0.2 -    Lab Results  Component Value Date/Time   TSH 2.88 04/10/2019 08:11 AM   TSH 1.537 09/19/2014 05:16 PM    CBC Latest Ref Rng & Units 03/09/2021 04/10/2019 12/26/2015  WBC 3.8 - 10.8 Thousand/uL 4.3 5.8 6.0  Hemoglobin 13.2 - 17.1 g/dL 18.7(H) 17.1 15.8  Hematocrit 38.5 - 50.0 % 54.1(H) 48.7 45.3  Platelets 140 - 400 Thousand/uL 147 198 163    Lab Results  Component Value Date/Time   VD25OH 40 09/19/2014 05:16 PM   VD25OH 24 (L) 04/08/2014 02:36 PM    Clinical ASCVD: Yes  The 10-year ASCVD risk score (Arnett DK, et al., 2019)  is: 17.8%   Values used to calculate the score:     Age: 8 years     Sex: Male     Is Non-Hispanic African American: No     Diabetic: No     Tobacco smoker: Yes     Systolic Blood Pressure: 876 mmHg     Is BP treated: No     HDL Cholesterol: 47 mg/dL     Total Cholesterol: 130 mg/dL    Other: (CHADS2VASc if Afib, PHQ9 if depression, MMRC or CAT for COPD, ACT, DEXA)  Social History   Tobacco Use  Smoking Status Every Day   Packs/day: 1.00   Years: 30.00   Pack years: 30.00   Types: Cigarettes  Smokeless Tobacco Never   BP Readings from Last 3 Encounters:  07/07/21 (!) 146/82  06/29/21 114/72  03/09/21 114/71   Pulse Readings from Last 3 Encounters:   07/07/21 69  06/29/21 81  03/09/21 76   Wt Readings from Last 3 Encounters:  07/07/21 188 lb (85.3 kg)  06/29/21 188 lb (85.3 kg)  06/23/21 188 lb (85.3 kg)    Assessment: Review of patient past medical history, allergies, medications, health status, including review of consultants reports, laboratory and other test data, was performed as part of comprehensive evaluation and provision of chronic care management services.   SDOH:  (Social Determinants of Health) assessments and interventions performed:    CCM Care Plan  Allergies  Allergen Reactions   Abilify [Aripiprazole]     Feel weird.    Penicillins Itching and Rash    Has patient had a PCN reaction causing immediate rash, facial/tongue/throat swelling, SOB or lightheadedness with hypotension: Yes Has patient had a PCN reaction causing severe rash involving mucus membranes or skin necrosis: No Has patient had a PCN reaction that required hospitalization No Has patient had a PCN reaction occurring within the last 10 years: No If all of the above answers are "NO", then may proceed with Cephalosporin use.     Medications Reviewed Today     Reviewed by Laretta Alstrom, CRNA (Certified Registered Nurse Anesthetist) on 07/07/21 at 73  Med List Status: <None>   Medication Order Taking? Sig Documenting Provider Last Dose Status Informant  0.9 %  sodium chloride infusion [id number]   Nelida Meuse III, MD  Active   acetaminophen (TYLENOL) 650 MG CR tablet [id number] Yes Take 650 mg by mouth 2 (two) times daily. [provider] 07/07/2021 Active   ALPRAZolam Duanne Moron) 0.5 MG tablet [id number] Yes Take 1 tablet (0.5 mg total) by mouth 2 (two) times daily. Mozingo, Berdie Ogren, [place] 07/07/2021 Active   aspirin EC 81 MG tablet [id number] No Take 1 tablet (81 mg total) by mouth daily. Richardo Priest, MD Unknown Active   DULoxetine (CYMBALTA) 60 MG capsule [id number] Yes Take 1 capsule (60 mg total) by mouth daily. Bonney Roussel, [place] 07/07/2021 Active   Fluticasone-Umeclidin-Vilant 200-62.5-25 MCG/INH AEPB [id number] Yes Inhale 1 puff into the lungs daily. Donella Stade, PA-C 07/07/2021 Active   hydrocortisone (ANUSOL-HC) 2.5 % rectal cream [id number] No Place 1 application rectally 2 (two) times daily.  Patient not taking: Reported on 07/07/2021   Lavada Mesi Not Taking Active   MYRBETRIQ 50 MG TB24 tablet [id number] Yes TAKE 1 TABLET(50 MG) BY MOUTH DAILY Donella Stade, PA-C 07/07/2021 Active   nitroGLYCERIN (NITROSTAT) 0.4 MG SL tablet [id number]  Place 1 tablet (0.4 mg total) under the tongue every 5 (five) minutes  as needed for chest pain. Richardo Priest, MD  Expired 06/23/21 2359   risperiDONE (RISPERDAL) 1 MG tablet [id number] Yes TAKE ONE TABLET AT BEDTIME Mozingo, Berdie Ogren, NP 07/06/2021 Active   rosuvastatin (CRESTOR) 40 MG tablet [id number] Yes Take 40 mg by mouth daily. [provider] 07/07/2021 Active   tamsulosin (FLOMAX) 0.4 MG CAPS capsule [id number] Yes TAKE 1 CAPSULE(0.4 MG) BY MOUTH AT BEDTIME Donella Stade, PA-C 07/06/2021 Active   TRELEGY ELLIPTA 100-62.5-25 MCG/INH AEPB [id number] Yes INHALE 1 PUFF INTO THE LUNGS DAILY Donella Stade, PA-C 07/07/2021 Active             Patient Active Problem List   Diagnosis Date Noted   Positive colorectal cancer screening using Cologuard test 03/10/2021   Chronic hepatitis C without hepatic coma ([place]) 03/09/2021   Hyperlipidemia    Hepatitis    Depression    COPD (chronic obstructive pulmonary disease) ([place])    Cancer (HCC)    Arthritis    Anxiety    OAB (overactive bladder) 07/12/2019   Dyspnea on exertion 05/31/2019   Centrilobular emphysema ([place]) 05/16/2019   Moderate episode of recurrent major depressive disorder ([place]) 05/16/2019   Aortic atherosclerosis ([place]) 05/16/2019   Coronary artery disease due to calcified coronary lesion 05/16/2019   Pulmonary nodules 05/16/2019   Adrenal adenoma 04/17/2019    Carotid stenosis, right 04/17/2019   Abdominal distension (gaseous) 04/11/2019   Right carotid bruit 04/09/2019   Right lower quadrant abdominal pain 04/09/2019   Current smoker 04/09/2019   Lumbar radiculopathy 01/18/2017   Chronic obstructive pulmonary disease ([place]) 12/29/2016   Fatty liver 10/01/2016   BPH without obstruction/lower urinary tract symptoms 11/25/2015   Chronic pain of multiple joints 11/25/2015   Actinic keratosis 06/27/2015   GAD (generalized anxiety disorder) 11/01/2014   History of hepatitis C 10/08/2014   Right inguinal hernia 10/08/2014   Panic attacks 10/04/2014   DJD 10/23/2013   DDD (degenerative disc disease), lumbosacral 10/23/2013   Mixed hyperlipidemia    Hypertension    Prediabetes    Vitamin D deficiency     Immunization History  Administered Date(s) Administered   Fluad Quad(high Dose 65+) 07/14/2020   Influenza,inj,Quad PF,6+ Mos 07/11/2019   Moderna Sars-Covid-2 Vaccination 12/25/2019, 01/22/2020, 07/07/2020   Pneumococcal Polysaccharide-23 04/07/2009   Tdap 04/12/2011    Conditions to be addressed/monitored: HTN, HLD, and COPD  There are no care plans that you recently modified to display for this patient.   Medication Assistance: Application for trelegy  medication assistance program. in process.  Anticipated assistance start date TBD.  See plan of care for additional detail.  Patient's preferred pharmacy is:  [id number] [address] [place] [address] Phone: [phone] Fax: [phone]  [address] [place] [address] Phone: [phone] Fax: [phone]  Uses pill box? No - has a system with tipping cap over and for AM/PM Pt endorses 100% compliance  Follow Up:  Patient agrees to Care Plan and Follow-up.  Plan: Telephone follow up appointment with care management team member  scheduled for:  1 month  Larinda Buttery, PharmD Clinical Pharmacist [facility] [phone]

## 2021-07-27 NOTE — Patient Instructions (Signed)
Visit Information   PATIENT GOALS:   Goals Addressed             This Visit's Progress    Medication Management       Patient Goals/Self-Care Activities Over the next 30 days, patient will:  collaborate with provider on medication access solutions  Follow Up Plan: Telephone follow up appointment with care management team member scheduled for:  1 month         Consent to CCM Services: Edward Cuevas was given information about Chronic Care Management services including:  CCM service includes personalized support from designated clinical staff supervised by his physician, including individualized plan of care and coordination with other care providers 24/7 contact phone numbers for assistance for urgent and routine care needs. Service will only be billed when office clinical staff spend 20 minutes or more in a month to coordinate care. Only one practitioner may furnish and bill the service in a calendar month. The patient may stop CCM services at any time (effective at the end of the month) by phone call to the office staff. The patient will be responsible for cost sharing (co-pay) of up to 20% of the service fee (after annual deductible is met).  Patient agreed to services and verbal consent obtained.   Patient verbalizes understanding of instructions provided today and agrees to view in [place].   Telephone follow up appointment with care management team member scheduled for: 1 month  [place]: Patient Care Plan: Medication Management     Problem Identified: HTN, HLD, COPD      Long-Range Goal: Disease Progression Prevention   Start Date: 07/24/2021  This Visit's Progress: On track  Priority: High  Note:   Current Barriers:  None at present   Pharmacist Clinical Goal(s):  Over the next 30 days, patient will verbalize ability to afford treatment regimen through collaboration with PharmD and provider.   Interventions: 1:1 collaboration  with Donella Stade, PA-C regarding development and update of comprehensive plan of care as evidenced by provider attestation and co-signature Inter-disciplinary care team collaboration (see longitudinal plan of care) Comprehensive medication review performed; medication list updated in electronic medical record  Hypertension:  Controlled; current treatment:lifestyle modifications only;   Current home readings: not currently checking  Denies hypotensive/hypertensive symptoms  Recommended continue current regimen,  Hyperlipidemia:  Controlled; current treatment:rosuvastatin 40mg  daily;   Recommended continue current regimen  Chronic Obstructive Pulmonary Disease:  Controlled; current treatment:trelegy daily (though patient reported taking it PRN instead of routinely on a daily basis to make it last longer due to cost;   0 exacerbations requiring treatment in the last 6 months   Recommended continue current regimen Assessed patient finances. Patient may be eligible for patient assistance, he requested we mail the forms + return envelope to him to fill out.   Patient Goals/Self-Care Activities Over the next 30 days, patient will:  collaborate with provider on medication access solutions  Follow Up Plan: Telephone follow up appointment with care management team member scheduled for:  1 month

## 2021-08-06 ENCOUNTER — Telehealth: Payer: Self-pay | Admitting: Pharmacist

## 2021-08-06 NOTE — Chronic Care Management (AMB) (Signed)
    Chronic Care Management Pharmacy Assistant   Name: Tayshawn Purnell  MRN: [id number] DOB: October 26, 1955  Reason for Encounter: Patient Assistance application for Trelegy    Medications: Outpatient Encounter Medications as of 08/06/2021  Medication Sig   acetaminophen (TYLENOL) 650 MG CR tablet Take 650 mg by mouth 2 (two) times daily.   ALPRAZolam (XANAX) 0.5 MG tablet Take 1 tablet (0.5 mg total) by mouth 2 (two) times daily.   aspirin EC 81 MG tablet Take 1 tablet (81 mg total) by mouth daily.   DULoxetine (CYMBALTA) 60 MG capsule Take 1 capsule (60 mg total) by mouth daily.   hydrocortisone (ANUSOL-HC) 2.5 % rectal cream Place 1 application rectally 2 (two) times daily. (Patient not taking: No sig reported)   MYRBETRIQ 50 MG TB24 tablet TAKE 1 TABLET(50 MG) BY MOUTH DAILY   nitroGLYCERIN (NITROSTAT) 0.4 MG SL tablet Place 1 tablet (0.4 mg total) under the tongue every 5 (five) minutes as needed for chest pain.   risperiDONE (RISPERDAL) 1 MG tablet TAKE ONE TABLET AT BEDTIME   rosuvastatin (CRESTOR) 40 MG tablet Take 40 mg by mouth daily.   tamsulosin (FLOMAX) 0.4 MG CAPS capsule TAKE 1 CAPSULE(0.4 MG) BY MOUTH AT BEDTIME   TRELEGY ELLIPTA 100-62.5-25 MCG/INH AEPB INHALE 1 PUFF INTO THE LUNGS DAILY   No facility-administered encounter medications on file as of 08/06/2021.    Prefilled and mailed patient assistance application for Trelegy with a returned envelope on 08/06/21. I have reached out to the patient about the patient assistance to guide and help with the form and he states he no longer needs the patient assistance application any more due to his Trelegy being under $10 now at his pharmacy. He states he can afford that each month so he will no longer need the application.  Corrie Mckusick, [place]

## 2021-08-10 DIAGNOSIS — C44519 Basal cell carcinoma of skin of other part of trunk: Secondary | ICD-10-CM | POA: Diagnosis not present

## 2021-08-10 DIAGNOSIS — C44612 Basal cell carcinoma of skin of right upper limb, including shoulder: Secondary | ICD-10-CM | POA: Diagnosis not present

## 2021-08-10 DIAGNOSIS — Z85828 Personal history of other malignant neoplasm of skin: Secondary | ICD-10-CM | POA: Diagnosis not present

## 2021-08-10 DIAGNOSIS — C44619 Basal cell carcinoma of skin of left upper limb, including shoulder: Secondary | ICD-10-CM | POA: Diagnosis not present

## 2021-08-10 DIAGNOSIS — D485 Neoplasm of uncertain behavior of skin: Secondary | ICD-10-CM | POA: Diagnosis not present

## 2021-08-10 DIAGNOSIS — C44722 Squamous cell carcinoma of skin of right lower limb, including hip: Secondary | ICD-10-CM | POA: Diagnosis not present

## 2021-08-17 DIAGNOSIS — I10 Essential (primary) hypertension: Secondary | ICD-10-CM

## 2021-08-17 DIAGNOSIS — E782 Mixed hyperlipidemia: Secondary | ICD-10-CM | POA: Diagnosis not present

## 2021-08-17 DIAGNOSIS — J42 Unspecified chronic bronchitis: Secondary | ICD-10-CM | POA: Diagnosis not present

## 2021-08-20 ENCOUNTER — Other Ambulatory Visit: Payer: Self-pay

## 2021-08-20 ENCOUNTER — Ambulatory Visit (INDEPENDENT_AMBULATORY_CARE_PROVIDER_SITE_OTHER): Payer: PPO | Admitting: Pharmacist

## 2021-08-20 DIAGNOSIS — J42 Unspecified chronic bronchitis: Secondary | ICD-10-CM

## 2021-08-20 DIAGNOSIS — I10 Essential (primary) hypertension: Secondary | ICD-10-CM

## 2021-08-20 DIAGNOSIS — E782 Mixed hyperlipidemia: Secondary | ICD-10-CM

## 2021-08-20 NOTE — Progress Notes (Signed)
Chronic Care Management Pharmacy Note  08/20/2021 Name:  Bayden Gil MRN:  [id number] DOB:  [birth year]  Summary: addressed HTN, HLD, COPD  Recommendations/Changes made from today's visit: none, originally worked on [employer] patient assistance for affordability, but patient states his inhaler went down to $10 per month and does not wish to pursue paperwork at this time, which is reasonable.  Plan:  f/u with pharmacist in  6-12 months  Subjective: Kerem Gilmer is an 65 y.o. year old male who is a primary patient of Alden Hipp, Royetta Car, PA-C.  The CCM team was consulted for assistance with disease management and care coordination needs.    Engaged with patient by telephone for follow up visit in response to provider referral for pharmacy case management and/or care coordination services.   Consent to Services:  The patient was given information about Chronic Care Management services, agreed to services, and gave verbal consent prior to initiation of services.  Please see initial visit note for detailed documentation.   Patient Care Team: Lavada Mesi as PCP - General (Family Medicine) Darius Bump, [facility] as Pharmacist (Pharmacist)  Recent office visits:  03/24/21 Iran Planas PA-C PCP- pt was seen for his annual medicare wellness exam. No labs or medication changes were made. Pt advised to f/u as planned.   Recent consult visits:  06/29/21 Golden Circle FNP Infectious Disease- pt was seen for Chronic hepatitis C without hepatic coma. Labs were ordered and pt advised to f/u in 3 months.   03/09/21 Golden Circle FNP Infectious Disease- pt was seen for Chronic hepatitis C without hepatic coma. Labs were ordered and pt advised to f/u Pending blood work and 1 month after start of medication.    Hospital visits:  None in previous 6 months  Objective:  Lab Results  Component Value Date   CREATININE 0.90 04/27/2021   CREATININE 0.95 02/25/2021   CREATININE 0.86 07/29/2020     Lab Results  Component Value Date   HGBA1C 5.6 02/25/2021   Last diabetic Eye exam: No results found for: HMDIABEYEEXA  Last diabetic Foot exam: No results found for: HMDIABFOOTEX      Component Value Date/Time   CHOL 130 07/29/2020 1305   CHOL 120 07/16/2019 1151   TRIG 84 07/29/2020 1305   HDL 47 07/29/2020 1305   HDL 43 07/16/2019 1151   CHOLHDL 2.8 07/29/2020 1305   VLDL 13 08/24/2016 1347   LDLCALC 67 07/29/2020 1305    Hepatic Function Latest Ref Rng & Units 04/27/2021 03/09/2021 03/09/2021  Total Protein 6.1 - 8.1 g/dL 7.1 8.0 -  Albumin 3.8 - 4.8 g/dL - - -  AST 10 - 35 U/L 21 54(H) -  ALT 9 - 46 U/L 13 78(H) 77(H)  Alk Phosphatase 39 - 117 IU/L - - -  Total Bilirubin 0.2 - 1.2 mg/dL 0.4 0.5 -  Bilirubin, Direct 0.0 - 0.2 mg/dL - 0.2 -    Lab Results  Component Value Date/Time   TSH 2.88 04/10/2019 08:11 AM   TSH 1.537 09/19/2014 05:16 PM    CBC Latest Ref Rng & Units 03/09/2021 04/10/2019 12/26/2015  WBC 3.8 - 10.8 Thousand/uL 4.3 5.8 6.0  Hemoglobin 13.2 - 17.1 g/dL 18.7(H) 17.1 15.8  Hematocrit 38.5 - 50.0 % 54.1(H) 48.7 45.3  Platelets 140 - 400 Thousand/uL 147 198 163    Lab Results  Component Value Date/Time   VD25OH 40 09/19/2014 05:16 PM   VD25OH 24 (L) 04/08/2014 02:36 PM  Clinical ASCVD: Yes  The 10-year ASCVD risk score (Arnett DK, et al., 2019) is: 17.8%   Values used to calculate the score:     Age: 45 years     Sex: Male     Is Non-Hispanic African American: No     Diabetic: No     Tobacco smoker: Yes     Systolic Blood Pressure: 323 mmHg     Is BP treated: No     HDL Cholesterol: 47 mg/dL     Total Cholesterol: 130 mg/dL    Other: (CHADS2VASc if Afib, PHQ9 if depression, MMRC or CAT for COPD, ACT, DEXA)  Social History   Tobacco Use  Smoking Status Every Day   Packs/day: 1.00   Years: 30.00   Pack years: 30.00   Types: Cigarettes  Smokeless Tobacco Never   BP Readings from Last 3 Encounters:  07/07/21 (!) 146/82   06/29/21 114/72  03/09/21 114/71   Pulse Readings from Last 3 Encounters:  07/07/21 69  06/29/21 81  03/09/21 76   Wt Readings from Last 3 Encounters:  07/07/21 188 lb (85.3 kg)  06/29/21 188 lb (85.3 kg)  06/23/21 188 lb (85.3 kg)    Assessment: Review of patient past medical history, allergies, medications, health status, including review of consultants reports, laboratory and other test data, was performed as part of comprehensive evaluation and provision of chronic care management services.   SDOH:  (Social Determinants of Health) assessments and interventions performed:    CCM Care Plan  Allergies  Allergen Reactions   Abilify [Aripiprazole]     Feel weird.    Penicillins Itching and Rash    Has patient had a PCN reaction causing immediate rash, facial/tongue/throat swelling, SOB or lightheadedness with hypotension: Yes Has patient had a PCN reaction causing severe rash involving mucus membranes or skin necrosis: No Has patient had a PCN reaction that required hospitalization No Has patient had a PCN reaction occurring within the last 10 years: No If all of the above answers are "NO", then may proceed with Cephalosporin use.     Medications Reviewed Today     Reviewed by Darius Bump, [facility] (Pharmacist) on 08/20/21 at [place] List Status: <None>   Medication Order Taking? Sig Documenting Provider Last Dose Status Informant  acetaminophen (TYLENOL) 650 MG CR tablet [id number] No Take 650 mg by mouth 2 (two) times daily. [provider] Taking Active   ALPRAZolam Duanne Moron) 0.5 MG tablet [id number] No Take 1 tablet (0.5 mg total) by mouth 2 (two) times daily. Mozingo, Berdie Ogren, NP Taking Active   aspirin EC 81 MG tablet [id number] No Take 1 tablet (81 mg total) by mouth daily. Richardo Priest, MD Taking Active   DULoxetine (CYMBALTA) 60 MG capsule [id number] No Take 1 capsule (60 mg total) by mouth daily. Mozingo, Berdie Ogren, NP Taking Active    hydrocortisone (ANUSOL-HC) 2.5 % rectal cream [id number] No Place 1 application rectally 2 (two) times daily.  Patient not taking: No sig reported   Lavada Mesi Not Taking Active   MYRBETRIQ 50 MG TB24 tablet [id number] No TAKE 1 TABLET(50 MG) BY MOUTH DAILY Breeback, Jade L, PA-C Taking Active   nitroGLYCERIN (NITROSTAT) 0.4 MG SL tablet [id number] No Place 1 tablet (0.4 mg total) under the tongue every 5 (five) minutes as needed for chest pain. Richardo Priest, MD Taking Expired 06/23/21 2359   risperiDONE (RISPERDAL) 1 MG tablet [id number] No TAKE ONE TABLET AT BEDTIME Mozingo,  Berdie Ogren, NP Taking Active   rosuvastatin (CRESTOR) 40 MG tablet [id number] No Take 40 mg by mouth daily. [provider] Taking Active   tamsulosin (FLOMAX) 0.4 MG CAPS capsule [id number] No TAKE 1 CAPSULE(0.4 MG) BY MOUTH AT BEDTIME Donella Stade, PA-C Taking Active   TRELEGY ELLIPTA 100-62.5-25 MCG/INH AEPB [id number] No INHALE 1 PUFF INTO THE LUNGS DAILY Donella Stade, PA-C Taking Active             Patient Active Problem List   Diagnosis Date Noted   Positive colorectal cancer screening using Cologuard test 03/10/2021   Chronic hepatitis C without hepatic coma ([place]) 03/09/2021   Hyperlipidemia    Hepatitis    Depression    COPD (chronic obstructive pulmonary disease) (HCC)    Cancer (HCC)    Arthritis    Anxiety    OAB (overactive bladder) 07/12/2019   Dyspnea on exertion 05/31/2019   Centrilobular emphysema ([place]) 05/16/2019   Moderate episode of recurrent major depressive disorder ([place]) 05/16/2019   Aortic atherosclerosis ([place]) 05/16/2019   Coronary artery disease due to calcified coronary lesion 05/16/2019   Pulmonary nodules 05/16/2019   Adrenal adenoma 04/17/2019   Carotid stenosis, right 04/17/2019   Abdominal distension (gaseous) 04/11/2019   Right carotid bruit 04/09/2019   Right lower quadrant abdominal pain 04/09/2019   Current smoker 04/09/2019   Lumbar  radiculopathy 01/18/2017   Chronic obstructive pulmonary disease ([place]) 12/29/2016   Fatty liver 10/01/2016   BPH without obstruction/lower urinary tract symptoms 11/25/2015   Chronic pain of multiple joints 11/25/2015   Actinic keratosis 06/27/2015   GAD (generalized anxiety disorder) 11/01/2014   History of hepatitis C 10/08/2014   Right inguinal hernia 10/08/2014   Panic attacks 10/04/2014   DJD 10/23/2013   DDD (degenerative disc disease), lumbosacral 10/23/2013   Mixed hyperlipidemia    Hypertension    Prediabetes    Vitamin D deficiency     Immunization History  Administered Date(s) Administered   Fluad Quad(high Dose 65+) 07/14/2020   Influenza,inj,Quad PF,6+ Mos 07/11/2019   Moderna Sars-Covid-2 Vaccination 12/25/2019, 01/22/2020, 07/07/2020   Pneumococcal Polysaccharide-23 04/07/2009   Tdap 04/12/2011    Conditions to be addressed/monitored: HTN, HLD, and COPD  Care Plan : Medication Management  Updates made by Darius Bump, RPH since 08/20/2021 12:00 AM     Problem: HTN, HLD, COPD      Long-Range Goal: Disease Progression Prevention   Start Date: 07/24/2021  Recent Progress: On track  Priority: High  Note:   Current Barriers:  None at present   Pharmacist Clinical Goal(s):  Over the next 180 days, patient will verbalize ability to afford treatment regimen through collaboration with PharmD and provider.   Interventions: 1:1 collaboration with Donella Stade, PA-C regarding development and update of comprehensive plan of care as evidenced by provider attestation and co-signature Inter-disciplinary care team collaboration (see longitudinal plan of care) Comprehensive medication review performed; medication list updated in electronic medical record  Hypertension:  Controlled; current treatment:lifestyle modifications only;   Current home readings: not currently checking  Denies hypotensive/hypertensive symptoms  Recommended continue current regimen,   Hyperlipidemia:  Controlled; current treatment:rosuvastatin 59m daily;   Recommended continue current regimen  Chronic Obstructive Pulmonary Disease:  Controlled; current treatment:trelegy daily (though patient reported taking it PRN instead of routinely on a daily basis to make it last longer due to cost;   0 exacerbations requiring treatment in the last 6 months   Recommended continue current regimen Assessed patient  finances. Patient may be eligible for patient assistance, he requested we mail the forms + return envelope to him to fill out.   Patient Goals/Self-Care Activities Over the next 180 days, patient will:  collaborate with provider on medication access solutions  Follow Up Plan: Telephone follow up appointment with care management team member scheduled for:  6-12 months      Medication Assistance: Application for trelegy  medication assistance program. in process.  Anticipated assistance start date TBD.  See plan of care for additional detail.  Patient's preferred pharmacy is:  [id number] [address], [address] AT [address] [place] [address] Phone: [phone] Fax: [phone]  [address] [place] [address] Phone: [phone] Fax: [phone]  Uses pill box? No - has a system with tipping cap over and for AM/PM Pt endorses 100% compliance  Follow Up:  Patient agrees to Care Plan and Follow-up.  Plan: Telephone follow up appointment with care management team member scheduled for:  1 month  Larinda Buttery, PharmD Clinical Pharmacist [facility] [phone]

## 2021-08-20 NOTE — Patient Instructions (Signed)
Visit Information  Patient verbalizes understanding of instructions provided today and agrees to view in [place].   Telephone follow up appointment with care management team member scheduled for:6-12 months Edward Cuevas

## 2021-09-01 ENCOUNTER — Ambulatory Visit: Payer: PPO | Admitting: Gastroenterology

## 2021-09-09 ENCOUNTER — Other Ambulatory Visit: Payer: Self-pay | Admitting: Physician Assistant

## 2021-09-09 DIAGNOSIS — K649 Unspecified hemorrhoids: Secondary | ICD-10-CM

## 2021-09-14 ENCOUNTER — Other Ambulatory Visit: Payer: Self-pay | Admitting: Adult Health

## 2021-09-14 DIAGNOSIS — F410 Panic disorder [episodic paroxysmal anxiety] without agoraphobia: Secondary | ICD-10-CM

## 2021-09-14 NOTE — Telephone Encounter (Signed)
Last filled 10/29 appt on 2/27

## 2021-09-15 ENCOUNTER — Other Ambulatory Visit: Payer: Self-pay

## 2021-09-16 DIAGNOSIS — J42 Unspecified chronic bronchitis: Secondary | ICD-10-CM | POA: Diagnosis not present

## 2021-09-16 DIAGNOSIS — I10 Essential (primary) hypertension: Secondary | ICD-10-CM | POA: Diagnosis not present

## 2021-09-16 DIAGNOSIS — E782 Mixed hyperlipidemia: Secondary | ICD-10-CM

## 2021-09-16 DIAGNOSIS — F17210 Nicotine dependence, cigarettes, uncomplicated: Secondary | ICD-10-CM | POA: Diagnosis not present

## 2021-09-23 ENCOUNTER — Other Ambulatory Visit: Payer: Self-pay | Admitting: Physician Assistant

## 2021-09-28 ENCOUNTER — Other Ambulatory Visit: Payer: Medicare Other

## 2021-09-28 ENCOUNTER — Other Ambulatory Visit: Payer: Self-pay

## 2021-09-28 DIAGNOSIS — B182 Chronic viral hepatitis C: Secondary | ICD-10-CM

## 2021-09-30 LAB — HEPATITIS C RNA QUANTITATIVE
HCV Quantitative Log: <1.18 log IU/mL
HCV RNA, PCR, QN: <15 IU/mL

## 2021-10-01 ENCOUNTER — Telehealth: Payer: Self-pay

## 2021-10-01 NOTE — Telephone Encounter (Signed)
Message sent to patient via my chart.   [place], CMA

## 2021-10-01 NOTE — Telephone Encounter (Signed)
-----   Message from Golden Circle, [place] sent at 09/30/2021  4:14 PM EST ----- Please inform Mr. Brunetto that his Hepatitis C is cured and no further treatment is necessary.

## 2021-10-13 ENCOUNTER — Ambulatory Visit (INDEPENDENT_AMBULATORY_CARE_PROVIDER_SITE_OTHER): Payer: Medicare Other | Admitting: Family

## 2021-10-13 ENCOUNTER — Encounter: Payer: Self-pay | Admitting: Family

## 2021-10-13 ENCOUNTER — Other Ambulatory Visit: Payer: Self-pay

## 2021-10-13 VITALS — BP 119/72 | HR 80 | Temp 98.2°F | Wt 190.0 lb

## 2021-10-13 DIAGNOSIS — B182 Chronic viral hepatitis C: Secondary | ICD-10-CM

## 2021-10-13 NOTE — Progress Notes (Signed)
Subjective:    Patient ID: Edward Cuevas, male    DOB: 1956-05-15, 65 y.o.   MRN: [id number]  Chief Complaint  Patient presents with   Follow-up    Hep C    HPI:  Edward Cuevas is a 65 y.o. male with Chronic Hepatitis C last seen on 06/29/21 following 12 weeks of Epclusa with Hepatitis C RNA level that was undetectable. Here today for follow up and SVR12 visit.   Edward Cuevas has been doing well since his last office visit with no new concerns/complaints. Denies any abdominal pain, nausea, vomiting, jaundice, scleral icterus or fatigue. Has a birthday upcoming.    Allergies  Allergen Reactions   Abilify [Aripiprazole]     Feel weird.    Penicillins Itching and Rash    Has patient had a PCN reaction causing immediate rash, facial/tongue/throat swelling, SOB or lightheadedness with hypotension: Yes Has patient had a PCN reaction causing severe rash involving mucus membranes or skin necrosis: No Has patient had a PCN reaction that required hospitalization No Has patient had a PCN reaction occurring within the last 10 years: No If all of the above answers are "NO", then may proceed with Cephalosporin use.       Outpatient Medications Prior to Visit  Medication Sig Dispense Refill   acetaminophen (TYLENOL) 650 MG CR tablet Take 650 mg by mouth 2 (two) times daily.     ALPRAZolam (XANAX) 0.5 MG tablet TAKE 1 TABLET(0.5 MG) BY MOUTH TWICE DAILY 60 tablet 2   aspirin EC 81 MG tablet Take 1 tablet (81 mg total) by mouth daily. 90 tablet 3   DULoxetine (CYMBALTA) 60 MG capsule Take 1 capsule (60 mg total) by mouth daily. 30 capsule 5   hydrocortisone (ANUSOL-HC) 2.5 % rectal cream APPLY RECTALLY TO THE AFFECTED AREA TWICE DAILY 60 g 1   imiquimod (ALDARA) 5 % cream SMARTSIG:sparingly Topical Every Night     MYRBETRIQ 50 MG TB24 tablet TAKE 1 TABLET(50 MG) BY MOUTH DAILY 90 tablet 1   risperiDONE (RISPERDAL) 1 MG tablet TAKE ONE TABLET AT BEDTIME 30 tablet 5   rosuvastatin (CRESTOR) 40 MG  tablet Take 40 mg by mouth daily.     tamsulosin (FLOMAX) 0.4 MG CAPS capsule TAKE 1 CAPSULE(0.4 MG) BY MOUTH AT BEDTIME 90 capsule 1   TRELEGY ELLIPTA 100-62.5-25 MCG/ACT AEPB INHALE 1 PUFF INTO THE LUNGS DAILY 60 each 5   nitroGLYCERIN (NITROSTAT) 0.4 MG SL tablet Place 1 tablet (0.4 mg total) under the tongue every 5 (five) minutes as needed for chest pain. 25 tablet 3   No facility-administered medications prior to visit.     Past Medical History:  Diagnosis Date   Anxiety    Arthritis    Cancer ([place])    skin   COPD (chronic obstructive pulmonary disease) ([place])    per pt ?   Depression    Hepatitis    A,B,C   Hyperlipidemia    Hypertension    Prediabetes    Vitamin D deficiency      Past Surgical History:  Procedure Laterality Date   COLONOSCOPY  2011   HPP   ELBOW SURGERY Right 10/18/1984   HEMORRHOID SURGERY  1977 and [place] Left 10/18/1994   INGUINAL HERNIA REPAIR Right 12/30/2015   Procedure: RIGHT RIGHT INGUINAL HERNIA REPAIR;  Surgeon: Ralene Ok, MD;  Location: [facility];  Service: General;  Laterality: Right;   INSERTION OF MESH Right 12/30/2015   Procedure: INSERTION OF  MESH;  Surgeon: Ralene Ok, MD;  Location: [facility];  Service: General;  Laterality: Right;   POLYPECTOMY  2011   HPP   SHOULDER SURGERY Left 10/18/2005       Review of Systems  Constitutional:  Negative for chills, diaphoresis, fatigue and fever.  Respiratory:  Negative for cough, chest tightness, shortness of breath and wheezing.   Cardiovascular:  Negative for chest pain.  Gastrointestinal:  Negative for abdominal pain, diarrhea, nausea and vomiting.     Objective:    BP 119/72    Pulse 80    Temp 98.2 F (36.8 C) (Temporal)    Wt 190 lb (86.2 kg)    BMI 27.26 kg/m  Nursing note and vital signs reviewed.  Physical Exam Constitutional:      General: He is not in acute distress.    Appearance: He is well-developed.  Cardiovascular:     Rate and Rhythm:  Normal rate and regular rhythm.     Heart sounds: Normal heart sounds. No murmur heard.   No friction rub. No gallop.  Pulmonary:     Effort: Pulmonary effort is normal. No respiratory distress.     Breath sounds: Normal breath sounds. No wheezing or rales.  Chest:     Chest wall: No tenderness.  Abdominal:     General: Bowel sounds are normal. There is no distension.     Palpations: Abdomen is soft. There is no mass.     Tenderness: There is no abdominal tenderness. There is no guarding or rebound.  Skin:    General: Skin is warm and dry.  Neurological:     Mental Status: He is alert and oriented to person, place, and time.  Psychiatric:        Behavior: Behavior normal.        Thought Content: Thought content normal.        Judgment: Judgment normal.     Depression screen [facility] 2/9 10/13/2021 06/29/2021 03/24/2021 07/14/2020 01/09/2020  Decreased Interest 0 0 0 0 2  Down, Depressed, Hopeless 0 0 0 0 2  PHQ - 2 Score 0 0 0 0 4  Altered sleeping - - 0 1 0  Tired, decreased energy - - 0 2 2  Change in appetite - - 0 0 0  Feeling bad or failure about yourself  - - 0 1 2  Trouble concentrating - - 0 0 2  Moving slowly or fidgety/restless - - 0 0 0  Suicidal thoughts - - 0 0 0  PHQ-9 Score - - 0 4 10  Difficult doing work/chores - - Not difficult at all Not difficult at all -       Assessment & Plan:    Patient Active Problem List   Diagnosis Date Noted   Positive colorectal cancer screening using Cologuard test 03/10/2021   Chronic hepatitis C without hepatic coma ([place]) 03/09/2021   Hyperlipidemia    Hepatitis    Depression    COPD (chronic obstructive pulmonary disease) (HCC)    Cancer (HCC)    Arthritis    Anxiety    OAB (overactive bladder) 07/12/2019   Dyspnea on exertion 05/31/2019   Centrilobular emphysema ([place]) 05/16/2019   Moderate episode of recurrent major depressive disorder ([place]) 05/16/2019   Aortic atherosclerosis ([place]) 05/16/2019   Coronary artery disease  due to calcified coronary lesion 05/16/2019   Pulmonary nodules 05/16/2019   Adrenal adenoma 04/17/2019   Carotid stenosis, right 04/17/2019   Abdominal distension (gaseous) 04/11/2019  Right carotid bruit 04/09/2019   Right lower quadrant abdominal pain 04/09/2019   Current smoker 04/09/2019   Lumbar radiculopathy 01/18/2017   Chronic obstructive pulmonary disease ([place]) 12/29/2016   Fatty liver 10/01/2016   BPH without obstruction/lower urinary tract symptoms 11/25/2015   Chronic pain of multiple joints 11/25/2015   Actinic keratosis 06/27/2015   GAD (generalized anxiety disorder) 11/01/2014   History of hepatitis C 10/08/2014   Right inguinal hernia 10/08/2014   Panic attacks 10/04/2014   DJD 10/23/2013   DDD (degenerative disc disease), lumbosacral 10/23/2013   Mixed hyperlipidemia    Hypertension    Prediabetes    Vitamin D deficiency      Problem List Items Addressed This Visit       Digestive   Chronic hepatitis C without hepatic coma ([place]) - Primary    Edward Cuevas Hepatitis C RNA level is undetectable indicating successful treatment of Hepatitis C with sustain viremic response. Reviewed results and recommended routine liver cancer screening every 6 months due to elevated fibrosis score of F3-F4 with ultrasound with/or without alpha-fetoprotein as he is at increased risk for hepatocellular carcinoma. No further treatment is necessary. If concern for Hepatitis C in the future will need Hepatitis C RNA level check as Hepatitis C antibody will always remain positive. Follow up with ID as needed.       Relevant Medications   imiquimod (ALDARA) 5 % cream     I am having Edward Cuevas maintain his acetaminophen, aspirin EC, nitroGLYCERIN, DULoxetine, risperiDONE, rosuvastatin, Myrbetriq, tamsulosin, hydrocortisone, ALPRAZolam, Trelegy Ellipta, and imiquimod.    Follow-up:  As needed.    Terri Piedra, MSN, FNP-C Nurse Practitioner [facility] for Infectious  Disease [place] number: [phone]

## 2021-10-13 NOTE — Patient Instructions (Addendum)
Nice to see you.  Your hepatitis C is cured and no further treatment needed.   Recommend routine screening every 6 months with ultrasound with or without blood work for liver cancer screening.   If you need to be tested in the future it is recommended for Hepatitis C RNA level or viral load as Hepatitis C antibody testing will always be positive.  No follow up needed.   Have a great day and stay safe!

## 2021-10-13 NOTE — Assessment & Plan Note (Signed)
Edward Cuevas's Hepatitis C RNA level is undetectable indicating successful treatment of Hepatitis C with sustain viremic response. Reviewed results and recommended routine liver cancer screening every 6 months due to elevated fibrosis score of F3-F4 with ultrasound with/or without alpha-fetoprotein as he is at increased risk for hepatocellular carcinoma. No further treatment is necessary. If concern for Hepatitis C in the future will need Hepatitis C RNA level check as Hepatitis C antibody will always remain positive. Follow up with ID as needed.

## 2021-12-14 ENCOUNTER — Encounter: Payer: Self-pay | Admitting: Adult Health

## 2021-12-14 ENCOUNTER — Ambulatory Visit: Payer: Medicare Other | Admitting: Adult Health

## 2021-12-14 ENCOUNTER — Other Ambulatory Visit: Payer: Self-pay

## 2021-12-14 DIAGNOSIS — F410 Panic disorder [episodic paroxysmal anxiety] without agoraphobia: Secondary | ICD-10-CM

## 2021-12-14 DIAGNOSIS — F411 Generalized anxiety disorder: Secondary | ICD-10-CM | POA: Diagnosis not present

## 2021-12-14 DIAGNOSIS — F331 Major depressive disorder, recurrent, moderate: Secondary | ICD-10-CM

## 2021-12-14 DIAGNOSIS — G4700 Insomnia, unspecified: Secondary | ICD-10-CM

## 2021-12-14 MED ORDER — ALPRAZOLAM 0.5 MG PO TABS: ORAL_TABLET | ORAL | 2 refills | Status: DC

## 2021-12-14 MED ORDER — DULOXETINE HCL 60 MG PO CPEP: 60.0000 mg | ORAL_CAPSULE | Freq: Every day | ORAL | 5 refills | Status: DC

## 2021-12-14 MED ORDER — RISPERIDONE 1 MG PO TABS: ORAL_TABLET | ORAL | 5 refills | Status: DC

## 2021-12-14 NOTE — Progress Notes (Signed)
Edward Cuevas [id number] 02/20/1956 66 y.o.  Subjective:   Patient ID:  Edward Cuevas is a 66 y.o. (DOB 1956-05-31) male.  Chief Complaint: No chief complaint on file.   HPI Edward Cuevas presents to the office today for follow-up of MDD, anxiety, panic attacks, and insomnia.  Describes mood today as "ok". Pleasant. Mood symptoms - denies depression, anxiety, and irritability. Denies panic attacks. Denies worry and ruminations. Stating "I'm doing good". Feels like current medications work well for him. Stable interest and motivation. Taking medications as prescribed.  Energy levels stable. Active, does not have a regular exercise routine with physical disabilities.  Enjoys some usual interests and activities. Divorced. Lives with ex wife. Has a 15 year old son locally and another son in [place]. Has a sister in [place]. Spending time with family. Appetite adequate. Weight stable - 180 pounds. Sleeps well most nights. Averages 6 hours. Focus and concentration stable. Completing tasks. Managing aspects of household. Receiving disability benefits/retirement. Retired Engineer, mining.  Denies SI or HI.  Denies AH or VH.  Previous medication trials: Cymbalta, Wellbutrin   AUDIT    Flowsheet Row Office Visit from 03/24/2021 in [place]  Alcohol Use Disorder Identification Test Final Score (AUDIT) 9      GAD-7    Flowsheet Row Office Visit from 07/14/2020 in [place] Office Visit from 01/09/2020 in [place] Office Visit from 11/28/2019 in [facility] Visit from 07/11/2019 in [place] Visit from 05/16/2019 in [place]  Total GAD-7 Score 0 6 3 0 1      PHQ2-9    [place] Office Visit from 10/13/2021 in [facility]  Visit from 06/29/2021 in [facility] Office Visit from 03/24/2021 in [place] Office Visit from 07/14/2020 in [place] Office Visit from 01/09/2020 in [place]  PHQ-2 Total Score 0 0 0 0 4  PHQ-9 Total Score -- -- 0 4 10        Review of Systems:  Review of Systems  Musculoskeletal:  Negative for gait problem.  Neurological:  Negative for tremors.  Psychiatric/Behavioral:         Please refer to HPI   Medications: I have reviewed the patient's current medications.  Current Outpatient Medications  Medication Sig Dispense Refill   acetaminophen (TYLENOL) 650 MG CR tablet Take 650 mg by mouth 2 (two) times daily.     ALPRAZolam (XANAX) 0.5 MG tablet TAKE 1 TABLET(0.5 MG) BY MOUTH TWICE DAILY 60 tablet 2   aspirin EC 81 MG tablet Take 1 tablet (81 mg total) by mouth daily. 90 tablet 3   DULoxetine (CYMBALTA) 60 MG capsule Take 1 capsule (60 mg total) by mouth daily. 30 capsule 5   hydrocortisone (ANUSOL-HC) 2.5 % rectal cream APPLY RECTALLY TO THE AFFECTED AREA TWICE DAILY 60 g 1   imiquimod (ALDARA) 5 % cream SMARTSIG:sparingly Topical Every Night     MYRBETRIQ 50 MG TB24 tablet TAKE 1 TABLET(50 MG) BY MOUTH DAILY 90 tablet 1   nitroGLYCERIN (NITROSTAT) 0.4 MG SL tablet Place 1 tablet (0.4 mg total) under the tongue every 5 (five) minutes as needed for chest pain. 25 tablet 3  risperiDONE (RISPERDAL) 1 MG tablet TAKE ONE TABLET AT BEDTIME 30 tablet 5   rosuvastatin (CRESTOR) 40 MG tablet Take 40 mg by mouth daily.     tamsulosin (FLOMAX) 0.4 MG CAPS capsule TAKE 1 CAPSULE(0.4 MG) BY MOUTH AT BEDTIME 90 capsule 1   TRELEGY ELLIPTA 100-62.5-25 MCG/ACT AEPB INHALE 1 PUFF INTO THE LUNGS DAILY 60 each 5   No current facility-administered medications for this visit.    Medication Side Effects: None  Allergies:  Allergies  Allergen  Reactions   Abilify [Aripiprazole]     Feel weird.    Penicillins Itching and Rash    Has patient had a PCN reaction causing immediate rash, facial/tongue/throat swelling, SOB or lightheadedness with hypotension: Yes Has patient had a PCN reaction causing severe rash involving mucus membranes or skin necrosis: No Has patient had a PCN reaction that required hospitalization No Has patient had a PCN reaction occurring within the last 10 years: No If all of the above answers are "NO", then may proceed with Cephalosporin use.     Past Medical History:  Diagnosis Date   Anxiety    Arthritis    Cancer ([place])    skin   COPD (chronic obstructive pulmonary disease) ([place])    per pt ?   Depression    Hepatitis    A,B,C   Hyperlipidemia    Hypertension    Prediabetes    Vitamin D deficiency     Past Medical History, Surgical history, Social history, and Family history were reviewed and updated as appropriate.   Please see review of systems for further details on the patient's review from today.   Objective:   Physical Exam:  There were no vitals taken for this visit.  Physical Exam Constitutional:      General: He is not in acute distress. Musculoskeletal:        General: No deformity.  Neurological:     Mental Status: He is alert and oriented to person, place, and time.     Coordination: Coordination normal.  Psychiatric:        Attention and Perception: Attention and perception normal. He does not perceive auditory or visual hallucinations.        Mood and Affect: Mood normal. Mood is not anxious or depressed. Affect is not labile, blunt, angry or inappropriate.        Speech: Speech normal.        Behavior: Behavior normal.        Thought Content: Thought content normal. Thought content is not paranoid or delusional. Thought content does not include homicidal or suicidal ideation. Thought content does not include homicidal or suicidal plan.        Cognition and Memory:  Cognition and memory normal.        Judgment: Judgment normal.     Comments: Insight intact    Lab Review:     Component Value Date/Time   NA 137 04/27/2021 1355   NA 136 07/16/2019 1151   K 5.1 04/27/2021 1355   CL 103 04/27/2021 1355   CO2 32 04/27/2021 1355   GLUCOSE 103 (H) 04/27/2021 1355   BUN 11 04/27/2021 1355   BUN 10 07/16/2019 1151   CREATININE 0.90 04/27/2021 1355   CALCIUM 9.6 04/27/2021 1355   PROT 7.1 04/27/2021 1355   PROT 7.2 07/16/2019 1151   ALBUMIN 4.6 07/16/2019 1151   AST 21 04/27/2021 1355   ALT 13 04/27/2021 1355   ALT 77 (H) 03/09/2021 1421  ALKPHOS 60 07/16/2019 1151   BILITOT 0.4 04/27/2021 1355   BILITOT 0.2 07/16/2019 1151   GFRNONAA 83 02/25/2021 1340   GFRAA 96 02/25/2021 1340       Component Value Date/Time   WBC 4.3 03/09/2021 1421   RBC 5.79 03/09/2021 1421   HGB 18.7 (H) 03/09/2021 1421   HCT 54.1 (H) 03/09/2021 1421   PLT 147 03/09/2021 1421   MCV 93.4 03/09/2021 1421   MCH 32.3 03/09/2021 1421   MCHC 34.6 03/09/2021 1421   RDW 12.5 03/09/2021 1421   LYMPHSABS 2,627 04/10/2019 0811   MONOABS 0.4 09/19/2014 1716   EOSABS 81 04/10/2019 0811   BASOSABS 58 04/10/2019 0811    No results found for: POCLITH, LITHIUM   No results found for: PHENYTOIN, PHENOBARB, VALPROATE, CBMZ   .res Assessment: Plan:    Plan:  PDMP reviewed  1. Cymbalta 60mg  daily   2. Risperdal 1mg  at hs  3. Xanax 0.5mg  at bedtime to BID  Read and reviewed note with patient for accuracy.   RTC 6 months  Patient advised to contact office with any questions, adverse effects, or acute worsening in signs and symptoms.  Discussed potential metabolic side effects associated with atypical antipsychotics, as well as potential risk for movement side effects. Advised pt to contact office if movement side effects occur.   Diagnoses and all orders for this visit:  Insomnia, unspecified type  Moderate episode of recurrent major depressive disorder  ([place])  Generalized anxiety disorder  Panic attacks     Please see After Visit Summary for patient specific instructions.  Future Appointments  Date Time Provider Friendly  06/03/2022  1:30 PM [facility] PHARMACIST [facility] None    No orders of the defined types were placed in this encounter.   -------------------------------

## 2022-01-04 ENCOUNTER — Other Ambulatory Visit: Payer: Self-pay | Admitting: Physician Assistant

## 2022-01-04 DIAGNOSIS — N3281 Overactive bladder: Secondary | ICD-10-CM

## 2022-01-08 ENCOUNTER — Other Ambulatory Visit: Payer: Self-pay | Admitting: Physician Assistant

## 2022-03-16 ENCOUNTER — Telehealth: Payer: Self-pay | Admitting: Adult Health

## 2022-03-16 ENCOUNTER — Other Ambulatory Visit: Payer: Self-pay

## 2022-03-16 DIAGNOSIS — F410 Panic disorder [episodic paroxysmal anxiety] without agoraphobia: Secondary | ICD-10-CM

## 2022-03-16 MED ORDER — ALPRAZOLAM 0.5 MG PO TABS: ORAL_TABLET | ORAL | 2 refills | Status: DC

## 2022-03-16 NOTE — Telephone Encounter (Signed)
Pt would like refill of Alprazolam to [facility].  Next appt 8/28

## 2022-03-16 NOTE — Telephone Encounter (Signed)
Pended.

## 2022-03-18 ENCOUNTER — Telehealth: Payer: Self-pay | Admitting: Neurology

## 2022-03-18 NOTE — Telephone Encounter (Signed)
Patient left vm stating he is now having problems with sciatic nerve on left side. He has had issues in the past on the right side and was sent to orthopedics.   Patient has not seen Marirose Deveney in over a year. Will need appt. Please call patient to schedule.

## 2022-03-19 NOTE — Telephone Encounter (Signed)
Patient called back and left another vm on my line, can we reach back out to him?

## 2022-03-19 NOTE — Telephone Encounter (Signed)
Patient scheduled for 03/22/22.

## 2022-03-19 NOTE — Telephone Encounter (Signed)
V.Mail left for patient to return call to schedule an office visit!

## 2022-03-22 ENCOUNTER — Ambulatory Visit (INDEPENDENT_AMBULATORY_CARE_PROVIDER_SITE_OTHER): Payer: Medicare Other

## 2022-03-22 ENCOUNTER — Ambulatory Visit (INDEPENDENT_AMBULATORY_CARE_PROVIDER_SITE_OTHER): Payer: Medicare Other | Admitting: Physician Assistant

## 2022-03-22 ENCOUNTER — Encounter: Payer: Self-pay | Admitting: Physician Assistant

## 2022-03-22 VITALS — BP 132/76 | HR 88 | Ht 72.0 in | Wt 182.0 lb

## 2022-03-22 DIAGNOSIS — M5137 Other intervertebral disc degeneration, lumbosacral region: Secondary | ICD-10-CM | POA: Diagnosis not present

## 2022-03-22 DIAGNOSIS — M5442 Lumbago with sciatica, left side: Secondary | ICD-10-CM | POA: Diagnosis not present

## 2022-03-22 DIAGNOSIS — G8929 Other chronic pain: Secondary | ICD-10-CM

## 2022-03-22 HISTORY — DX: Other chronic pain: G89.29

## 2022-03-22 MED ORDER — PREDNISONE 50 MG PO TABS: ORAL_TABLET | ORAL | 0 refills | Status: DC

## 2022-03-22 MED ORDER — KETOROLAC TROMETHAMINE 60 MG/2ML IM SOLN
60.0000 mg | Freq: Once | INTRAMUSCULAR | Status: AC
  Administered 2022-03-22: 60 mg via INTRAMUSCULAR

## 2022-03-22 MED ORDER — CYCLOBENZAPRINE HCL 10 MG PO TABS: 10.0000 mg | ORAL_TABLET | Freq: Three times a day (TID) | ORAL | 0 refills | Status: DC | PRN

## 2022-03-22 NOTE — Patient Instructions (Addendum)
Use heat Get xray today Start prednisone and muscle relaxer  Consider MRI and NSAID if not improving  Spinal Stenosis  Spinal stenosis happens when the spinal canal gets smaller. The spinal canal is the space between the bones of your spine (vertebrae). As the canal gets smaller, the nerves that pass that part of the spine are pressed. This causes pain. Spinal stenosis can affect your neck, upper back, or lower back. What are the causes? This condition is caused by parts of bone that push into your spinal canal. This problem may start before birth. If it occurs after birth, the cause may be: Breakdown of bones of your spine. This normally starts around 67 years of age. Injury to your spine. Tumors in your spine. A buildup of calcium in your spine. What increases the risk? You are more likely to develop this condition if: You are older than age 58. You were born with a problem in your spine, such as a curved spine (scoliosis). You have arthritis. This is disease of your joints. What are the signs or symptoms? Common symptoms of this condition include: Pain in your neck or back. The pain may be worse when you stand or walk. Problems with your legs. A leg may lose feeling, tingle, or turn hot or cold. Pain that goes from your butt down to your lower leg (sciatica). Falling a lot. Slapping your foot down when you walk. This weakens muscles. Severe symptoms of this condition include: Problems pooping or peeing. Trouble having sex. Loss of feeling in your leg. Being unable to walk. Sometimes there are no symptoms. How is this treated? To treat pain and manage symptoms, you may be asked to: Practice sitting and standing up straight. This is good posture. Exercise. Lose weight, if needed. Take medicines or get shots. Support your back with a corset or a brace. In some cases, you may need to have surgery. Follow these instructions at home: Managing pain, stiffness, and  swelling  Stand and sit up straight. If you have a brace or a corset, wear it as told. Keep a healthy weight. Talk with your doctor if you need help losing weight. If told, put heat on the affected area. Do this as often as told by your doctor. Use the heat source that your doctor recommends, such as a moist heat pack or a heating pad. Place a towel between your skin and the heat source. Leave the heat on for 20-30 minutes. Take off the heat if your skin turns bright red. This is very important if you are unable to feel pain, heat, or cold. You have a greater risk of getting burned. Activity Do exercises as told by your doctor. Do not do activities that cause pain. Ask your doctor what activities are safe for you. Do not lift anything that is heavier than 10 lb (4.5 kg), or the limit that you are told by your doctor. Return to your normal activities as told by your doctor. Ask your doctor what activities are safe for you. General instructions Take over-the-counter and prescription medicines only as told by your doctor. Do not use any products that contain nicotine or tobacco, such as cigarettes, e-cigarettes, and chewing tobacco. If you need help quitting, ask your doctor. Eat a healthy diet. Eat a lot of: Fruits. Vegetables. Whole grains. Low-fat (lean) protein. Keep all follow-up visits as told by your doctor. This is important. Contact a doctor if: Your symptoms do not get better. Your symptoms get worse. You  have a fever. Get help right away if: You have new pain or very bad pain in your neck or upper back. You have very bad pain, and medicine does not help. You have a very bad headache. You are dizzy. You do not see well. You vomit or feel like you may vomit. You have these things in your back or legs: New or worse loss of feeling. New or worse tingling. Your arm or leg: Hurts or swells. Turns red. Feels warm. Summary Spinal stenosis happens when the space between the  bones of the spine gets smaller. The small space puts pressure on the nerves in your spine. This condition may start before birth. Breakdown of bones, injuries, or tumors can cause this condition after birth. Spinal stenosis can cause pain in the neck, back, legs, or butt. A leg may lose feeling, tingle, or turn hot or cold. Treatment for this condition focuses on lessening your pain and other symptoms. This information is not intended to replace advice given to you by your health care provider. Make sure you discuss any questions you have with your health care provider. Document Revised: 11/04/2021 Document Reviewed: 08/02/2019 Elsevier Patient Education  [place].

## 2022-03-22 NOTE — Progress Notes (Signed)
Established Patient Office Visit  Subjective   Patient ID: Edward Cuevas, male    DOB: 11-20-1955  Age: 66 y.o. MRN: [id number]  Chief Complaint  Patient presents with   Follow-up    HPI Pt is a 66 yo male with hx of lumbar DDD and chronic low back pain who presents to the clinic with worsening left sided low back pain. Pain is better with leaning forward. No new injuries. Rates pain at times 8/10. Radiates into left buttocks from time to time. 2018 had epidural injection which helped a lot. No bowel or bladder dysfunction, no leg weakness, no saddle anesthesia.   Last MRI in 2018.   IMPRESSION: Transitional lumbosacral anatomy. Please see numbering scheme above which is different than that used on the prior plain films.   Small left foraminal protrusion at L2-3 contacts the exiting left L2 root without compression or displacement.   Facet degenerative disease on the right at L4-5 causes moderate right foraminal narrowing.     ROS See HPI.    Objective:     BP 132/76   Pulse 88   Ht 6' (1.829 m)   Wt 182 lb (82.6 kg)   SpO2 99%   BMI 24.68 kg/m  BP Readings from Last 3 Encounters:  03/22/22 132/76  10/13/21 119/72  07/07/21 (!) 146/82      Physical Exam Vitals reviewed.  Constitutional:      Appearance: Normal appearance.  Cardiovascular:     Rate and Rhythm: Normal rate and regular rhythm.  Pulmonary:     Effort: Pulmonary effort is normal.  Musculoskeletal:     Right lower leg: No edema.     Left lower leg: No edema.     Comments: Limited ROM at waist due to pain Pt is leaning forward in office which helps with pain STR on left pain is in left low back into buttock but does not radiate down leg 5/5 strength of lower ext, bilaterally.   Neurological:     General: No focal deficit present.     Mental Status: He is alert and oriented to person, place, and time.  Psychiatric:        Mood and Affect: Mood normal.         Assessment & Plan:   .Marland KitchenJohn was seen today for follow-up.  Diagnoses and all orders for this visit:  Chronic left-sided low back pain with left-sided sciatica -     DG Lumbar Spine Complete; Future -     ketorolac (TORADOL) injection 60 mg -     Ambulatory referral to Physical Therapy  DDD (degenerative disc disease), lumbosacral -     DG Lumbar Spine Complete; Future -     ketorolac (TORADOL) injection 60 mg -     Ambulatory referral to Physical Therapy  Other orders -     Discontinue: predniSONE (DELTASONE) 50 MG tablet; One tab PO daily for 5 days. -     Discontinue: cyclobenzaprine (FLEXERIL) 10 MG tablet; Take 1 tablet (10 mg total) by mouth 3 (three) times daily as needed for muscle spasms.   Appears to be flare of chronic problem with good relief from epidural injections in 2018 No new injuries Toradol IM injection today Repeat lumbar xrays 5 days of prednisone given Flexeril as needed discussed sedation warning Consider formal PT Home exercises given May need to repeat MRI since older image Consider starting NSAId pt is higher risk of NSAID complications due to smoking Follow up with Dr.  T in 2 weeks  Return in about 2 weeks (around 04/05/2022), or if symptoms worsen or fail to improve, for sports medicine.    Edward Planas, PA-C

## 2022-03-23 ENCOUNTER — Other Ambulatory Visit: Payer: Self-pay

## 2022-03-23 MED ORDER — PREDNISONE 50 MG PO TABS: ORAL_TABLET | ORAL | 0 refills | Status: DC

## 2022-03-23 MED ORDER — CYCLOBENZAPRINE HCL 10 MG PO TABS: 10.0000 mg | ORAL_TABLET | Freq: Three times a day (TID) | ORAL | 0 refills | Status: AC | PRN

## 2022-03-23 NOTE — Progress Notes (Signed)
Lots of degenerative changes as suspected. No acute findings.

## 2022-04-01 ENCOUNTER — Telehealth: Payer: Self-pay

## 2022-04-01 MED ORDER — ROSUVASTATIN CALCIUM 40 MG PO TABS: 40.0000 mg | ORAL_TABLET | Freq: Every day | ORAL | 0 refills | Status: DC

## 2022-04-01 NOTE — Telephone Encounter (Signed)
Rosuvastatin 40 mg # 90 only with message to patient and pharmacy needs appointment for future refills / 1st attempt

## 2022-04-02 ENCOUNTER — Telehealth: Payer: Self-pay | Admitting: Neurology

## 2022-04-02 DIAGNOSIS — M5137 Other intervertebral disc degeneration, lumbosacral region: Secondary | ICD-10-CM

## 2022-04-02 DIAGNOSIS — G8929 Other chronic pain: Secondary | ICD-10-CM

## 2022-04-02 NOTE — Telephone Encounter (Signed)
Patient called and left vm stating his back pain was better for a few days after seeing [place] and getting Toradol shot/prednisone. He called today stating back pain worse again and wants referral to orthopedic doctor he saw in [place] a long time ago. Last note states to follow up with Dr. Darene Lamer in two weeks. Did you want him to follow up here or see ortho? Referral pended if appropriate.

## 2022-04-02 NOTE — Telephone Encounter (Signed)
I signed referral. He can see either.

## 2022-04-02 NOTE — Telephone Encounter (Signed)
Patient made aware.

## 2022-04-08 ENCOUNTER — Other Ambulatory Visit: Payer: Self-pay | Admitting: Physician Assistant

## 2022-04-13 ENCOUNTER — Other Ambulatory Visit: Payer: Self-pay | Admitting: Physician Assistant

## 2022-04-13 DIAGNOSIS — N3281 Overactive bladder: Secondary | ICD-10-CM

## 2022-06-03 ENCOUNTER — Ambulatory Visit (INDEPENDENT_AMBULATORY_CARE_PROVIDER_SITE_OTHER): Payer: Medicare Other | Admitting: Pharmacist

## 2022-06-03 DIAGNOSIS — J42 Unspecified chronic bronchitis: Secondary | ICD-10-CM

## 2022-06-03 DIAGNOSIS — I10 Essential (primary) hypertension: Secondary | ICD-10-CM

## 2022-06-03 DIAGNOSIS — E782 Mixed hyperlipidemia: Secondary | ICD-10-CM

## 2022-06-03 NOTE — Progress Notes (Signed)
Chronic Care Management Pharmacy Note  06/04/2022 Name:  Edward Cuevas MRN:  [id number] DOB:  June 29, 1956  Summary: addressed HTN, HLD, COPD. Patient is in the donut hole for Trellegy. In past, offered patient assistance program but patient declined paperwork when his costs reduced again outside of donut hole. Reviewed medications, no other concerns or changes.  Recommendations/Changes made from today's visit: none, again offered patient that we could obtain trellegy via patient assistance to avoid pricing ups and downs. Patient verbalized understanding   Plan:  f/u with pharmacist in 6-8 months  Subjective: Edward Cuevas is an 66 y.o. year old male who is a primary patient of Alden Hipp, Royetta Car, PA-C.  The CCM team was consulted for assistance with disease management and care coordination needs.    Engaged with patient by telephone for follow up visit in response to provider referral for pharmacy case management and/or care coordination services.   Consent to Services:  The patient was given information about Chronic Care Management services, agreed to services, and gave verbal consent prior to initiation of services.  Please see initial visit note for detailed documentation.   Patient Care Team: Lavada Mesi as PCP - General (Family Medicine) Darius Bump, [facility] as Pharmacist (Pharmacist)  Recent office visits:  03/24/21 Iran Planas PA-C PCP- pt was seen for his annual medicare wellness exam. No labs or medication changes were made. Pt advised to f/u as planned.   Recent consult visits:  06/29/21 Golden Circle FNP Infectious Disease- pt was seen for Chronic hepatitis C without hepatic coma. Labs were ordered and pt advised to f/u in 3 months.   03/09/21 Golden Circle FNP Infectious Disease- pt was seen for Chronic hepatitis C without hepatic coma. Labs were ordered and pt advised to f/u Pending blood work and 1 month after start of medication.    Hospital visits:  None in  previous 6 months  Objective:  Lab Results  Component Value Date   CREATININE 0.90 04/27/2021   CREATININE 0.95 02/25/2021   CREATININE 0.86 07/29/2020    Lab Results  Component Value Date   HGBA1C 5.6 02/25/2021   Last diabetic Eye exam: No results found for: "HMDIABEYEEXA"  Last diabetic Foot exam: No results found for: "HMDIABFOOTEX"      Component Value Date/Time   CHOL 130 07/29/2020 1305   CHOL 120 07/16/2019 1151   TRIG 84 07/29/2020 1305   HDL 47 07/29/2020 1305   HDL 43 07/16/2019 1151   CHOLHDL 2.8 07/29/2020 1305   VLDL 13 08/24/2016 1347   LDLCALC 67 07/29/2020 1305       Latest Ref Rng & Units 04/27/2021    1:55 PM 03/09/2021    2:21 PM 02/25/2021    1:40 PM  Hepatic Function  Total Protein 6.1 - 8.1 g/dL 7.1  8.0  7.3   AST 10 - 35 U/L 21  54  55   ALT 9 - 46 U/L 13  77    78  78   Total Bilirubin 0.2 - 1.2 mg/dL 0.4  0.5  0.5   Bilirubin, Direct 0.0 - 0.2 mg/dL  0.2  0.2     Lab Results  Component Value Date/Time   TSH 2.88 04/10/2019 08:11 AM   TSH 1.537 09/19/2014 05:16 PM       Latest Ref Rng & Units 03/09/2021    2:21 PM 04/10/2019    8:11 AM 12/26/2015    2:23 PM  CBC  WBC 3.8 -  10.8 Thousand/uL 4.3  5.8  6.0   Hemoglobin 13.2 - 17.1 g/dL 18.7  17.1  15.8   Hematocrit 38.5 - 50.0 % 54.1  48.7  45.3   Platelets 140 - 400 Thousand/uL 147  198  163     Lab Results  Component Value Date/Time   VD25OH 40 09/19/2014 05:16 PM   VD25OH 24 (L) 04/08/2014 02:36 PM    Clinical ASCVD: Yes  The 10-year ASCVD risk score (Arnett DK, et al., 2019) is: 16.1%   Values used to calculate the score:     Age: 66 years     Sex: Male     Is Non-Hispanic African American: No     Diabetic: No     Tobacco smoker: Yes     Systolic Blood Pressure: 546 mmHg     Is BP treated: No     HDL Cholesterol: 47 mg/dL     Total Cholesterol: 130 mg/dL    Other: (CHADS2VASc if Afib, PHQ9 if depression, MMRC or CAT for COPD, ACT, DEXA)  Social History   Tobacco  Use  Smoking Status Every Day   Packs/day: 1.00   Years: 30.00   Total pack years: 30.00   Types: Cigarettes  Smokeless Tobacco Never   BP Readings from Last 3 Encounters:  03/22/22 132/76  10/13/21 119/72  07/07/21 (!) 146/82   Pulse Readings from Last 3 Encounters:  03/22/22 88  10/13/21 80  07/07/21 69   Wt Readings from Last 3 Encounters:  03/22/22 182 lb (82.6 kg)  10/13/21 190 lb (86.2 kg)  07/07/21 188 lb (85.3 kg)    Assessment: Review of patient past medical history, allergies, medications, health status, including review of consultants reports, laboratory and other test data, was performed as part of comprehensive evaluation and provision of chronic care management services.   SDOH:  (Social Determinants of Health) assessments and interventions performed:    CCM Care Plan  Allergies  Allergen Reactions   Abilify [Aripiprazole]     Feel weird.    Penicillins Itching and Rash    Has patient had a PCN reaction causing immediate rash, facial/tongue/throat swelling, SOB or lightheadedness with hypotension: Yes Has patient had a PCN reaction causing severe rash involving mucus membranes or skin necrosis: No Has patient had a PCN reaction that required hospitalization No Has patient had a PCN reaction occurring within the last 10 years: No If all of the above answers are "NO", then may proceed with Cephalosporin use.     Medications Reviewed Today     Reviewed by Darius Bump, [facility] (Pharmacist) on 06/03/22 at 1158  Med List Status: <None>   Medication Order Taking? Sig Documenting Provider Last Dose Status Informant  acetaminophen (TYLENOL) 650 MG CR tablet [id number] Yes Take 650 mg by mouth 2 (two) times daily. [provider] Taking Active   ALPRAZolam Duanne Moron) 0.5 MG tablet [id number] Yes TAKE 1 TABLET(0.5 MG) BY MOUTH TWICE DAILY Cottle, Billey Co., MD Taking Active   aspirin EC 81 MG tablet [id number] Yes Take 1 tablet (81 mg total) by mouth daily.  Richardo Priest, MD Taking Active   cyclobenzaprine (FLEXERIL) 10 MG tablet [id number] Yes Take 1 tablet (10 mg total) by mouth 3 (three) times daily as needed for muscle spasms. Donella Stade, PA-C Taking Active   DULoxetine (CYMBALTA) 60 MG capsule [id number] Yes Take 1 capsule (60 mg total) by mouth daily. Mozingo, Berdie Ogren, NP Taking Active   hydrocortisone (ANUSOL-HC) 2.5 %  rectal cream [id number] Yes APPLY RECTALLY TO THE AFFECTED AREA TWICE DAILY Breeback, Jade L, PA-C Taking Active   imiquimod (ALDARA) 5 % cream [id number] Yes SMARTSIG:sparingly Topical Every Night [provider] Taking Active   MYRBETRIQ 50 MG TB24 tablet [id number] Yes TAKE 1 TABLET(50 MG) BY MOUTH DAILY Breeback, Jade L, PA-C Taking Active   nitroGLYCERIN (NITROSTAT) 0.4 MG SL tablet [id number]  Place 1 tablet (0.4 mg total) under the tongue every 5 (five) minutes as needed for chest pain. Richardo Priest, MD  Expired 06/23/21 2359   risperiDONE (RISPERDAL) 1 MG tablet [id number] Yes TAKE ONE TABLET AT BEDTIME Mozingo, Berdie Ogren, NP Taking Active   rosuvastatin (CRESTOR) 40 MG tablet [id number] Yes Take 1 tablet (40 mg total) by mouth daily. Take 40 mg by mouth daily. / NEEDS APPOINTMENT FOR FUTURE REFILLS 1ST ATTEMPT Richardo Priest, MD Taking Active   tamsulosin (FLOMAX) 0.4 MG CAPS capsule [id number] Yes TAKE 1 CAPSULE(0.4 MG) BY MOUTH DAILY Donella Stade, PA-C Taking Active   TRELEGY ELLIPTA 100-62.5-25 MCG/ACT AEPB [id number] Yes INHALE 1 PUFF INTO THE LUNGS DAILY Donella Stade, PA-C Taking Active             Patient Active Problem List   Diagnosis Date Noted   Chronic left-sided low back pain with left-sided sciatica 03/22/2022   Positive colorectal cancer screening using Cologuard test 03/10/2021   Chronic hepatitis C without hepatic coma ([place]) 03/09/2021   Hyperlipidemia    Hepatitis    Depression    COPD (chronic obstructive pulmonary disease) (HCC)    Cancer (HCC)    Arthritis     Anxiety    OAB (overactive bladder) 07/12/2019   Dyspnea on exertion 05/31/2019   Centrilobular emphysema ([place]) 05/16/2019   Moderate episode of recurrent major depressive disorder ([place]) 05/16/2019   Aortic atherosclerosis ([place]) 05/16/2019   Coronary artery disease due to calcified coronary lesion 05/16/2019   Pulmonary nodules 05/16/2019   Adrenal adenoma 04/17/2019   Carotid stenosis, right 04/17/2019   Abdominal distension (gaseous) 04/11/2019   Right carotid bruit 04/09/2019   Right lower quadrant abdominal pain 04/09/2019   Current smoker 04/09/2019   Lumbar radiculopathy 01/18/2017   Chronic obstructive pulmonary disease ([place]) 12/29/2016   Fatty liver 10/01/2016   BPH without obstruction/lower urinary tract symptoms 11/25/2015   Chronic pain of multiple joints 11/25/2015   Actinic keratosis 06/27/2015   GAD (generalized anxiety disorder) 11/01/2014   History of hepatitis C 10/08/2014   Right inguinal hernia 10/08/2014   Panic attacks 10/04/2014   DJD 10/23/2013   DDD (degenerative disc disease), lumbosacral 10/23/2013   Mixed hyperlipidemia    Hypertension    Prediabetes    Vitamin D deficiency     Immunization History  Administered Date(s) Administered   Fluad Quad(high Dose 65+) 07/14/2020   Influenza,inj,Quad PF,6+ Mos 07/11/2019   Moderna Sars-Covid-2 Vaccination 12/25/2019, 01/22/2020, 07/07/2020   Pneumococcal Polysaccharide-23 04/07/2009   Tdap 04/12/2011    Conditions to be addressed/monitored: HTN, HLD, and COPD  Care Plan : Medication Management  Updates made by Darius Bump, RPH since 06/04/2022 12:00 AM     Problem: HTN, HLD, COPD      Long-Range Goal: Disease Progression Prevention   Start Date: 07/24/2021  Recent Progress: On track  Priority: High  Note:   Current Barriers:  None at present   Pharmacist Clinical Goal(s):  Over the next 180 days, patient will verbalize ability to afford treatment regimen through collaboration with  PharmD  and provider.   Interventions: 1:1 collaboration with Donella Stade, PA-C regarding development and update of comprehensive plan of care as evidenced by provider attestation and co-signature Inter-disciplinary care team collaboration (see longitudinal plan of care) Comprehensive medication review performed; medication list updated in electronic medical record  Hypertension:  Controlled; current treatment:lifestyle modifications only;   Current home readings: not currently checking  Denies hypotensive/hypertensive symptoms  Recommended continue current regimen,  Hyperlipidemia:  Controlled; current treatment:rosuvastatin '40mg'$  daily;   Recommended continue current regimen  Chronic Obstructive Pulmonary Disease:  Controlled; current treatment:trelegy daily (though patient reported taking it PRN instead of routinely on a daily basis to make it last longer due to cost;   0 exacerbations requiring treatment in the last 6 months   Recommended continue current regimen Assessed patient finances. Patient may be eligible for patient assistance, he requested we mail the forms + return envelope to him to fill out.   Patient Goals/Self-Care Activities Over the next 180 days, patient will:  collaborate with provider on medication access solutions  Follow Up Plan: Telephone follow up appointment with care management team member scheduled for:  6-8 months       Medication Assistance: Application for trelegy  medication assistance program. in process.  Anticipated assistance start date TBD.  See plan of care for additional detail.  Patient's preferred pharmacy is:  [id number] [address], [place] AT [address] [place] [address] Phone: [phone] Fax: [phone]  [address] [place] [address] Phone: [phone] Fax: [phone]   Uses pill box? No - has  a system with tipping cap over and for AM/PM Pt endorses 100% compliance  Follow Up:  Patient agrees to Care Plan and Follow-up.  Plan: Telephone follow up appointment with care management team member scheduled for:  6-8 month  Larinda Buttery, PharmD Clinical Pharmacist [facility] [phone]

## 2022-06-04 NOTE — Patient Instructions (Signed)
Visit Information  Thank you for taking time to visit with me today. Please don't hesitate to contact me if I can be of assistance to you before our next scheduled telephone appointment.  Following are the goals we discussed today:   Patient Goals/Self-Care Activities Over the next 180 days, patient will:  collaborate with provider on medication access solutions  Follow Up Plan: Telephone follow up appointment with care management team member scheduled for:  6-8 months  Please call the care guide team at [phone] if you need to cancel or reschedule your appointment.   Patient verbalizes understanding of instructions and care plan provided today and agrees to view in [place]. Active MyChart status and patient understanding of how to access instructions and care plan via MyChart confirmed with patient.     Edward Cuevas

## 2022-06-09 ENCOUNTER — Encounter: Payer: Self-pay | Admitting: General Practice

## 2022-06-13 ENCOUNTER — Other Ambulatory Visit: Payer: Self-pay | Admitting: Psychiatry

## 2022-06-13 DIAGNOSIS — F410 Panic disorder [episodic paroxysmal anxiety] without agoraphobia: Secondary | ICD-10-CM

## 2022-06-14 ENCOUNTER — Ambulatory Visit (INDEPENDENT_AMBULATORY_CARE_PROVIDER_SITE_OTHER): Payer: Medicare Other | Admitting: Adult Health

## 2022-06-14 ENCOUNTER — Encounter: Payer: Self-pay | Admitting: Adult Health

## 2022-06-14 DIAGNOSIS — F410 Panic disorder [episodic paroxysmal anxiety] without agoraphobia: Secondary | ICD-10-CM

## 2022-06-14 DIAGNOSIS — F331 Major depressive disorder, recurrent, moderate: Secondary | ICD-10-CM

## 2022-06-14 DIAGNOSIS — G4700 Insomnia, unspecified: Secondary | ICD-10-CM | POA: Diagnosis not present

## 2022-06-14 DIAGNOSIS — F411 Generalized anxiety disorder: Secondary | ICD-10-CM | POA: Diagnosis not present

## 2022-06-14 MED ORDER — DULOXETINE HCL 60 MG PO CPEP: 60.0000 mg | ORAL_CAPSULE | Freq: Every day | ORAL | 5 refills | Status: DC

## 2022-06-14 MED ORDER — ALPRAZOLAM 0.5 MG PO TABS: ORAL_TABLET | ORAL | 2 refills | Status: DC

## 2022-06-14 MED ORDER — RISPERIDONE 1 MG PO TABS: ORAL_TABLET | ORAL | 5 refills | Status: DC

## 2022-06-14 NOTE — Progress Notes (Signed)
Edward Cuevas [id number] 12-15-1955 66 y.o.  Subjective:   Patient ID:  Edward Cuevas is a 66 y.o. (DOB Jan 25, 1956) male.  Chief Complaint: No chief complaint on file.   HPI Cabell Lazenby presents to the office today for follow-up of MDD, anxiety, panic attacks, and insomnia.  Describes mood today as "ok". Pleasant. Mood symptoms - denies depression, anxiety, and irritability. Denies panic attacks. Denies worry and ruminations. Stating "I'm doing ok". Reports some struggles after losing his dog of 15 years. Feels like current medications work well for him. Stable interest and motivation. Taking medications as prescribed.  Energy levels stable. Active, does not have a regular exercise routine with physical disabilities.  Enjoys some usual interests and activities. Divorced. Lives with ex wife. Has a 76 year old son locally and another son in [place]. Has a sister in [place]. Spending time with family. Appetite adequate. Weight stable - 180 pounds. Sleeps well most nights. Averages 6 hours. Focus and concentration stable. Completing tasks. Managing aspects of household. Receiving disability benefits/retirement. Retired Engineer, mining.  Denies SI or HI.  Denies AH or VH.  Previous medication trials: Cymbalta, Wellbutrin   AUDIT    Flowsheet Row Office Visit from 03/24/2021 in [place]  Alcohol Use Disorder Identification Test Final Score (AUDIT) 9      GAD-7    Flowsheet Row Office Visit from 07/14/2020 in [place] Office Visit from 01/09/2020 in [place] Office Visit from 11/28/2019 in [place] Visit from 07/11/2019 in [place] Visit from 05/16/2019 in [place]  Total GAD-7 Score 0 6 3 0 1      PHQ2-9    [place] Office Visit from 10/13/2021 in  [facility] Office Visit from 06/29/2021 in [facility] for Infectious Disease Office Visit from 03/24/2021 in [place] Office Visit from 07/14/2020 in [place] Office Visit from 01/09/2020 in [place]  PHQ-2 Total Score 0 0 0 0 4  PHQ-9 Total Score -- -- 0 4 10        Review of Systems:  Review of Systems  Musculoskeletal:  Negative for gait problem.  Neurological:  Negative for tremors.  Psychiatric/Behavioral:         Please refer to HPI    Medications: I have reviewed the patient's current medications.  Current Outpatient Medications  Medication Sig Dispense Refill   acetaminophen (TYLENOL) 650 MG CR tablet Take 650 mg by mouth 2 (two) times daily.     ALPRAZolam (XANAX) 0.5 MG tablet TAKE 1 TABLET(0.5 MG) BY MOUTH TWICE DAILY 60 tablet 2   aspirin EC 81 MG tablet Take 1 tablet (81 mg total) by mouth daily. 90 tablet 3   cyclobenzaprine (FLEXERIL) 10 MG tablet Take 1 tablet (10 mg total) by mouth 3 (three) times daily as needed for muscle spasms. 30 tablet 0   DULoxetine (CYMBALTA) 60 MG capsule Take 1 capsule (60 mg total) by mouth daily. 30 capsule 5   hydrocortisone (ANUSOL-HC) 2.5 % rectal cream APPLY RECTALLY TO THE AFFECTED AREA TWICE DAILY 60 g 1   imiquimod (ALDARA) 5 % cream SMARTSIG:sparingly Topical Every Night     MYRBETRIQ 50 MG TB24 tablet TAKE 1 TABLET(50  MG) BY MOUTH DAILY 90 tablet 0   nitroGLYCERIN (NITROSTAT) 0.4 MG SL tablet Place 1 tablet (0.4 mg total) under the tongue every 5 (five) minutes as needed for chest pain. 25 tablet 3   risperiDONE (RISPERDAL) 1 MG tablet TAKE ONE TABLET AT BEDTIME 30 tablet 5   rosuvastatin (CRESTOR) 40 MG tablet Take 1 tablet (40 mg total) by mouth daily. Take 40 mg by mouth daily. / NEEDS APPOINTMENT FOR FUTURE REFILLS 1ST ATTEMPT 90 tablet 0   tamsulosin (FLOMAX) 0.4  MG CAPS capsule TAKE 1 CAPSULE(0.4 MG) BY MOUTH DAILY 90 capsule 0   TRELEGY ELLIPTA 100-62.5-25 MCG/ACT AEPB INHALE 1 PUFF INTO THE LUNGS DAILY 60 each 5   No current facility-administered medications for this visit.    Medication Side Effects: None  Allergies:  Allergies  Allergen Reactions   Abilify [Aripiprazole]     Feel weird.    Penicillins Itching and Rash    Has patient had a PCN reaction causing immediate rash, facial/tongue/throat swelling, SOB or lightheadedness with hypotension: Yes Has patient had a PCN reaction causing severe rash involving mucus membranes or skin necrosis: No Has patient had a PCN reaction that required hospitalization No Has patient had a PCN reaction occurring within the last 10 years: No If all of the above answers are "NO", then may proceed with Cephalosporin use.     Past Medical History:  Diagnosis Date   Anxiety    Arthritis    Cancer ([place])    skin   COPD (chronic obstructive pulmonary disease) ([place])    per pt ?   Depression    Hepatitis    A,B,C   Hyperlipidemia    Hypertension    Prediabetes    Vitamin D deficiency     Past Medical History, Surgical history, Social history, and Family history were reviewed and updated as appropriate.   Please see review of systems for further details on the patient's review from today.   Objective:   Physical Exam:  There were no vitals taken for this visit.  Physical Exam Constitutional:      General: He is not in acute distress. Musculoskeletal:        General: No deformity.  Neurological:     Mental Status: He is alert and oriented to person, place, and time.     Coordination: Coordination normal.  Psychiatric:        Attention and Perception: Attention and perception normal. He does not perceive auditory or visual hallucinations.        Mood and Affect: Mood normal. Mood is not anxious or depressed. Affect is not labile, blunt, angry or inappropriate.        Speech: Speech  normal.        Behavior: Behavior normal.        Thought Content: Thought content normal. Thought content is not paranoid or delusional. Thought content does not include homicidal or suicidal ideation. Thought content does not include homicidal or suicidal plan.        Cognition and Memory: Cognition and memory normal.        Judgment: Judgment normal.     Comments: Insight intact     Lab Review:     Component Value Date/Time   NA 137 04/27/2021 1355   NA 136 07/16/2019 1151   K 5.1 04/27/2021 1355   CL 103 04/27/2021 1355   CO2 32 04/27/2021 1355   GLUCOSE 103 (H) 04/27/2021 1355   BUN 11 04/27/2021 1355  BUN 10 07/16/2019 1151   CREATININE 0.90 04/27/2021 1355   CALCIUM 9.6 04/27/2021 1355   PROT 7.1 04/27/2021 1355   PROT 7.2 07/16/2019 1151   ALBUMIN 4.6 07/16/2019 1151   AST 21 04/27/2021 1355   ALT 13 04/27/2021 1355   ALT 77 (H) 03/09/2021 1421   ALKPHOS 60 07/16/2019 1151   BILITOT 0.4 04/27/2021 1355   BILITOT 0.2 07/16/2019 1151   GFRNONAA 83 02/25/2021 1340   GFRAA 96 02/25/2021 1340       Component Value Date/Time   WBC 4.3 03/09/2021 1421   RBC 5.79 03/09/2021 1421   HGB 18.7 (H) 03/09/2021 1421   HCT 54.1 (H) 03/09/2021 1421   PLT 147 03/09/2021 1421   MCV 93.4 03/09/2021 1421   MCH 32.3 03/09/2021 1421   MCHC 34.6 03/09/2021 1421   RDW 12.5 03/09/2021 1421   LYMPHSABS 2,627 04/10/2019 0811   MONOABS 0.4 09/19/2014 1716   EOSABS 81 04/10/2019 0811   BASOSABS 58 04/10/2019 0811    No results found for: "POCLITH", "LITHIUM"   No results found for: "PHENYTOIN", "PHENOBARB", "VALPROATE", "CBMZ"   .res Assessment: Plan:    Plan:  PDMP reviewed  1. Cymbalta '60mg'$  daily   2. Risperdal '1mg'$  at hs  3. Xanax 0.'5mg'$  at bedtime to BID  Read and reviewed note with patient for accuracy.   RTC 6 months  Patient advised to contact office with any questions, adverse effects, or acute worsening in signs and symptoms.  Discussed potential metabolic  side effects associated with atypical antipsychotics, as well as potential risk for movement side effects. Advised pt to contact office if movement side effects occur.   Diagnoses and all orders for this visit:  Panic attacks -     ALPRAZolam (XANAX) 0.5 MG tablet; TAKE 1 TABLET(0.5 MG) BY MOUTH TWICE DAILY  Moderate episode of recurrent major depressive disorder (HCC) -     DULoxetine (CYMBALTA) 60 MG capsule; Take 1 capsule (60 mg total) by mouth daily.  Generalized anxiety disorder -     DULoxetine (CYMBALTA) 60 MG capsule; Take 1 capsule (60 mg total) by mouth daily.  Insomnia, unspecified type -     risperiDONE (RISPERDAL) 1 MG tablet; TAKE ONE TABLET AT BEDTIME     Please see After Visit Summary for patient specific instructions.  Future Appointments  Date Time Provider [place]  02/17/2023  1:30 PM [facility] PHARMACIST [facility] None    No orders of the defined types were placed in this encounter.   -------------------------------

## 2022-06-17 DIAGNOSIS — I10 Essential (primary) hypertension: Secondary | ICD-10-CM | POA: Diagnosis not present

## 2022-06-17 DIAGNOSIS — J449 Chronic obstructive pulmonary disease, unspecified: Secondary | ICD-10-CM | POA: Diagnosis not present

## 2022-06-17 DIAGNOSIS — E785 Hyperlipidemia, unspecified: Secondary | ICD-10-CM | POA: Diagnosis not present

## 2022-06-29 ENCOUNTER — Other Ambulatory Visit: Payer: Self-pay | Admitting: Cardiology

## 2022-06-29 NOTE — Telephone Encounter (Signed)
Rosuvastatin 40 mg # 30 tablets only with message to patient and pharmacy needs appointment for future refills / 2nd attempt Sent to [facility] # [phone]

## 2022-07-07 ENCOUNTER — Other Ambulatory Visit: Payer: Self-pay | Admitting: Physician Assistant

## 2022-07-07 DIAGNOSIS — N3281 Overactive bladder: Secondary | ICD-10-CM

## 2022-08-02 ENCOUNTER — Telehealth: Payer: Self-pay | Admitting: Cardiology

## 2022-08-02 ENCOUNTER — Other Ambulatory Visit: Payer: Self-pay | Admitting: Cardiology

## 2022-08-02 NOTE — Telephone Encounter (Signed)
*  STAT* If patient is at the pharmacy, call can be transferred to refill team.   1. Which medications need to be refilled? (please list name of each medication and dose if known) rosuvastatin (CRESTOR) 40 MG tablet  2. Which pharmacy/location (including street and city if local pharmacy) is medication to be sent to? [facility] [address], [place] AT [address]  3. Do they need a 30 day or 90 day supply? 30 day   Patient is completely out of medication. He has an appointment 08/25/2022.

## 2022-08-02 NOTE — Telephone Encounter (Signed)
Refill to pharmacy 

## 2022-08-02 NOTE — Telephone Encounter (Signed)
Refill denied, refill previous sent to pharmacy. Patient has f/u appt scheduled 08/25/22 with [facility]

## 2022-08-13 ENCOUNTER — Other Ambulatory Visit: Payer: Self-pay

## 2022-08-25 ENCOUNTER — Encounter: Payer: Self-pay | Admitting: Cardiology

## 2022-08-25 ENCOUNTER — Ambulatory Visit: Payer: Medicare Other | Attending: Cardiology | Admitting: Cardiology

## 2022-08-25 VITALS — BP 134/68 | HR 82 | Ht 69.0 in | Wt 180.0 lb

## 2022-08-25 DIAGNOSIS — F17200 Nicotine dependence, unspecified, uncomplicated: Secondary | ICD-10-CM

## 2022-08-25 DIAGNOSIS — I700 Atherosclerosis of aorta: Secondary | ICD-10-CM | POA: Diagnosis not present

## 2022-08-25 DIAGNOSIS — E559 Vitamin D deficiency, unspecified: Secondary | ICD-10-CM | POA: Diagnosis not present

## 2022-08-25 DIAGNOSIS — E782 Mixed hyperlipidemia: Secondary | ICD-10-CM

## 2022-08-25 DIAGNOSIS — I6521 Occlusion and stenosis of right carotid artery: Secondary | ICD-10-CM

## 2022-08-25 DIAGNOSIS — I10 Essential (primary) hypertension: Secondary | ICD-10-CM

## 2022-08-25 DIAGNOSIS — R7303 Prediabetes: Secondary | ICD-10-CM

## 2022-08-25 NOTE — Patient Instructions (Signed)
Medication Instructions:  Your physician recommends that you continue on your current medications as directed. Please refer to the Current Medication list given to you today.  *If you need a refill on your cardiac medications before your next appointment, please call your pharmacy*   Lab Work: Your physician recommends that you return for lab work in: the next few day You need to have labs done when you are fasting. You do not need to make an appointment as the order has already been placed. The labs you are going to have done are BMET, CBC, TSH, vitamin D, A1C, LFT and Lipids.  [address] in [place]. They also close daily for lunch for 12-1.  or [address] 2nd floor M-W 8-11:30 and 1-4:30 and Thursday and Friday 8-11:30.  If you have labs (blood work) drawn today and your tests are completely normal, you will receive your results only by: [facility] (if you have MyChart) OR A paper copy in the mail If you have any lab test that is abnormal or we need to change your treatment, we will call you to review the results.   Testing/Procedures: Your physician has requested that you have an abdominal aorta duplex. During this test, an ultrasound is used to evaluate the aorta. Allow 30 minutes for this exam. Do not eat after midnight the day before and avoid carbonated beverages.  Your physician has requested that you have a carotid duplex. This test is an ultrasound of the carotid arteries in your neck. It looks at blood flow through these arteries that supply the brain with blood. Allow one hour for this exam. There are no restrictions or special instructions.  Follow-Up: At [facility], you and your health needs are our priority.  As part of our continuing mission to provide you with exceptional heart care, we have created designated Provider Care Teams.  These Care Teams include your primary Cardiologist (physician) and Advanced Practice  Providers (APPs -  Physician Assistants and Nurse Practitioners) who all work together to provide you with the care you need, when you need it.  We recommend signing up for the patient portal called "MyChart".  Sign up information is provided on this After Visit Summary.  MyChart is used to connect with patients for Virtual Visits (Telemedicine).  Patients are able to view lab/test results, encounter notes, upcoming appointments, etc.  Non-urgent messages can be sent to your provider as well.   To learn more about what you can do with MyChart, go to [email or web address].    Your next appointment:   9 month(s)  The format for your next appointment:   In Person  Provider:   Jyl Heinz, MD   Other Instructions NA

## 2022-08-25 NOTE — Progress Notes (Signed)
Cardiology Office Note:    Date:  08/25/2022   ID:  Edward Cuevas, DOB 10-06-1956, MRN [id number]  PCP:  Donella Stade, PA-C  Cardiologist:  Jenean Lindau, MD   Referring MD: Donella Stade, PA-C    ASSESSMENT:    1. Aortic atherosclerosis ([place])   2. Primary hypertension   3. Current smoker   4. Vitamin D deficiency   5. Carotid stenosis, right    PLAN:    In order of problems listed above:  Coronary artery disease: Secondary prevention stressed with patient.  Importance of compliance with diet medication stressed and vocalized understanding.  He was advised to ambulate to the best of his ability. Cigarette smoker: I spent 5 minutes with the patient discussing solely about smoking. Smoking cessation was counseled. I suggested to the patient also different medications and pharmacological interventions. Patient is keen to try stopping on its own at this time. He will get back to me if he needs any further assistance in this matter. Essential hypertension: Blood pressure stable and diet was emphasized. Mixed: On lipid-lowering medications he will come back for blood work in the next few days. Cardiac murmur: Echocardiogram will be done to assess murmur heard on auscultation. Abdomen abnormal pulsations: We will assess with ultrasound to rule out abdominal aortic aneurysm. Patient will be seen in follow-up appointment in 6 months or earlier if the patient has any concerns    Medication Adjustments/Labs and Tests Ordered: Current medicines are reviewed at length with the patient today.  Concerns regarding medicines are outlined above.  No orders of the defined types were placed in this encounter.  No orders of the defined types were placed in this encounter.    No chief complaint on file.    History of Present Illness:    Edward Cuevas is a 66 y.o. male.  Patient has past medical history of coronary artery disease, aortic atherosclerosis, essential hypertension, mixed  dyslipidemia: Carotid atherosclerosis and unfortunately continues to smoke.  He denies any chest pain orthopnea or PND.  Overall he leads a sedentary lifestyle.  At the time of my evaluation, the patient is alert awake oriented and in no distress.  This is the first time I am seeing him.  He is followed by Dr. Bettina Gavia in the past.  Past Medical History:  Diagnosis Date   Abdominal distension (gaseous) 04/11/2019   Actinic keratosis 06/27/2015   Adrenal adenoma 04/17/2019   Bilateral CT abd 03/2019    Anxiety    Aortic atherosclerosis ([place]) 05/16/2019   Arthritis    BPH without obstruction/lower urinary tract symptoms 11/25/2015   Cancer ([place])    skin   Carotid stenosis, right 04/17/2019   50-69 percent narrowed. Doppler 03/2019.    Centrilobular emphysema ([place]) 05/16/2019   Chronic hepatitis C without hepatic coma ([place]) 03/09/2021   Chronic left-sided low back pain with left-sided sciatica 03/22/2022   Chronic obstructive pulmonary disease ([place]) 12/29/2016   Chronic pain of multiple joints 11/25/2015   COPD (chronic obstructive pulmonary disease) ([place])    per pt ?   Coronary artery disease due to calcified coronary lesion 05/16/2019   Current smoker 04/09/2019   DDD (degenerative disc disease), lumbosacral 10/23/2013   opiod risk 1, low risk.  Failed UDS 05/2017 positive for marijuanna/alcohol.    Depression    DJD 10/23/2013   Dyspnea on exertion 05/31/2019   Fatty liver 10/01/2016   Via U/S 09/2016.    GAD (generalized anxiety disorder) 11/01/2014   Hepatitis  A,B,C   History of hepatitis C 10/08/2014   Per pt cured.   Hyperlipidemia    Hypertension    Lumbar radiculopathy 01/18/2017   Mixed hyperlipidemia    Moderate episode of recurrent major depressive disorder ([place]) 05/16/2019   OAB (overactive bladder) 07/12/2019   Panic attacks 10/04/2014   Positive colorectal cancer screening using Cologuard test 03/10/2021   Prediabetes    Pulmonary nodules 05/16/2019   Right carotid bruit 04/09/2019    Right inguinal hernia 10/08/2014   Right lower quadrant abdominal pain 04/09/2019   Vitamin D deficiency     Past Surgical History:  Procedure Laterality Date   COLONOSCOPY  2011   HPP   ELBOW SURGERY Right 10/18/1984   HEMORRHOID SURGERY  1977 and [place] Left 10/18/1994   INGUINAL HERNIA REPAIR Right 12/30/2015   Procedure: RIGHT RIGHT INGUINAL HERNIA REPAIR;  Surgeon: Ralene Ok, MD;  Location: [facility];  Service: General;  Laterality: Right;   INSERTION OF MESH Right 12/30/2015   Procedure: INSERTION OF MESH;  Surgeon: Ralene Ok, MD;  Location: [facility];  Service: General;  Laterality: Right;   POLYPECTOMY  2011   [facility]   SHOULDER SURGERY Left 10/18/2005    Current Medications: Current Meds  Medication Sig   acetaminophen (TYLENOL) 650 MG CR tablet Take 650 mg by mouth 2 (two) times daily.   ALPRAZolam (XANAX) 0.5 MG tablet TAKE 1 TABLET(0.5 MG) BY MOUTH TWICE DAILY   aspirin EC 81 MG tablet Take 1 tablet (81 mg total) by mouth daily.   cyclobenzaprine (FLEXERIL) 10 MG tablet Take 1 tablet (10 mg total) by mouth 3 (three) times daily as needed for muscle spasms.   DULoxetine (CYMBALTA) 60 MG capsule Take 1 capsule (60 mg total) by mouth daily.   hydrocortisone (ANUSOL-HC) 2.5 % rectal cream APPLY RECTALLY TO THE AFFECTED AREA TWICE DAILY   imiquimod (ALDARA) 5 % cream Apply 1 Application topically at bedtime.   MYRBETRIQ 50 MG TB24 tablet Take 1 tablet (50 mg total) by mouth daily. Appt for refills   nitroGLYCERIN (NITROSTAT) 0.4 MG SL tablet Place 0.4 mg under the tongue every 5 (five) minutes as needed for chest pain.   risperiDONE (RISPERDAL) 1 MG tablet TAKE ONE TABLET AT BEDTIME   rosuvastatin (CRESTOR) 40 MG tablet TAKE 1 TABLET(40 MG) BY MOUTH DAILY   tamsulosin (FLOMAX) 0.4 MG CAPS capsule Take 1 capsule (0.4 mg total) by mouth daily. Appt for refills   TRELEGY ELLIPTA 100-62.5-25 MCG/ACT AEPB INHALE 1 PUFF INTO THE LUNGS DAILY     Allergies:    Abilify [aripiprazole] and Penicillins   Social History   Socioeconomic History   Marital status: Divorced    Spouse name: Not on file   Number of children: 2   Years of education: 11   Highest education level: 11th grade  Occupational History   Occupation: superintendant    Comment: retired  Tobacco Use   Smoking status: Every Day    Packs/day: 1.00    Years: 30.00    Total pack years: 30.00    Types: Cigarettes   Smokeless tobacco: Never  Vaping Use   Vaping Use: Some days  Substance and Sexual Activity   Alcohol use: Yes    Alcohol/week: 4.0 standard drinks of alcohol    Types: 4 Cans of beer per week    Comment: 4 beers daily   Drug use: No   Sexual activity: Not Currently  Other Topics Concern  Not on file  Social History Narrative   Lives with his ex-wife. He enjoys watching television.   Social Determinants of Health   Financial Resource Strain: Low Risk  (03/24/2021)   Overall Financial Resource Strain (CARDIA)    Difficulty of Paying Living Expenses: Not hard at all  Food Insecurity: No Food Insecurity (03/24/2021)   Hunger Vital Sign    Worried About Running Out of Food in the Last Year: Never true    Ran Out of Food in the Last Year: Never true  Transportation Needs: No Transportation Needs (03/24/2021)   PRAPARE - Hydrologist (Medical): No    Lack of Transportation (Non-Medical): No  Physical Activity: Inactive (03/24/2021)   Exercise Vital Sign    Days of Exercise per Week: 0 days    Minutes of Exercise per Session: 0 min  Stress: No Stress Concern Present (03/24/2021)   [place]    Feeling of Stress : Not at all  Social Connections: Socially Isolated (03/24/2021)   Social Connection and Isolation Panel [NHANES]    Frequency of Communication with Friends and Family: More than three times a week    Frequency of Social Gatherings with Friends and Family: Never     Attends Religious Services: Never    Marine scientist or Organizations: No    Attends Music therapist: Never    Marital Status: Divorced     Family History: The patient's family history includes Diabetes in his mother; Heart attack in his father; Heart disease in his father and maternal grandmother. There is no history of Colon cancer, Colon polyps, Esophageal cancer, Stomach cancer, or Rectal cancer.  ROS:   Please see the history of present illness.    All other systems reviewed and are negative.  EKGs/Labs/Other Studies Reviewed:    The following studies were reviewed today: EKG reveals sinus rhythm with poor anterior forces and nonspecific ST changes   Recent Labs: No results found for requested labs within last 365 days.  Recent Lipid Panel    Component Value Date/Time   CHOL 130 07/29/2020 1305   CHOL 120 07/16/2019 1151   TRIG 84 07/29/2020 1305   HDL 47 07/29/2020 1305   HDL 43 07/16/2019 1151   CHOLHDL 2.8 07/29/2020 1305   VLDL 13 08/24/2016 1347   LDLCALC 67 07/29/2020 1305    Physical Exam:    VS:  BP 134/68   Pulse 82   Ht '5\' 9"'$  (1.753 m)   Wt 180 lb 0.6 oz (81.7 kg)   SpO2 (!) 87%   BMI 26.59 kg/m     Wt Readings from Last 3 Encounters:  08/25/22 180 lb 0.6 oz (81.7 kg)  03/22/22 182 lb (82.6 kg)  10/13/21 190 lb (86.2 kg)     GEN: Patient is in no acute distress HEENT: Normal NECK: No JVD; No carotid bruits LYMPHATICS: No lymphadenopathy CARDIAC: Hear sounds regular, 2/6 systolic murmur at the apex. RESPIRATORY:  Clear to auscultation without rales, wheezing or rhonchi  ABDOMEN: Soft, non-tender, non-distended MUSCULOSKELETAL:  No edema; No deformity  SKIN: Warm and dry NEUROLOGIC:  Alert and oriented x 3 PSYCHIATRIC:  Normal affect   Signed, Jenean Lindau, MD  08/25/2022 1:26 PM    [facility]

## 2022-08-31 ENCOUNTER — Other Ambulatory Visit: Payer: Self-pay | Admitting: Cardiology

## 2022-09-12 ENCOUNTER — Other Ambulatory Visit: Payer: Self-pay | Admitting: Adult Health

## 2022-09-12 DIAGNOSIS — F410 Panic disorder [episodic paroxysmal anxiety] without agoraphobia: Secondary | ICD-10-CM

## 2022-09-13 ENCOUNTER — Ambulatory Visit (HOSPITAL_COMMUNITY)
Admission: RE | Admit: 2022-09-13 | Discharge: 2022-09-13 | Disposition: A | Discharge status: Home / Self Care | Payer: Medicare Other | Source: Ambulatory Visit | Attending: Cardiology | Admitting: Cardiology

## 2022-09-13 ENCOUNTER — Ambulatory Visit (HOSPITAL_BASED_OUTPATIENT_CLINIC_OR_DEPARTMENT_OTHER)
Admission: RE | Admit: 2022-09-13 | Discharge: 2022-09-13 | Disposition: A | Discharge status: Home / Self Care | Payer: Medicare Other | Source: Ambulatory Visit | Attending: Cardiology | Admitting: Cardiology

## 2022-09-13 DIAGNOSIS — F17200 Nicotine dependence, unspecified, uncomplicated: Secondary | ICD-10-CM | POA: Insufficient documentation

## 2022-09-13 DIAGNOSIS — I6521 Occlusion and stenosis of right carotid artery: Secondary | ICD-10-CM

## 2022-09-13 DIAGNOSIS — R7303 Prediabetes: Secondary | ICD-10-CM | POA: Insufficient documentation

## 2022-09-13 DIAGNOSIS — I700 Atherosclerosis of aorta: Secondary | ICD-10-CM | POA: Insufficient documentation

## 2022-09-14 LAB — BASIC METABOLIC PANEL
BUN/Creatinine Ratio: 9 — ABNORMAL LOW (ref 10–24)
BUN: 9 mg/dL (ref 8–27)
CO2: 24 mmol/L (ref 20–29)
Calcium: 9.4 mg/dL (ref 8.6–10.2)
Chloride: 101 mmol/L (ref 96–106)
Creatinine, Ser: 1.02 mg/dL (ref 0.76–1.27)
Glucose: 111 mg/dL — ABNORMAL HIGH (ref 70–99)
Potassium: 4.7 mmol/L (ref 3.5–5.2)
Sodium: 137 mmol/L (ref 134–144)
eGFR: 81 mL/min/{1.73_m2} (ref >59)

## 2022-09-14 LAB — HEPATIC FUNCTION PANEL
ALT: 10 IU/L (ref 0–44)
AST: 21 IU/L (ref 0–40)
Albumin: 4.5 g/dL (ref 3.9–4.9)
Alkaline Phosphatase: 40 IU/L — ABNORMAL LOW (ref 44–121)
Bilirubin Total: 0.4 mg/dL (ref 0.0–1.2)
Bilirubin, Direct: 0.16 mg/dL (ref 0.00–0.40)
Total Protein: 6.9 g/dL (ref 6.0–8.5)

## 2022-09-14 LAB — LIPID PANEL
Chol/HDL Ratio: 2.9 ratio (ref 0.0–5.0)
Cholesterol, Total: 113 mg/dL (ref 100–199)
HDL: 39 mg/dL — ABNORMAL LOW (ref >39)
LDL Chol Calc (NIH): 60 mg/dL (ref 0–99)
Triglycerides: 67 mg/dL (ref 0–149)
VLDL Cholesterol Cal: 14 mg/dL (ref 5–40)

## 2022-09-14 LAB — TSH: TSH: 2.94 u[IU]/mL (ref 0.450–4.500)

## 2022-09-14 LAB — CBC
Hematocrit: 52.3 % — ABNORMAL HIGH (ref 37.5–51.0)
Hemoglobin: 18 g/dL — ABNORMAL HIGH (ref 13.0–17.7)
MCH: 30.8 pg (ref 26.6–33.0)
MCHC: 34.4 g/dL (ref 31.5–35.7)
MCV: 90 fL (ref 79–97)
Platelets: 177 10*3/uL (ref 150–450)
RBC: 5.84 x10E6/uL — ABNORMAL HIGH (ref 4.14–5.80)
RDW: 12.7 % (ref 11.6–15.4)
WBC: 6.5 10*3/uL (ref 3.4–10.8)

## 2022-09-14 LAB — HEMOGLOBIN A1C
Est. average glucose Bld gHb Est-mCnc: 123 mg/dL
Hgb A1c MFr Bld: 5.9 % — ABNORMAL HIGH (ref 4.8–5.6)

## 2022-09-14 LAB — VITAMIN D 25 HYDROXY (VIT D DEFICIENCY, FRACTURES): Vit D, 25-Hydroxy: 12.5 ng/mL — ABNORMAL LOW (ref 30.0–100.0)

## 2022-09-16 NOTE — Progress Notes (Signed)
Hi Izeyah, I am covering for Luvenia Starch while she is out of the office.  Your labs were forwarded to me from Dr. Geraldo Pitter.  Your vitamin D is low.  I recommend starting 50 mcg daily over-the-counter.  Plan will be to recheck your vitamin D level in about 2 to 3 months to make sure that you are absorbing it and that your levels are coming up.  Also your hemoglobin A1c is elevated.  This is a 91-monthmeasure of your blood sugars and indicates that you are actually in the prediabetes range.  I would really encourage you to cut back on intake of sweets, sugars, and carbohydrates and work on routine exercise.  The plan will be to recheck your A1c in about 3 to 4 months to see if we can make improvements with just some lifestyle changes.  Please schedule a follow-up with JLuvenia Starchif you do not already have 1 in about 3 months.

## 2022-09-26 ENCOUNTER — Other Ambulatory Visit: Payer: Self-pay | Admitting: Physician Assistant

## 2022-09-27 ENCOUNTER — Telehealth: Payer: Self-pay

## 2022-09-27 MED ORDER — TRELEGY ELLIPTA 100-62.5-25 MCG/ACT IN AEPB: 1.0000 | INHALATION_SPRAY | Freq: Every day | RESPIRATORY_TRACT | 0 refills | Status: DC

## 2022-09-27 NOTE — Telephone Encounter (Signed)
Patient assistance forms completed. Patient made aware. Will pick up forms from the front desk and complete his portion. He will either send to company or bring back to [place] to send to them.

## 2022-10-12 ENCOUNTER — Telehealth: Payer: Self-pay | Admitting: Physician Assistant

## 2022-10-12 DIAGNOSIS — N3281 Overactive bladder: Secondary | ICD-10-CM

## 2022-10-13 NOTE — Telephone Encounter (Signed)
Patient scheduled for 10/25/2022 for medication refill.

## 2022-10-19 ENCOUNTER — Other Ambulatory Visit: Payer: Self-pay | Admitting: Physician Assistant

## 2022-10-25 ENCOUNTER — Encounter: Payer: Self-pay | Admitting: Physician Assistant

## 2022-10-25 ENCOUNTER — Ambulatory Visit (INDEPENDENT_AMBULATORY_CARE_PROVIDER_SITE_OTHER): Payer: Medicare Other | Admitting: Physician Assistant

## 2022-10-25 VITALS — BP 156/76 | HR 97 | Ht 69.0 in | Wt 184.0 lb

## 2022-10-25 DIAGNOSIS — N3281 Overactive bladder: Secondary | ICD-10-CM | POA: Diagnosis not present

## 2022-10-25 DIAGNOSIS — F331 Major depressive disorder, recurrent, moderate: Secondary | ICD-10-CM | POA: Diagnosis not present

## 2022-10-25 DIAGNOSIS — Z5320 Procedure and treatment not carried out because of patient's decision for unspecified reasons: Secondary | ICD-10-CM

## 2022-10-25 DIAGNOSIS — I10 Essential (primary) hypertension: Secondary | ICD-10-CM

## 2022-10-25 DIAGNOSIS — E782 Mixed hyperlipidemia: Secondary | ICD-10-CM

## 2022-10-25 DIAGNOSIS — F411 Generalized anxiety disorder: Secondary | ICD-10-CM | POA: Diagnosis not present

## 2022-10-25 DIAGNOSIS — G4700 Insomnia, unspecified: Secondary | ICD-10-CM

## 2022-10-25 DIAGNOSIS — J432 Centrilobular emphysema: Secondary | ICD-10-CM

## 2022-10-25 DIAGNOSIS — E559 Vitamin D deficiency, unspecified: Secondary | ICD-10-CM

## 2022-10-25 DIAGNOSIS — R7303 Prediabetes: Secondary | ICD-10-CM

## 2022-10-25 MED ORDER — DULOXETINE HCL 60 MG PO CPEP: 60.0000 mg | ORAL_CAPSULE | Freq: Every day | ORAL | 3 refills | Status: DC

## 2022-10-25 MED ORDER — RISPERIDONE 1 MG PO TABS: ORAL_TABLET | ORAL | 3 refills | Status: DC

## 2022-10-25 MED ORDER — MYRBETRIQ 50 MG PO TB24: 50.0000 mg | ORAL_TABLET | Freq: Every day | ORAL | 1 refills | Status: DC

## 2022-10-25 MED ORDER — TRELEGY ELLIPTA 100-62.5-25 MCG/ACT IN AEPB: 1.0000 | INHALATION_SPRAY | Freq: Every day | RESPIRATORY_TRACT | 1 refills | Status: AC

## 2022-10-25 MED ORDER — TAMSULOSIN HCL 0.4 MG PO CAPS: 0.4000 mg | ORAL_CAPSULE | Freq: Every day | ORAL | 3 refills | Status: AC

## 2022-10-25 NOTE — Progress Notes (Signed)
Established Patient Office Visit  Subjective   Patient ID: Edward Cuevas, male    DOB: Sep 11, 1956  Age: 67 y.o. MRN: [id number]  Chief Complaint  Patient presents with   Follow-up    HPI Pt is a 67 yo male with COPD, Insomnia, OAB, pre-diabetes, GAD, MDD who presents to the clinic for medication refills.   Pt is having trouble affording trelegy and mybetriq. He has going days without either.   Otherwise he is doing pretty good when taking medications.   .. Active Ambulatory Problems    Diagnosis Date Noted   Mixed hyperlipidemia    Hypertension    Prediabetes    Vitamin D deficiency    DJD 10/23/2013   DDD (degenerative disc disease), lumbosacral 10/23/2013   Panic attacks 10/04/2014   History of hepatitis C 10/08/2014   Right inguinal hernia 10/08/2014   GAD (generalized anxiety disorder) 11/01/2014   Actinic keratosis 06/27/2015   BPH without obstruction/lower urinary tract symptoms 11/25/2015   Chronic pain of multiple joints 11/25/2015   Fatty liver 10/01/2016   Chronic obstructive pulmonary disease ([place]) 12/29/2016   Lumbar radiculopathy 01/18/2017   Right carotid bruit 04/09/2019   Right lower quadrant abdominal pain 04/09/2019   Current smoker 04/09/2019   Abdominal distension (gaseous) 04/11/2019   Adrenal adenoma 04/17/2019   Carotid stenosis, right 04/17/2019   Centrilobular emphysema ([place]) 05/16/2019   Moderate episode of recurrent major depressive disorder ([place]) 05/16/2019   Aortic atherosclerosis ([place]) 05/16/2019   Coronary artery disease due to calcified coronary lesion 05/16/2019   Pulmonary nodules 05/16/2019   Dyspnea on exertion 05/31/2019   OAB (overactive bladder) 07/12/2019   Hyperlipidemia    Hepatitis    Depression    COPD (chronic obstructive pulmonary disease) (HCC)    Cancer (HCC)    Arthritis    Anxiety    Chronic hepatitis C without hepatic coma ([place]) 03/09/2021   Positive colorectal cancer screening using Cologuard test 03/10/2021    Chronic left-sided low back pain with left-sided sciatica 03/22/2022   Pre-diabetes 10/26/2022   Insomnia 10/26/2022   Generalized anxiety disorder 10/26/2022   Resolved Ambulatory Problems    Diagnosis Date Noted   Encounter for long-term (current) use of other medications 04/08/2014   Olecranon bursitis 01/31/2015   Urinary frequency 12/29/2016   Routine physical examination 12/29/2016   Right ankle pain 02/21/2017   No Additional Past Medical History     Review of Systems  All other systems reviewed and are negative.     Objective:     BP (!) 156/76   Pulse 97   Ht '5\' 9"'$  (1.753 m)   Wt 184 lb (83.5 kg)   SpO2 96%   BMI 27.17 kg/m  BP Readings from Last 3 Encounters:  10/25/22 (!) 156/76  08/25/22 134/68  03/22/22 132/76   Wt Readings from Last 3 Encounters:  10/25/22 184 lb (83.5 kg)  08/25/22 180 lb 0.6 oz (81.7 kg)  03/22/22 182 lb (82.6 kg)    Results for orders placed or performed in visit on 67/34/19  Basic metabolic panel  Result Value Ref Range   Glucose 111 (H) 70 - 99 mg/dL   BUN 9 8 - 27 mg/dL   Creatinine, Ser 1.02 0.76 - 1.27 mg/dL   eGFR 81 >59 mL/min/1.73   BUN/Creatinine Ratio 9 (L) 10 - 24   Sodium 137 134 - 144 mmol/L   Potassium 4.7 3.5 - 5.2 mmol/L   Chloride 101 96 - 106 mmol/L  CO2 24 20 - 29 mmol/L   Calcium 9.4 8.6 - 10.2 mg/dL  CBC  Result Value Ref Range   WBC 6.5 3.4 - 10.8 x10E3/uL   RBC 5.84 (H) 4.14 - 5.80 x10E6/uL   Hemoglobin 18.0 (H) 13.0 - 17.7 g/dL   Hematocrit 52.3 (H) 37.5 - 51.0 %   MCV 90 79 - 97 fL   MCH 30.8 26.6 - 33.0 pg   MCHC 34.4 31.5 - 35.7 g/dL   RDW 12.7 11.6 - 15.4 %   Platelets 177 150 - 450 x10E3/uL  Hemoglobin A1c  Result Value Ref Range   Hgb A1c MFr Bld 5.9 (H) 4.8 - 5.6 %   Est. average glucose Bld gHb Est-mCnc 123 mg/dL  Hepatic function panel  Result Value Ref Range   Total Protein 6.9 6.0 - 8.5 g/dL   Albumin 4.5 3.9 - 4.9 g/dL   Bilirubin Total 0.4 0.0 - 1.2 mg/dL   Bilirubin,  Direct 0.16 0.00 - 0.40 mg/dL   Alkaline Phosphatase 40 (L) 44 - 121 IU/L   AST 21 0 - 40 IU/L   ALT 10 0 - 44 IU/L  Lipid panel  Result Value Ref Range   Cholesterol, Total 113 100 - 199 mg/dL   Triglycerides 67 0 - 149 mg/dL   HDL 39 (L) >39 mg/dL   VLDL Cholesterol Cal 14 5 - 40 mg/dL   LDL Chol Calc (NIH) 60 0 - 99 mg/dL   Chol/HDL Ratio 2.9 0.0 - 5.0 ratio  TSH  Result Value Ref Range   TSH 2.940 0.450 - 4.500 uIU/mL  VITAMIN D 25 Hydroxy (Vit-D Deficiency, Fractures)  Result Value Ref Range   Vit D, 25-Hydroxy 12.5 (L) 30.0 - 100.0 ng/mL   .[name] Lab Results  Component Value Date   HGBA1C 5.9 (H) 09/13/2022     Physical Exam Constitutional:      Appearance: Normal appearance.  Cardiovascular:     Rate and Rhythm: Normal rate and regular rhythm.  Pulmonary:     Effort: Pulmonary effort is normal.     Breath sounds: Normal breath sounds.  Musculoskeletal:     Right lower leg: No edema.     Left lower leg: No edema.  Neurological:     General: No focal deficit present.     Mental Status: He is alert and oriented to person, place, and time.  Psychiatric:        Mood and Affect: Mood normal.         Assessment & Plan:  .Marland KitchenJohn was seen today for follow-up.  Diagnoses and all orders for this visit:  OAB (overactive bladder) -     MYRBETRIQ 50 MG TB24 tablet; Take 1 tablet (50 mg total) by mouth daily. -     tamsulosin (FLOMAX) 0.4 MG CAPS capsule; Take 1 capsule (0.4 mg total) by mouth daily.  Generalized anxiety disorder -     DULoxetine (CYMBALTA) 60 MG capsule; Take 1 capsule (60 mg total) by mouth daily.  Moderate episode of recurrent major depressive disorder (HCC) -     DULoxetine (CYMBALTA) 60 MG capsule; Take 1 capsule (60 mg total) by mouth daily.  Insomnia, unspecified type -     risperiDONE (RISPERDAL) 1 MG tablet; TAKE ONE TABLET AT BEDTIME  Vitamin D deficiency -     VITAMIN D 25 Hydroxy (Vit-D Deficiency, Fractures)  Pre-diabetes -      Hemoglobin A1c  Essential hypertension  Mixed hyperlipidemia  Centrilobular emphysema (HCC) -  Fluticasone-Umeclidin-Vilant (TRELEGY ELLIPTA) 100-62.5-25 MCG/ACT AEPB; Inhale 1 puff into the lungs daily.   Referral to pharmacy for help affording Trelegy and Myrbetriq BP not to goal. Start checking at home 2 week bp check nurse visit Refilled medications   Return in about 3 months (around 01/24/2023).    Iran Planas, PA-C

## 2022-10-26 ENCOUNTER — Encounter: Payer: Self-pay | Admitting: Physician Assistant

## 2022-10-26 DIAGNOSIS — G4700 Insomnia, unspecified: Secondary | ICD-10-CM | POA: Insufficient documentation

## 2022-10-26 DIAGNOSIS — R7303 Prediabetes: Secondary | ICD-10-CM | POA: Insufficient documentation

## 2022-10-26 DIAGNOSIS — F411 Generalized anxiety disorder: Secondary | ICD-10-CM

## 2022-10-26 HISTORY — DX: Prediabetes: R73.03

## 2022-10-26 HISTORY — DX: Generalized anxiety disorder: F41.1

## 2022-10-26 HISTORY — DX: Insomnia, unspecified: G47.00

## 2022-11-05 ENCOUNTER — Encounter: Payer: Self-pay | Admitting: Physician Assistant

## 2022-11-05 NOTE — Patient Instructions (Signed)
Needs CT lung cancer screening

## 2022-11-26 ENCOUNTER — Other Ambulatory Visit: Payer: Medicare Other | Admitting: Pharmacist

## 2022-11-26 NOTE — Patient Instructions (Addendum)
Edward Cuevas,  It was great chatting with you today. As discussed, try to get an "Omron" brand of blood pressure monitor that goes around your upper arm - the wrist ones are pretty inaccurate. They are $30-35 at your pharmacy, or you can call your insurance and ask about OTC benefits.  Below is some helpful information about checking blood pressure:  Check your blood pressure once weekly, and any time you have concerning symptoms like headache, chest pain, dizziness, shortness of breath, or vision changes.   Our goal is less than 140/90.  To appropriately check your blood pressure, make sure you do the following:  1) Avoid caffeine, exercise, or tobacco products for 30 minutes before checking. Empty your bladder. 2) Sit with your back supported in a flat-backed chair. Rest your arm on something flat (arm of the chair, table, etc). 3) Sit still with your feet flat on the floor, resting, for at least 5 minutes.  4) Check your blood pressure. Take 1-2 readings.  5) Write down these readings and bring with you to any provider appointments.  Bring your home blood pressure machine with you to a provider's office for accuracy comparison at least once a year.   We will touch base in a few months to revisit the application for Extra Help program, which would be of significant benefit for you to have lower medication costs.  Take care, [name], PharmD Clinical Pharmacist [facility] [phone]

## 2022-11-26 NOTE — Progress Notes (Signed)
11/26/2022 Name: Edward Cuevas MRN: [id number] DOB: January 01, 1956   Edward Cuevas is a 66 y.o. year old male who presented for a telephone visit.   They were referred to the pharmacist by their PCP for assistance in managing hypertension and medication access.   Subjective:  Care Team: Primary Care Provider: Donella Stade, PA-C  Medication Access/Adherence  Current Pharmacy:  [facility], [place] - [address] [place] [address] Phone: [phone] Fax: [place] [address] Phone: [phone] Fax: [phone]   Patient reports affordability concerns with their medications: Yes , trelegy ellipta and mybetriq Patient reports access/transportation concerns to their pharmacy: No  Patient reports adherence concerns with their medications:  No     Hypertension:  Current medications: none at this time  Patient does not have a validated, automated, upper arm home BP cuff Current blood pressure readings readings: [id number] systolic, using a wrist cuff.  Patient denies hypotensive s/sx including dizziness, lightheadedness.  Patient denies hypertensive symptoms including headache, chest pain, shortness of breath   Medication Management:  Current adherence strategy: patient reports he is fine with medication costs at beginning of year, but when falls into donut hole,   Patient reports Fair adherence to medications  Patient reports the following barriers to adherence: cost   Objective:  Lab Results  Component Value Date   HGBA1C 5.9 (H) 09/13/2022    Lab Results  Component Value Date   CREATININE 1.02 09/13/2022   BUN 9 09/13/2022   NA 137 09/13/2022   K 4.7 09/13/2022   CL 101 09/13/2022   CO2 24 09/13/2022    Lab Results  Component Value Date   CHOL 113 09/13/2022   HDL 39 (L) 09/13/2022   LDLCALC 60  09/13/2022   TRIG 67 09/13/2022   CHOLHDL 2.9 09/13/2022    Medications Reviewed Today     Reviewed by Darius Bump, RPH (Pharmacist) on 11/26/22 at 1139  Med List Status: <None>   Medication Order Taking? Sig Documenting Provider Last Dose Status Informant  acetaminophen (TYLENOL) 650 MG CR tablet [id number] Yes Take 650 mg by mouth 2 (two) times daily. [provider] Taking Active   ALPRAZolam Duanne Moron) 0.5 MG tablet [id number] Yes TAKE 1 TABLET(0.5 MG) BY MOUTH TWICE DAILY Mozingo, Berdie Ogren, NP Taking Active   aspirin EC 81 MG tablet [id number] Yes Take 1 tablet (81 mg total) by mouth daily. Richardo Priest, MD Taking Active   cyclobenzaprine (FLEXERIL) 10 MG tablet [id number] Yes Take 1 tablet (10 mg total) by mouth 3 (three) times daily as needed for muscle spasms. Donella Stade, PA-C Taking Active   DULoxetine (CYMBALTA) 60 MG capsule [id number] Yes Take 1 capsule (60 mg total) by mouth daily. Donella Stade, PA-C Taking Active   Fluticasone-Umeclidin-Vilant (TRELEGY ELLIPTA) 100-62.5-25 MCG/ACT AEPB [id number] Yes Inhale 1 puff into the lungs daily. Donella Stade, PA-C Taking Active   hydrocortisone (ANUSOL-HC) 2.5 % rectal cream [id number] Yes APPLY RECTALLY TO THE AFFECTED AREA TWICE DAILY Breeback, Jade L, PA-C Taking Active   imiquimod (ALDARA) 5 % cream [id number] Yes Apply 1 Application topically at bedtime. [provider] Taking Active   MYRBETRIQ 50 MG TB24 tablet [id number] Yes Take 1 tablet (50 mg total) by mouth daily. Donella Stade, PA-C Taking Active   nitroGLYCERIN (NITROSTAT) 0.4 MG SL tablet [id number]  Yes Place 0.4 mg under the tongue every 5 (five) minutes as needed for chest pain. [provider] Taking Active   risperiDONE (RISPERDAL) 1 MG tablet [id number] Yes TAKE ONE TABLET AT BEDTIME Breeback, Jade L, PA-C Taking Active   rosuvastatin (CRESTOR) 40 MG tablet [id number] Yes Take 1 tablet (40 mg total) by mouth daily. Revankar,  Reita Cliche, MD Taking Active   tamsulosin [facility]) 0.4 MG CAPS capsule [id number] Yes Take 1 capsule (0.4 mg total) by mouth daily. Donella Stade, PA-C Taking Active              Assessment/Plan:   Hypertension: - Currently unknown control - Reviewed appropriate blood pressure monitoring technique and reviewed goal blood pressure. Recommended to check home blood pressure and heart rate with a validated upper arm BP cuff and document numbers for future review. Provider recommended follow up nurse visit for BP check, but patient did not schedule. - Recommend to obtain validated BP cuff and check at home for further information about BP control.    Medication Management: - Currently strategy insufficient to maintain appropriate adherence to prescribed medication regimen - Reviewed medicare LIS Extra Help program and initial income screening based on our discussion, patient would be eligible. This would avoid his erratic use of medications/avoidance during donut hole. All generic medications would be reduced to $4.50 copay, and brand medications of $11.90 copay. He currently pays $45-60 per piece for mybetriq and trelegy, and during donut hole, well over $100 each. Patient elects to defer completing the application at this time.    Follow Up Plan: patient requests to defer medication access application until later. Will complete follow up appointment in 1-2 months   Larinda Buttery, PharmD Clinical Pharmacist [facility] [phone]

## 2022-12-15 ENCOUNTER — Encounter: Payer: Self-pay | Admitting: Adult Health

## 2022-12-15 ENCOUNTER — Ambulatory Visit: Payer: Medicare Other | Admitting: Adult Health

## 2022-12-15 DIAGNOSIS — G4700 Insomnia, unspecified: Secondary | ICD-10-CM

## 2022-12-15 DIAGNOSIS — F331 Major depressive disorder, recurrent, moderate: Secondary | ICD-10-CM | POA: Diagnosis not present

## 2022-12-15 DIAGNOSIS — F411 Generalized anxiety disorder: Secondary | ICD-10-CM

## 2022-12-15 DIAGNOSIS — F410 Panic disorder [episodic paroxysmal anxiety] without agoraphobia: Secondary | ICD-10-CM | POA: Diagnosis not present

## 2022-12-15 MED ORDER — ALPRAZOLAM 0.5 MG PO TABS: ORAL_TABLET | ORAL | 2 refills | Status: DC

## 2022-12-15 NOTE — Progress Notes (Signed)
Brazen Marsili [id number] August 10, 1956 67 y.o.  Subjective:   Patient ID:  Edward Cuevas is a 67 y.o. (DOB [birth year]) male.  Chief Complaint: No chief complaint on file.   HPI Dennies Sikkema presents to the office today for follow-up of MDD, anxiety, panic attacks, and insomnia.  Describes mood today as "ok". Pleasant. Mood symptoms - reports depression, anxiety, and irritability throughout the day - "mood issues in the afternoon". Reports increased panic attacks - "more so in the afternoon". Reports increased worry, rumination, and over thinking. Mood is mostly consistent. Stating "I really struggle in the afternoons". Continues to grieve loss of his dog - "I miss having someone to talk too". Feels like current medications are helpful - would like to add an extra Xanax to the afternoon to help manage the increased anxiety.  Stable interest and motivation. Taking medications as prescribed.  Energy levels stable. Active, does not have a regular exercise routine with physical disabilities.  Enjoys some usual interests and activities. Divorced. Lives with ex wife. Has a 98 year old son locally and another son in [place]. Has a sister in [place]. Spending time with family. Appetite adequate. Weight stable - 180 pounds. Sleeps well most nights. Averages 9 hours. Focus and concentration stable. Completing tasks. Managing aspects of household. Receiving disability benefits/retirement. Retired Engineer, mining.  Denies SI or HI.  Denies AH or VH. Denies self harm. Denies substance use.  Previous medication trials: Cymbalta, Wellbutrin    AUDIT    Flowsheet Row Office Visit from 03/24/2021 in [place] at [facility]  Alcohol Use Disorder Identification Test Final Score (AUDIT) 9      GAD-7    Flowsheet Row Office Visit from 07/14/2020 in [place] at [place] Visit from 01/09/2020 in [place] at [place] Visit from 11/28/2019 in [place] at [place] Visit from 07/11/2019 in [place] at [place] Visit from 05/16/2019 in [place] at [facility]  Total GAD-7 Score 0 6 3 0 1      [place] Office Visit from 10/25/2022 in [place] at [place] Visit from 10/13/2021 in [facility] for Infectious Disease Office Visit from 06/29/2021 in [facility] for Infectious Disease Office Visit from 03/24/2021 in [place] at [place] Visit from 07/14/2020 in [place] at [facility] Total Score 2 0 0 0 0  PHQ-9 Total Score 4 -- -- 0 4        Review of Systems:  Review of Systems  Musculoskeletal:  Negative for gait problem.  Neurological:  Negative for tremors.  Psychiatric/Behavioral:         Please refer to HPI    Medications: I have reviewed the patient's current medications.  Current Outpatient Medications  Medication Sig Dispense Refill   acetaminophen (TYLENOL) 650 MG CR tablet Take 650 mg by mouth 2 (two) times daily.     ALPRAZolam (XANAX) 0.5 MG tablet TAKE 1 TABLET(0.5 MG) BY MOUTH TWICE DAILY 60 tablet 2   aspirin EC 81 MG tablet Take 1 tablet (81 mg total) by mouth daily. 90 tablet 3   cyclobenzaprine (FLEXERIL) 10 MG tablet  Take 1 tablet (10 mg total) by mouth 3 (three) times daily as needed for muscle spasms. 30 tablet 0   DULoxetine (CYMBALTA) 60 MG capsule Take 1 capsule (60 mg total) by mouth daily. 90 capsule 3   Fluticasone-Umeclidin-Vilant (TRELEGY ELLIPTA) 100-62.5-25 MCG/ACT AEPB Inhale 1 puff into the lungs daily. 180 each 1   hydrocortisone (ANUSOL-HC) 2.5 % rectal cream APPLY RECTALLY  TO THE AFFECTED AREA TWICE DAILY 60 g 1   imiquimod (ALDARA) 5 % cream Apply 1 Application topically at bedtime.     MYRBETRIQ 50 MG TB24 tablet Take 1 tablet (50 mg total) by mouth daily. 90 tablet 1   nitroGLYCERIN (NITROSTAT) 0.4 MG SL tablet Place 0.4 mg under the tongue every 5 (five) minutes as needed for chest pain.     risperiDONE (RISPERDAL) 1 MG tablet TAKE ONE TABLET AT BEDTIME 90 tablet 3   rosuvastatin (CRESTOR) 40 MG tablet Take 1 tablet (40 mg total) by mouth daily. 90 tablet 3   tamsulosin (FLOMAX) 0.4 MG CAPS capsule Take 1 capsule (0.4 mg total) by mouth daily. 90 capsule 3   No current facility-administered medications for this visit.    Medication Side Effects: None  Allergies:  Allergies  Allergen Reactions   Abilify [Aripiprazole]     Feel weird.    Penicillins Itching and Rash    Has patient had a PCN reaction causing immediate rash, facial/tongue/throat swelling, SOB or lightheadedness with hypotension: Yes Has patient had a PCN reaction causing severe rash involving mucus membranes or skin necrosis: No Has patient had a PCN reaction that required hospitalization No Has patient had a PCN reaction occurring within the last 10 years: No If all of the above answers are "NO", then may proceed with Cephalosporin use.     Past Medical History:  Diagnosis Date   Abdominal distension (gaseous) 04/11/2019   Actinic keratosis 06/27/2015   Adrenal adenoma 04/17/2019   Bilateral CT abd 03/2019    Anxiety    Aortic atherosclerosis ([place]) 05/16/2019   Arthritis    BPH without obstruction/lower urinary tract symptoms 11/25/2015   Cancer ([place])    skin   Carotid stenosis, right 04/17/2019   50-69 percent narrowed. Doppler 03/2019.    Centrilobular emphysema ([place]) 05/16/2019   Chronic hepatitis C without hepatic coma ([place]) 03/09/2021   Chronic left-sided low back pain with left-sided sciatica 03/22/2022   Chronic obstructive pulmonary disease ([place]) 12/29/2016   Chronic pain of  multiple joints 11/25/2015   COPD (chronic obstructive pulmonary disease) ([place])    per pt ?   Coronary artery disease due to calcified coronary lesion 05/16/2019   Current smoker 04/09/2019   DDD (degenerative disc disease), lumbosacral 10/23/2013   opiod risk 1, low risk.  Failed UDS 05/2017 positive for marijuanna/alcohol.    Depression    DJD 10/23/2013   Dyspnea on exertion 05/31/2019   Fatty liver 10/01/2016   Via U/S 09/2016.    GAD (generalized anxiety disorder) 11/01/2014   Hepatitis    A,B,C   History of hepatitis C 10/08/2014   Per pt cured.   Hyperlipidemia    Hypertension    Lumbar radiculopathy 01/18/2017   Mixed hyperlipidemia    Moderate episode of recurrent major depressive disorder ([place]) 05/16/2019   OAB (overactive bladder) 07/12/2019   Panic attacks 10/04/2014   Positive colorectal cancer screening using Cologuard test 03/10/2021   Prediabetes    Pulmonary nodules 05/16/2019   Right carotid bruit 04/09/2019   Right inguinal hernia 10/08/2014  Right lower quadrant abdominal pain 04/09/2019   Vitamin D deficiency     Past Medical History, Surgical history, Social history, and Family history were reviewed and updated as appropriate.   Please see review of systems for further details on the patient's review from today.   Objective:   Physical Exam:  There were no vitals taken for this visit.  Physical Exam Constitutional:      General: He is not in acute distress. Musculoskeletal:        General: No deformity.  Neurological:     Mental Status: He is alert and oriented to person, place, and time.     Coordination: Coordination normal.  Psychiatric:        Attention and Perception: Attention and perception normal. He does not perceive auditory or visual hallucinations.        Mood and Affect: Mood normal. Mood is not anxious or depressed. Affect is not labile, blunt, angry or inappropriate.        Speech: Speech normal.        Behavior: Behavior normal.         Thought Content: Thought content normal. Thought content is not paranoid or delusional. Thought content does not include homicidal or suicidal ideation. Thought content does not include homicidal or suicidal plan.        Cognition and Memory: Cognition and memory normal.        Judgment: Judgment normal.     Comments: Insight intact     Lab Review:     Component Value Date/Time   NA 137 09/13/2022 1041   K 4.7 09/13/2022 1041   CL 101 09/13/2022 1041   CO2 24 09/13/2022 1041   GLUCOSE 111 (H) 09/13/2022 1041   GLUCOSE 103 (H) 04/27/2021 1355   BUN 9 09/13/2022 1041   CREATININE 1.02 09/13/2022 1041   CREATININE 0.90 04/27/2021 1355   CALCIUM 9.4 09/13/2022 1041   PROT 6.9 09/13/2022 1041   ALBUMIN 4.5 09/13/2022 1041   AST 21 09/13/2022 1041   ALT 10 09/13/2022 1041   ALT 77 (H) 03/09/2021 1421   ALKPHOS 40 (L) 09/13/2022 1041   BILITOT 0.4 09/13/2022 1041   GFRNONAA 83 02/25/2021 1340   GFRAA 96 02/25/2021 1340       Component Value Date/Time   WBC 6.5 09/13/2022 1041   WBC 4.3 03/09/2021 1421   RBC 5.84 (H) 09/13/2022 1041   RBC 5.79 03/09/2021 1421   HGB 18.0 (H) 09/13/2022 1041   HCT 52.3 (H) 09/13/2022 1041   PLT 177 09/13/2022 1041   MCV 90 09/13/2022 1041   MCH 30.8 09/13/2022 1041   MCH 32.3 03/09/2021 1421   MCHC 34.4 09/13/2022 1041   MCHC 34.6 03/09/2021 1421   RDW 12.7 09/13/2022 1041   LYMPHSABS 2,627 04/10/2019 0811   MONOABS 0.4 09/19/2014 1716   EOSABS 81 04/10/2019 0811   BASOSABS 58 04/10/2019 0811    No results found for: "POCLITH", "LITHIUM"   No results found for: "PHENYTOIN", "PHENOBARB", "VALPROATE", "CBMZ"   .res Assessment: Plan:    Plan:  PDMP reviewed  1. Cymbalta '60mg'$  daily   2. Risperdal '1mg'$  at hs  3. Increase Xanax 0.'5mg'$  BID to TID - increased anxiety and panic attacks  RTC 6 months  Patient advised to contact office with any questions, adverse effects, or acute worsening in signs and symptoms.  Discussed potential  metabolic side effects associated with atypical antipsychotics, as well as potential risk for movement side effects. Advised pt  to contact office if movement side effects occur.   There are no diagnoses linked to this encounter.   Please see After Visit Summary for patient specific instructions.  Future Appointments  Date Time Provider [place]  12/15/2022 12:00 PM Kaliq Lege, Berdie Ogren, NP CP-CP None  02/17/2023  1:30 PM Darius Bump, [facility] None    No orders of the defined types were placed in this encounter.   -------------------------------

## 2023-01-10 ENCOUNTER — Telehealth: Payer: Self-pay | Admitting: Physician Assistant

## 2023-01-10 NOTE — Telephone Encounter (Signed)
Called patient to schedule Medicare Annual Wellness Visit (AWV). Left message for patient to call back and schedule Medicare Annual Wellness Visit (AWV).  Last date of AWV: 05/15/2019  Please schedule an appointment at any time with [facility].  If any questions, please contact me at [phone].  Thank you ,  Lin Givens Patient Access Advocate II Direct Dial: [phone]

## 2023-02-11 ENCOUNTER — Telehealth: Payer: Self-pay | Admitting: Pharmacist

## 2023-02-11 NOTE — Progress Notes (Signed)
Care Coordination Call  Edward Cuevas to reschedule upcoming phone appt due to provider conflict, or to offer to speak today if available. Patient did not answer, left voicemail with tentative rescheduled day/time and to contact if this did not work for him.  Lynnda Shields, PharmD, BCPS Clinical Pharmacist [facility]

## 2023-02-15 ENCOUNTER — Telehealth: Payer: Self-pay | Admitting: Pharmacist

## 2023-02-15 ENCOUNTER — Other Ambulatory Visit: Payer: Medicare Other | Admitting: Pharmacist

## 2023-02-15 NOTE — Progress Notes (Signed)
Attempted to contact patient x2 for scheduled appointment for medication management. Left HIPAA compliant message for patient to return my call at their convenience. Will also send mychart.  Lynnda Shields, PharmD, BCPS Clinical Pharmacist [facility]

## 2023-02-17 ENCOUNTER — Telehealth: Payer: Medicare Other

## 2023-02-17 ENCOUNTER — Other Ambulatory Visit: Payer: Medicare Other | Admitting: Pharmacist

## 2023-03-13 ENCOUNTER — Other Ambulatory Visit: Payer: Self-pay | Admitting: Adult Health

## 2023-03-13 DIAGNOSIS — F410 Panic disorder [episodic paroxysmal anxiety] without agoraphobia: Secondary | ICD-10-CM

## 2023-03-24 ENCOUNTER — Telehealth: Payer: Self-pay

## 2023-03-24 NOTE — Telephone Encounter (Signed)
Returned call to patient to schedule annual LDCT.  Phone rang several times then disconnected.  Unable to leave a message

## 2023-03-25 ENCOUNTER — Other Ambulatory Visit: Payer: Self-pay | Admitting: *Deleted

## 2023-03-25 DIAGNOSIS — Z87891 Personal history of nicotine dependence: Secondary | ICD-10-CM

## 2023-03-25 DIAGNOSIS — F17210 Nicotine dependence, cigarettes, uncomplicated: Secondary | ICD-10-CM

## 2023-03-25 DIAGNOSIS — Z122 Encounter for screening for malignant neoplasm of respiratory organs: Secondary | ICD-10-CM

## 2023-03-27 ENCOUNTER — Other Ambulatory Visit: Payer: Self-pay | Admitting: Physician Assistant

## 2023-03-27 DIAGNOSIS — N3281 Overactive bladder: Secondary | ICD-10-CM

## 2023-04-25 ENCOUNTER — Ambulatory Visit
Admission: RE | Admit: 2023-04-25 | Discharge: 2023-04-25 | Disposition: A | Discharge status: Home / Self Care | Payer: Medicare Other | Source: Ambulatory Visit | Attending: Acute Care | Admitting: Acute Care

## 2023-04-25 DIAGNOSIS — Z122 Encounter for screening for malignant neoplasm of respiratory organs: Secondary | ICD-10-CM

## 2023-04-25 DIAGNOSIS — Z87891 Personal history of nicotine dependence: Secondary | ICD-10-CM

## 2023-04-25 DIAGNOSIS — F17210 Nicotine dependence, cigarettes, uncomplicated: Secondary | ICD-10-CM | POA: Diagnosis not present

## 2023-04-28 DIAGNOSIS — R3914 Feeling of incomplete bladder emptying: Secondary | ICD-10-CM | POA: Diagnosis not present

## 2023-05-02 ENCOUNTER — Other Ambulatory Visit: Payer: Self-pay

## 2023-05-02 ENCOUNTER — Telehealth: Payer: Self-pay | Admitting: Acute Care

## 2023-05-02 DIAGNOSIS — Z87891 Personal history of nicotine dependence: Secondary | ICD-10-CM

## 2023-05-02 DIAGNOSIS — F17210 Nicotine dependence, cigarettes, uncomplicated: Secondary | ICD-10-CM

## 2023-05-02 DIAGNOSIS — Z122 Encounter for screening for malignant neoplasm of respiratory organs: Secondary | ICD-10-CM

## 2023-05-02 NOTE — Telephone Encounter (Signed)
Spoke with patient by phone, using two patient identifiers.  Results of LDCT reviewed.  Rads2 findings with small lung nodules but nothing suspicious for lung cancer at this time.  Emphysema and adenomas, as previously noted.  Atherosclerosis again seen with new notation of aortic and mitral valve calcifications. Results routed to cardiology and PCP for review and further recommendations, if indicated (such as echocardiogram).  Patient acknowledged understanding and will follow up with providers, if he has not heard from anyone.  He had no questions.  New order placed for annual 2025 LDCT.

## 2023-05-28 ENCOUNTER — Other Ambulatory Visit: Payer: Self-pay | Admitting: Cardiology

## 2023-06-15 ENCOUNTER — Ambulatory Visit: Payer: Medicare Other | Admitting: Adult Health

## 2023-06-15 ENCOUNTER — Encounter: Payer: Self-pay | Admitting: Adult Health

## 2023-06-15 DIAGNOSIS — G4700 Insomnia, unspecified: Secondary | ICD-10-CM | POA: Diagnosis not present

## 2023-06-15 DIAGNOSIS — F331 Major depressive disorder, recurrent, moderate: Secondary | ICD-10-CM

## 2023-06-15 DIAGNOSIS — F411 Generalized anxiety disorder: Secondary | ICD-10-CM

## 2023-06-15 DIAGNOSIS — F410 Panic disorder [episodic paroxysmal anxiety] without agoraphobia: Secondary | ICD-10-CM | POA: Diagnosis not present

## 2023-06-15 MED ORDER — ALPRAZOLAM 0.5 MG PO TABS: ORAL_TABLET | ORAL | 2 refills | Status: DC

## 2023-06-15 NOTE — Progress Notes (Signed)
Edward Cuevas [id number] 10/07/1956 67 y.o.  Subjective:   Patient ID:  Edward Cuevas is a 67 y.o. (DOB 1956-05-10) male.  Chief Complaint: No chief complaint on file.   HPI Edward Cuevas presents to the office today for follow-up of MDD, anxiety, panic attacks, and insomnia.  Describes mood today as "ok". Pleasant. Mood symptoms - reports depression, anxiety, and irritability. Reports deceased panic attacks - "on the verge sometimes". Reports worry, rumination, and over thinking. Mood is "pretty" stable. Stating "I feel like I'm doing alright". Feels like current medications are helpful. Stable interest and motivation. Taking medications as prescribed.  Energy levels low. Active, does not have a regular exercise routine with physical disabilities.  Enjoys some usual interests and activities. Divorced. Lives with ex wife. Has a 56 year old son locally and another son in [place]. Has a sister in [place]. Spending time with family. Appetite adequate. Weight stable - 180 pounds. Sleeps well most nights. Averages 8 hours. Reports 45 minutes to an hour nap some days. Focus and concentration stable. Completing tasks. Managing aspects of household. Receiving disability benefits/retirement. Retired Animal nutritionist.  Denies SI or HI.  Denies AH or VH. Denies self harm. Denies substance use.  Previous medication trials: Cymbalta, Wellbutrin   AUDIT    Flowsheet Row Office Visit from 03/24/2021 in [facility] Primary Care & [facility] Medicine at [facility]  Alcohol Use Disorder Identification Test Final Score (AUDIT) 9      GAD-7    Flowsheet Row Office Visit from 07/14/2020 in [facility] Primary Care & Sports Medicine at [facility] Office Visit from 01/09/2020 in [facility] Primary Care & Sports Medicine at [facility] Office Visit from 11/28/2019 in [facility] Primary Care & Sports Medicine at [facility] Office Visit from 07/11/2019 in [facility] Primary Care  & Sports Medicine at [facility] Office Visit from 05/16/2019 in [facility] Primary Care & Sports Medicine at [facility]  Total GAD-7 Score 0 6 3 0 1      PHQ2-9    Flowsheet Row Office Visit from 10/25/2022 in [facility] Primary Care & Sports Medicine at [facility] Office Visit from 10/13/2021 in [facility]ious [facility] Office Visit from 06/29/2021 in [facility] for Infectious Disease Office Visit from 03/24/2021 in [facility] Primary Care & Sports Medicine at [facility] Office Visit from 07/14/2020 in [facility] Primary Care & Sports Medicine at [facility] Total Score 2 0 0 0 0  PHQ-9 Total Score 4 -- -- 0 4        Review of Systems:  Review of Systems  Musculoskeletal:  Negative for gait problem.  Neurological:  Negative for tremors.  Psychiatric/Behavioral:         Please refer to HPI    Medications: I have reviewed the patient's current medications.  Current Outpatient Medications  Medication Sig Dispense Refill   acetaminophen (TYLENOL) 650 MG CR tablet Take 650 mg by mouth 2 (two) times daily.     ALPRAZolam (XANAX) 0.5 MG tablet TAKE 1 TABLET(0.5 MG) BY MOUTH THREE TIMES DAILY 90 tablet 2   aspirin EC 81 MG tablet Take 1 tablet (81 mg total) by mouth daily. 90 tablet 3   cyclobenzaprine (FLEXERIL) 10 MG tablet Take 1 tablet (10 mg total) by mouth 3 (three) times daily as needed for muscle spasms. 30 tablet 0   DULoxetine (CYMBALTA) 60 MG capsule Take 1 capsule (60 mg total) by mouth  daily. 90 capsule 3   Fluticasone-Umeclidin-Vilant (TRELEGY ELLIPTA) 100-62.5-25 MCG/ACT AEPB Inhale 1 puff into the lungs daily. 180 each 1   hydrocortisone (ANUSOL-HC) 2.5 % rectal cream APPLY RECTALLY TO THE AFFECTED AREA TWICE DAILY 60 g 1   imiquimod (ALDARA) 5 % cream Apply 1 Application topically at bedtime.     MYRBETRIQ 50 MG TB24 tablet TAKE 1 TABLET(50 MG) BY MOUTH DAILY 90 tablet  1   nitroGLYCERIN (NITROSTAT) 0.4 MG SL tablet Place 0.4 mg under the tongue every 5 (five) minutes as needed for chest pain.     risperiDONE (RISPERDAL) 1 MG tablet TAKE ONE TABLET AT BEDTIME 90 tablet 3   rosuvastatin (CRESTOR) 40 MG tablet Take 1 tablet (40 mg total) by mouth daily. 90 tablet 0   tamsulosin (FLOMAX) 0.4 MG CAPS capsule Take 1 capsule (0.4 mg total) by mouth daily. 90 capsule 3   No current facility-administered medications for this visit.    Medication Side Effects: None  Allergies:  Allergies  Allergen Reactions   Abilify [Aripiprazole]     Feel weird.    Penicillins Itching and Rash    Has patient had a PCN reaction causing immediate rash, facial/tongue/throat swelling, SOB or lightheadedness with hypotension: Yes Has patient had a PCN reaction causing severe rash involving mucus membranes or skin necrosis: No Has patient had a PCN reaction that required hospitalization No Has patient had a PCN reaction occurring within the last 10 years: No If all of the above answers are "NO", then may proceed with Cephalosporin use.     Past Medical History:  Diagnosis Date   Abdominal distension (gaseous) 04/11/2019   Actinic keratosis 06/27/2015   Adrenal adenoma 04/17/2019   Bilateral CT abd 03/2019    Anxiety    Aortic atherosclerosis (HCC) 05/16/2019   Arthritis    BPH without obstruction/lower urinary tract symptoms 11/25/2015   Cancer (HCC)    skin   Carotid stenosis, right 04/17/2019   50-69 percent narrowed. Doppler 03/2019.    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   Coronary artery disease due to calcified coronary lesion 05/16/2019   Current smoker 04/09/2019   DDD  (degenerative disc disease), lumbosacral 10/23/2013   opiod risk 1, low risk.  Failed UDS 05/2017 positive for marijuanna/alcohol.    Depression    DJD 10/23/2013   Dyspnea on exertion 05/31/2019   Fatty liver 10/01/2016   Via U/S 09/2016.    GAD (generalized anxiety disorder) 11/01/2014   Hepatitis    A,B,C   History of hepatitis C 10/08/2014   Per pt cured.   Hyperlipidemia    Hypertension    Lumbar radiculopathy 01/18/2017   Mixed hyperlipidemia    Moderate episode of recurrent major depressive disorder (HCC) 05/16/2019   OAB (overactive bladder) 07/12/2019   Panic attacks 10/04/2014   Positive colorectal cancer screening using Cologuard test 03/10/2021   Prediabetes    Pulmonary nodules 05/16/2019   Right carotid bruit 04/09/2019   Right inguinal hernia 10/08/2014   Right lower quadrant abdominal pain 04/09/2019   Vitamin D deficiency     Past Medical History, Surgical history, Social history, and Family history were reviewed and updated as appropriate.   Please see  review of systems for further details on the patient's review from today.   Objective:   Physical Exam:  There were no vitals taken for this visit.  Physical Exam Constitutional:      General: He is not in acute distress. Musculoskeletal:        General: No deformity.  Neurological:     Mental Status: He is alert and oriented to person, place, and time.     Coordination: Coordination normal.  Psychiatric:        Attention and Perception: Attention and perception normal. He does not perceive auditory or visual hallucinations.        Mood and Affect: Mood normal. Mood is not anxious or depressed. Affect is not labile, blunt, angry or inappropriate.        Speech: Speech normal.        Behavior: Behavior normal.        Thought Content: Thought content normal. Thought content is not paranoid or delusional. Thought content does not include homicidal or suicidal ideation. Thought content does not include homicidal or  suicidal plan.        Cognition and Memory: Cognition and memory normal.        Judgment: Judgment normal.     Comments: Insight intact     Lab Review:     Component Value Date/Time   NA 137 09/13/2022 1041   K 4.7 09/13/2022 1041   CL 101 09/13/2022 1041   CO2 24 09/13/2022 1041   GLUCOSE 111 (H) 09/13/2022 1041   GLUCOSE 103 (H) 04/27/2021 1355   BUN 9 09/13/2022 1041   CREATININE 1.02 09/13/2022 1041   CREATININE 0.90 04/27/2021 1355   CALCIUM 9.4 09/13/2022 1041   PROT 6.9 09/13/2022 1041   ALBUMIN 4.5 09/13/2022 1041   AST 21 09/13/2022 1041   ALT 10 09/13/2022 1041   ALT 77 (H) 03/09/2021 1421   ALKPHOS 40 (L) 09/13/2022 1041   BILITOT 0.4 09/13/2022 1041   GFRNONAA 83 02/25/2021 1340   GFRAA 96 02/25/2021 1340       Component Value Date/Time   WBC 6.5 09/13/2022 1041   WBC 4.3 03/09/2021 1421   RBC 5.84 (H) 09/13/2022 1041   RBC 5.79 03/09/2021 1421   HGB 18.0 (H) 09/13/2022 1041   HCT 52.3 (H) 09/13/2022 1041   PLT 177 09/13/2022 1041   MCV 90 09/13/2022 1041   MCH 30.8 09/13/2022 1041   MCH 32.3 03/09/2021 1421   MCHC 34.4 09/13/2022 1041   MCHC 34.6 03/09/2021 1421   RDW 12.7 09/13/2022 1041   LYMPHSABS 2,627 04/10/2019 0811   MONOABS 0.4 09/19/2014 1716   EOSABS 81 04/10/2019 0811   BASOSABS 58 04/10/2019 0811    No results found for: "POCLITH", "LITHIUM"   No results found for: "PHENYTOIN", "PHENOBARB", "VALPROATE", "CBMZ"   .res Assessment: Plan:    Plan:  PDMP reviewed  1. Cymbalta 60mg  daily   2. Risperdal 1mg  at hs  3. Xanax 0.5mg  TID - anxiety and panic attacks  RTC 6 months  Patient advised to contact office with any questions, adverse effects, or acute worsening in signs and symptoms.  Discussed potential metabolic side effects associated with atypical antipsychotics, as well as potential risk for movement side effects. Advised pt to contact office if movement side effects occur.   Discussed potential benefits, risk, and side  effects of benzodiazepines to include potential risk of tolerance and dependence, as well as possible drowsiness.  Advised patient not to drive if  experiencing drowsiness and to take lowest possible effective dose to minimize risk of dependence and tolerance.   Diagnoses and all orders for this visit:  Panic attacks -     ALPRAZolam (XANAX) 0.5 MG tablet; TAKE 1 TABLET(0.5 MG) BY MOUTH THREE TIMES DAILY     Please see After Visit Summary for patient specific instructions.  Future Appointments  Date Time Provider Department Center  07/07/2023 11:20 AM Revankar, Aundra Dubin, MD [facility] None    No orders of the defined types were placed in this encounter.   -------------------------------

## 2023-06-26 ENCOUNTER — Emergency Department (HOSPITAL_BASED_OUTPATIENT_CLINIC_OR_DEPARTMENT_OTHER)
Admission: EM | Admit: 2023-06-26 | Discharge: 2023-06-26 | Disposition: A | Discharge status: Home / Self Care | Payer: Medicare Other | Attending: Emergency Medicine | Admitting: Emergency Medicine

## 2023-06-26 ENCOUNTER — Emergency Department (HOSPITAL_BASED_OUTPATIENT_CLINIC_OR_DEPARTMENT_OTHER): Payer: Medicare Other | Admitting: Radiology

## 2023-06-26 ENCOUNTER — Encounter (HOSPITAL_BASED_OUTPATIENT_CLINIC_OR_DEPARTMENT_OTHER): Payer: Self-pay

## 2023-06-26 ENCOUNTER — Other Ambulatory Visit: Payer: Self-pay

## 2023-06-26 DIAGNOSIS — R0602 Shortness of breath: Secondary | ICD-10-CM | POA: Diagnosis not present

## 2023-06-26 DIAGNOSIS — I10 Essential (primary) hypertension: Secondary | ICD-10-CM | POA: Insufficient documentation

## 2023-06-26 DIAGNOSIS — Z7982 Long term (current) use of aspirin: Secondary | ICD-10-CM | POA: Diagnosis not present

## 2023-06-26 DIAGNOSIS — R258 Other abnormal involuntary movements: Secondary | ICD-10-CM | POA: Diagnosis present

## 2023-06-26 DIAGNOSIS — J449 Chronic obstructive pulmonary disease, unspecified: Secondary | ICD-10-CM | POA: Diagnosis not present

## 2023-06-26 DIAGNOSIS — R251 Tremor, unspecified: Secondary | ICD-10-CM | POA: Diagnosis not present

## 2023-06-26 DIAGNOSIS — F419 Anxiety disorder, unspecified: Secondary | ICD-10-CM | POA: Diagnosis not present

## 2023-06-26 LAB — CBC WITH DIFFERENTIAL/PLATELET
Abs Immature Granulocytes: 0.02 10*3/uL (ref 0.00–0.07)
Basophils Absolute: 0 10*3/uL (ref 0.0–0.1)
Basophils Relative: 1 %
Eosinophils Absolute: 0 10*3/uL (ref 0.0–0.5)
Eosinophils Relative: 1 %
HCT: 55.1 % — ABNORMAL HIGH (ref 39.0–52.0)
Hemoglobin: 19.1 g/dL — ABNORMAL HIGH (ref 13.0–17.0)
Immature Granulocytes: 0 %
Lymphocytes Relative: 38 %
Lymphs Abs: 2.2 10*3/uL (ref 0.7–4.0)
MCH: 31.3 pg (ref 26.0–34.0)
MCHC: 34.7 g/dL (ref 30.0–36.0)
MCV: 90.2 fL (ref 80.0–100.0)
Monocytes Absolute: 0.5 10*3/uL (ref 0.1–1.0)
Monocytes Relative: 8 %
Neutro Abs: 3 10*3/uL (ref 1.7–7.7)
Neutrophils Relative %: 52 %
Platelets: 152 10*3/uL (ref 150–400)
RBC: 6.11 MIL/uL — ABNORMAL HIGH (ref 4.22–5.81)
RDW: 12.8 % (ref 11.5–15.5)
WBC: 5.8 10*3/uL (ref 4.0–10.5)
nRBC: 0 % (ref 0.0–0.2)

## 2023-06-26 LAB — COMPREHENSIVE METABOLIC PANEL
ALT: 14 U/L (ref 0–44)
AST: 21 U/L (ref 15–41)
Albumin: 4.5 g/dL (ref 3.5–5.0)
Alkaline Phosphatase: 36 U/L — ABNORMAL LOW (ref 38–126)
Anion gap: 7 (ref 5–15)
BUN: 8 mg/dL (ref 8–23)
CO2: 31 mmol/L (ref 22–32)
Calcium: 9.2 mg/dL (ref 8.9–10.3)
Chloride: 98 mmol/L (ref 98–111)
Creatinine, Ser: 0.89 mg/dL (ref 0.61–1.24)
GFR, Estimated: >60 mL/min (ref >60)
Glucose, Bld: 103 mg/dL — ABNORMAL HIGH (ref 70–99)
Potassium: 4.5 mmol/L (ref 3.5–5.1)
Sodium: 136 mmol/L (ref 135–145)
Total Bilirubin: 0.5 mg/dL (ref 0.3–1.2)
Total Protein: 7 g/dL (ref 6.5–8.1)

## 2023-06-26 LAB — URINALYSIS, ROUTINE W REFLEX MICROSCOPIC
Bilirubin Urine: NEGATIVE
Glucose, UA: NEGATIVE mg/dL
Hgb urine dipstick: NEGATIVE
Ketones, ur: NEGATIVE mg/dL
Leukocytes,Ua: NEGATIVE
Nitrite: NEGATIVE
Protein, ur: NEGATIVE mg/dL
Specific Gravity, Urine: 1.006 (ref 1.005–1.030)
pH: 6.5 (ref 5.0–8.0)

## 2023-06-26 LAB — AMMONIA: Ammonia: 10 umol/L (ref 9–35)

## 2023-06-26 LAB — TSH: TSH: 3.384 u[IU]/mL (ref 0.350–4.500)

## 2023-06-26 LAB — TROPONIN I (HIGH SENSITIVITY)
Troponin I (High Sensitivity): 7 ng/L (ref <18)
Troponin I (High Sensitivity): 7 ng/L (ref <18)

## 2023-06-26 LAB — T4, FREE: Free T4: 0.8 ng/dL (ref 0.61–1.12)

## 2023-06-26 MED ORDER — LORAZEPAM 1 MG PO TABS
1.0000 mg | ORAL_TABLET | Freq: Once | ORAL | Status: AC
  Administered 2023-06-26: 1 mg via ORAL
  Filled 2023-06-26: qty 1

## 2023-06-26 NOTE — ED Notes (Signed)
Reports anxiety/nervousness x3, unable to sleep, VSS, NAD noted.

## 2023-06-26 NOTE — ED Triage Notes (Signed)
Patient shaky in hands which he states is new for him.  Speech clear Resp WNL

## 2023-06-26 NOTE — ED Provider Notes (Signed)
[place] EMERGENCY DEPARTMENT AT [facility] Provider Note   CSN: [id number] Arrival date & time: 06/26/23  0849     History  Chief Complaint  Patient presents with   Anxiety    Edward Cuevas is a 67 y.o. male, hx of GAD, HTN, COPD, who presents to the ED 2/2 to feeling shaky, and having difficulty sleeping and poor appetite for the last 3 weeks.  He states he just feels shaky inside, little bit more short of breath.  Denies any recent triggers, or changes in medications.  He feels extremely anxious.  Endorses drinking about 4 beers a day, but has not missed any days.  He states he just feels more shaky, and notices that he is visibly shaking.  He shakes more when he tries to do something he states.  Does state that he has been a little bit more short of breath, but also states that he went when he gets anxious he gets more short of breath.  Denies any chest pain or palpitations.  Home Medications Prior to Admission medications   Medication Sig Start Date End Date Taking? Authorizing Provider  acetaminophen (TYLENOL) 650 MG CR tablet Take 650 mg by mouth 2 (two) times daily.    [provider]  ALPRAZolam Prudy Feeler) 0.5 MG tablet TAKE 1 TABLET(0.5 MG) BY MOUTH THREE TIMES DAILY 06/15/23   Mozingo, Thereasa Solo, NP  aspirin EC 81 MG tablet Take 1 tablet (81 mg total) by mouth daily. 05/31/19   Baldo Daub, MD  cyclobenzaprine (FLEXERIL) 10 MG tablet Take 1 tablet (10 mg total) by mouth 3 (three) times daily as needed for muscle spasms. 03/23/22   Breeback, Jade L, PA-C  DULoxetine (CYMBALTA) 60 MG capsule Take 1 capsule (60 mg total) by mouth daily. 10/25/22   Breeback, Jade L, PA-C  Fluticasone-Umeclidin-Vilant (TRELEGY ELLIPTA) 100-62.5-25 MCG/ACT AEPB Inhale 1 puff into the lungs daily. 10/25/22   Breeback, Lonna Cobb, PA-C  hydrocortisone (ANUSOL-HC) 2.5 % rectal cream APPLY RECTALLY TO THE AFFECTED AREA TWICE DAILY 09/15/21   Breeback, Jade L, PA-C  imiquimod (ALDARA) 5 %  cream Apply 1 Application topically at bedtime. 08/10/21   [provider]  MYRBETRIQ 50 MG TB24 tablet TAKE 1 TABLET(50 MG) BY MOUTH DAILY 03/29/23   Breeback, Jade L, PA-C  nitroGLYCERIN (NITROSTAT) 0.4 MG SL tablet Place 0.4 mg under the tongue every 5 (five) minutes as needed for chest pain.    [provider]  risperiDONE (RISPERDAL) 1 MG tablet TAKE ONE TABLET AT BEDTIME 10/25/22   Breeback, Jade L, PA-C  rosuvastatin (CRESTOR) 40 MG tablet Take 1 tablet (40 mg total) by mouth daily. 05/30/23   Revankar, Aundra Dubin, MD  tamsulosin (FLOMAX) 0.4 MG CAPS capsule Take 1 capsule (0.4 mg total) by mouth daily. 10/25/22   Breeback, Lonna Cobb, PA-C      Allergies    Abilify [aripiprazole] and Penicillins    Review of Systems   Review of Systems  Respiratory:  Positive for shortness of breath.   Cardiovascular:  Negative for chest pain.  Neurological:  Positive for tremors.    Physical Exam Updated Vital Signs BP 134/80   Pulse 78   Temp 98 F (36.7 C)   Resp 19   Ht 5\' 9"  (1.753 m)   Wt 77.1 kg   SpO2 96%   BMI 25.10 kg/m  Physical Exam Vitals and nursing note reviewed.  Constitutional:      General: He is not in acute distress.  Appearance: He is well-developed.  HENT:     Head: Normocephalic and atraumatic.  Eyes:     Conjunctiva/sclera: Conjunctivae normal.  Cardiovascular:     Rate and Rhythm: Normal rate and regular rhythm.     Heart sounds: No murmur heard. Pulmonary:     Effort: Pulmonary effort is normal. No respiratory distress.     Breath sounds: Normal breath sounds.  Abdominal:     Palpations: Abdomen is soft.     Tenderness: There is no abdominal tenderness.  Musculoskeletal:        General: No swelling.     Cervical back: Neck supple.  Skin:    General: Skin is warm and dry.     Capillary Refill: Capillary refill takes less than 2 seconds.  Neurological:     Mental Status: He is alert.     Cranial Nerves: No cranial nerve deficit.      Sensory: No sensory deficit.     Motor: No weakness.     Comments: Tremor on on exam, worse with intention. Good strength of bilateral upper and lower extremities 5 out of 5.  No other focal neurodeficits noted   Psychiatric:        Mood and Affect: Mood normal.     ED Results / Procedures / Treatments   Labs (all labs ordered are listed, but only abnormal results are displayed) Labs Reviewed  CBC WITH DIFFERENTIAL/PLATELET - Abnormal; Notable for the following components:      Result Value   RBC 6.11 (*)    Hemoglobin 19.1 (*)    HCT 55.1 (*)    All other components within normal limits  COMPREHENSIVE METABOLIC PANEL - Abnormal; Notable for the following components:   Glucose, Bld 103 (*)    Alkaline Phosphatase 36 (*)    All other components within normal limits  TSH  AMMONIA  URINALYSIS, ROUTINE W REFLEX MICROSCOPIC  T4, FREE  TROPONIN I (HIGH SENSITIVITY)  TROPONIN I (HIGH SENSITIVITY)    EKG EKG Interpretation Date/Time:  Sunday June 26 2023 09:09:51 EDT Ventricular Rate:  86 PR Interval:    QRS Duration:  70 QT Interval:  362 QTC Calculation: 433 R Axis:   -53  Text Interpretation: Normal sinus rhythm with artifact Left axis deviation Possible Lateral infarct , age undetermined Inferior infarct , age undetermined Abnormal ECG No previous ECGs available Confirmed by Derwood Kaplan ([id number]) on 06/26/2023 9:36:41 AM  Radiology DG Chest 2 View  Result Date: 06/26/2023 CLINICAL DATA:  Shortness of breath. EXAM: CHEST - 2 VIEW COMPARISON:  December 15, 2018. FINDINGS: The heart size and mediastinal contours are within normal limits. Both lungs are clear. The visualized skeletal structures are unremarkable. IMPRESSION: No active cardiopulmonary disease. Electronically Signed   By: Lupita Raider M.D.   On: 06/26/2023 10:00    Procedures Procedures    Medications Ordered in ED Medications  LORazepam (ATIVAN) tablet 1 mg (1 mg Oral Given 06/26/23 0940)    ED  Course/ Medical Decision Making/ A&P                                 Medical Decision Making Patient is a 67 year old male, here for tremor, he states has been going on for the last few weeks, and that he cannot eat or sleep.  He states he is very tired, and just feels shaky all over.  He notes that it is worse when  he does certain activities.  He does complain of some shortness of breath, but he states that this is a chronic issue given his COPD, but it may just be mildly worse than normal.  We will obtain EKG, troponins, TSH given tremors, blood work for further evaluation.  He is well-appearing on my exam, tremor worsens when he does anything with intention.  Amount and/or Complexity of Data Reviewed Labs: ordered.    Details: No acute findings, ammonia within normal limits, TSH within normal limits Radiology: ordered.    Details: Chest x-ray clear ECG/medicine tests:  Decision-making details documented in ED Course. Discussion of management or test interpretation with external provider(s): Discussed with patient, he is well-appearing, normal sinus rhythm on EKG, troponin negative, ammonia negative, TSH within normal limit.  Tremor resolved, will after Ativan, I am suspicious that his tremor might be secondary to his underlying severe anxiety.  I will have him follow-up with his PCP as well as psychiatrist for further evaluation.  He has no acute neurodeficits on exam, thus I do not think a head CT at this time is necessary.  We will discharge home, with strict return precautions  Risk Prescription drug management.   Final Clinical Impression(s) / ED Diagnoses Final diagnoses:  Tremor    Rx / DC Orders ED Discharge Orders     None         Harold Mattes, Harley Alto, PA 06/26/23 1235    Derwood Kaplan, MD 06/30/23 1709

## 2023-06-26 NOTE — Discharge Instructions (Signed)
Please follow-up with your primary care doctor, you need further evaluation.  I suspicious that this may be secondary to your anxiety, you should follow-up with your psychiatrist as well for management of your anxiety medications.  Return to the ER if you feel like your symptoms are getting worse, you have severe chest pain or shortness of breath, dizziness, or passout

## 2023-06-26 NOTE — ED Notes (Signed)
Pt teaching provided on medications that may cause drowsiness. Pt instructed not to drive or operate heavy machinery while taking the prescribed medication. Pt verbalized understanding.   Pt provided discharge instructions and prescription information. Pt was given the opportunity to ask questions and questions were answered.   

## 2023-06-26 NOTE — ED Triage Notes (Signed)
Onset of several weeks of feeling anxiety, difficulty sleeping, not eating.  Feeling "shaky inside"

## 2023-06-27 ENCOUNTER — Telehealth: Payer: Self-pay | Admitting: Adult Health

## 2023-06-27 NOTE — Telephone Encounter (Signed)
Pt called at 9:06a wanting to talk to Almira Coaster about his Xanax.  He said, "They think it might be causing a problem."  I don't know who "they" are.  Next appt 2/24

## 2023-06-28 NOTE — Telephone Encounter (Signed)
Patient seen 8/28    1. Cymbalta 60mg  daily   2. Risperdal 1mg  at hs  3. Xanax 0.5mg  TID - anxiety and panic attacks  He was seen in the ED on 9/8 for tremor. He was given Ativan and the tremors resolved. Tremors thought to be due to anxiety. Patient reporting that his benzo may need to be changed.

## 2023-06-28 NOTE — Telephone Encounter (Signed)
Spoke to pt at 1:53p.   He was driving.  I asked him to call back to schedule an appt with Almira Coaster before his current appt in Feb.

## 2023-06-28 NOTE — Telephone Encounter (Signed)
Please call to schedule an earlier appt.

## 2023-07-01 ENCOUNTER — Ambulatory Visit (INDEPENDENT_AMBULATORY_CARE_PROVIDER_SITE_OTHER): Payer: Medicare Other | Admitting: Physician Assistant

## 2023-07-01 VITALS — BP 152/82

## 2023-07-01 DIAGNOSIS — G4700 Insomnia, unspecified: Secondary | ICD-10-CM | POA: Diagnosis not present

## 2023-07-01 DIAGNOSIS — R251 Tremor, unspecified: Secondary | ICD-10-CM

## 2023-07-01 DIAGNOSIS — Z09 Encounter for follow-up examination after completed treatment for conditions other than malignant neoplasm: Secondary | ICD-10-CM

## 2023-07-01 DIAGNOSIS — F411 Generalized anxiety disorder: Secondary | ICD-10-CM | POA: Diagnosis not present

## 2023-07-01 DIAGNOSIS — F331 Major depressive disorder, recurrent, moderate: Secondary | ICD-10-CM

## 2023-07-01 HISTORY — DX: Tremor, unspecified: R25.1

## 2023-07-01 MED ORDER — RISPERIDONE 2 MG PO TABS: 2.0000 mg | ORAL_TABLET | Freq: Every day | ORAL | 0 refills | Status: DC

## 2023-07-01 MED ORDER — DULOXETINE HCL 30 MG PO CPEP
30.0000 mg | ORAL_CAPSULE | Freq: Every day | ORAL | 0 refills | Status: DC
Start: 2023-07-01 — End: 2023-08-10

## 2023-07-01 NOTE — Progress Notes (Unsigned)
Acute Office Visit  Subjective:     Patient ID: Edward Cuevas, male    DOB: 1956-08-17, 67 y.o.   MRN: [id number]  Chief Complaint  Patient presents with   Hospitalization Follow-up    Ed fup  for tremors pt was seen at ed 06/26/23    HPI Patient is in today for follow up on ED visit for tremors on 06/26/2023. His tremor and shakiness feeling started a few days before ED visit. He felt like he was shaking from the inside out. He has not been able to sleep. No known trigger. No pain. He feels overwhelmed and nervous. He has been on cymbalta but not taking anything right now for mood except xanax. Xanax does help. ED did:  ECG unremarkable No PE risk Normal CXR Basic labs normal  He has appt with [facility] on 23rd but does not feel he can make it until then.    .. Active Ambulatory Problems    Diagnosis Date Noted   Mixed hyperlipidemia    Hypertension    Prediabetes    Vitamin D deficiency    DJD 10/23/2013   DDD (degenerative disc disease), lumbosacral 10/23/2013   Panic attacks 10/04/2014   History of hepatitis C 10/08/2014   Right inguinal hernia 10/08/2014   GAD (generalized anxiety disorder) 11/01/2014   Actinic keratosis 06/27/2015   BPH without obstruction/lower urinary tract symptoms 11/25/2015   Chronic pain of multiple joints 11/25/2015   Fatty liver 10/01/2016   Chronic obstructive pulmonary disease (HCC) 12/29/2016   Lumbar radiculopathy 01/18/2017   Right carotid bruit 04/09/2019   Right lower quadrant abdominal pain 04/09/2019   Current smoker 04/09/2019   Abdominal distension (gaseous) 04/11/2019   Adrenal adenoma 04/17/2019   Carotid stenosis, right 04/17/2019   Centrilobular emphysema (HCC) 05/16/2019   Moderate episode of recurrent major depressive disorder (HCC) 05/16/2019   Aortic atherosclerosis (HCC) 05/16/2019   Coronary artery disease due to calcified coronary lesion 05/16/2019   Pulmonary nodules 05/16/2019   Dyspnea on exertion 05/31/2019   OAB  (overactive bladder) 07/12/2019   Hyperlipidemia    Hepatitis    Depression    COPD (chronic obstructive pulmonary disease) (HCC)    Cancer (HCC)    Arthritis    Anxiety    Chronic hepatitis C without hepatic coma (HCC) 03/09/2021   Positive colorectal cancer screening using Cologuard test 03/10/2021   Chronic left-sided low back pain with left-sided sciatica 03/22/2022   Pre-diabetes 10/26/2022   Insomnia 10/26/2022   Generalized anxiety disorder 10/26/2022   Shakiness 07/01/2023   Resolved Ambulatory Problems    Diagnosis Date Noted   Encounter for long-term (current) use of other medications 04/08/2014   Olecranon bursitis 01/31/2015   Urinary frequency 12/29/2016   Routine physical examination 12/29/2016   Right ankle pain 02/21/2017   No Additional Past Medical History        Objective:    BP (!) 152/82  BP Readings from Last 3 Encounters:  07/01/23 (!) 152/82  06/26/23 134/80  10/25/22 (!) 156/76   Wt Readings from Last 3 Encounters:  06/26/23 170 lb (77.1 kg)  10/25/22 184 lb (83.5 kg)  08/25/22 180 lb 0.6 oz (81.7 kg)   ..    10/25/2022    2:16 PM 10/13/2021    2:12 PM 06/29/2021    1:41 PM 03/24/2021    2:02 PM 07/14/2020    4:17 PM  Depression screen PHQ 2/9  Decreased Interest 1 0 0 0 0  Down, Depressed,  Hopeless 1 0 0 0 0  PHQ - 2 Score 2 0 0 0 0  Altered sleeping 0   0 1  Tired, decreased energy 1   0 2  Change in appetite 0   0 0  Feeling bad or failure about yourself  1   0 1  Trouble concentrating 0   0 0  Moving slowly or fidgety/restless 0   0 0  Suicidal thoughts 0   0 0  PHQ-9 Score 4   0 4  Difficult doing work/chores Not difficult at all   Not difficult at all Not difficult at all   .[name]    07/14/2020    4:17 PM 01/09/2020    1:43 PM 11/28/2019   10:15 AM 07/11/2019    3:23 PM  GAD 7 : Generalized Anxiety Score  Nervous, Anxious, on Edge 0 1 0 0  Control/stop worrying 0 1 0 0  Worry too much - different things 0 1 0 0  Trouble  relaxing 0 0 0 0  Restless 0 0 0 0  Easily annoyed or irritable 0 2 3 0  Afraid - awful might happen 0 1 0 0  Total GAD 7 Score 0 6 3 0  Anxiety Difficulty Not difficult at all Somewhat difficult Not difficult at all Not difficult at all       Physical Exam Constitutional:      General: He is in acute distress.  HENT:     Head: Normocephalic.  Cardiovascular:     Rate and Rhythm: Normal rate.     Pulses: Normal pulses.     Heart sounds: Normal heart sounds.  Pulmonary:     Effort: Pulmonary effort is normal.  Neurological:     General: No focal deficit present.     Mental Status: He is alert.     Comments: Struggled to think of answers to orientation questions  Psychiatric:     Comments: anxious          Assessment & Plan:  .Marland KitchenJohn was seen today for hospitalization follow-up.  Diagnoses and all orders for this visit:  Hospital discharge follow-up  Shakiness -     DULoxetine (CYMBALTA) 30 MG capsule; Take 1 capsule (30 mg total) by mouth daily.  Insomnia, unspecified type -     risperiDONE (RISPERDAL) 2 MG tablet; Take 1 tablet (2 mg total) by mouth at bedtime.  GAD (generalized anxiety disorder) -     DULoxetine (CYMBALTA) 30 MG capsule; Take 1 capsule (30 mg total) by mouth daily.  Moderate episode of recurrent major depressive disorder (HCC) -     DULoxetine (CYMBALTA) 30 MG capsule; Take 1 capsule (30 mg total) by mouth daily.   Restart cymbalta Increase Risperdal to 2mg  at bedtime to see if can start sleeping which will help with anxiety and panic  Keep [facility] appt on 23rd Give me follow up on Monday to let me know how you are doing and if sleeping better   Tandy Gaw, PA-C

## 2023-07-04 ENCOUNTER — Encounter: Payer: Self-pay | Admitting: Physician Assistant

## 2023-07-04 ENCOUNTER — Telehealth: Payer: Self-pay | Admitting: Physician Assistant

## 2023-07-04 NOTE — Telephone Encounter (Signed)
Patient called in to inform that his sleeping medication is working "okay". Sunday night he did not sleep but only 3 hours. Still very anxious on the inside.

## 2023-07-07 ENCOUNTER — Ambulatory Visit: Payer: Medicare Other | Admitting: Adult Health

## 2023-07-07 ENCOUNTER — Encounter: Payer: Self-pay | Admitting: Cardiology

## 2023-07-07 ENCOUNTER — Ambulatory Visit: Payer: Medicare Other | Attending: Cardiology | Admitting: Cardiology

## 2023-07-07 VITALS — BP 132/72 | HR 98 | Ht 69.0 in | Wt 174.1 lb

## 2023-07-07 DIAGNOSIS — I2584 Coronary atherosclerosis due to calcified coronary lesion: Secondary | ICD-10-CM | POA: Diagnosis not present

## 2023-07-07 DIAGNOSIS — I2510 Atherosclerotic heart disease of native coronary artery without angina pectoris: Secondary | ICD-10-CM

## 2023-07-07 DIAGNOSIS — E782 Mixed hyperlipidemia: Secondary | ICD-10-CM

## 2023-07-07 DIAGNOSIS — I700 Atherosclerosis of aorta: Secondary | ICD-10-CM | POA: Diagnosis not present

## 2023-07-07 DIAGNOSIS — F17200 Nicotine dependence, unspecified, uncomplicated: Secondary | ICD-10-CM | POA: Diagnosis not present

## 2023-07-07 DIAGNOSIS — J431 Panlobular emphysema: Secondary | ICD-10-CM

## 2023-07-07 DIAGNOSIS — F17210 Nicotine dependence, cigarettes, uncomplicated: Secondary | ICD-10-CM

## 2023-07-07 NOTE — Progress Notes (Signed)
Cardiology Office Note:    Date:  07/07/2023   ID:  Edward Cuevas, DOB January 12, 1956, MRN [id number]  PCP:  Jomarie Longs, PA-C  Cardiologist:  Garwin Brothers, MD   Referring MD: Jomarie Longs, PA-C    ASSESSMENT:    1. Aortic atherosclerosis (HCC)   2. Coronary artery disease due to calcified coronary lesion   3. Panlobular emphysema (HCC)   4. Mixed hyperlipidemia   5. Current smoker    PLAN:    In order of problems listed above:  Coronary artery disease: Secondary prevention stressed with the patient.  Importance of compliance with diet medication stressed any vocalized understanding.  He was urged to to walk on a regular basis to the best of his ability. Essential hypertension: Blood pressure stable and lifestyle modification urged diet emphasized.  Salt intake issues discussed with Mixed dyslipidemia: On lipid-lowering medications followed by primary care.  Lipids reviewed and discussed with him. Cigarette smoker: I spent 5 minutes with the patient discussing solely about smoking. Smoking cessation was counseled. I suggested to the patient also different medications and pharmacological interventions. Patient is keen to try stopping on its own at this time. He will get back to me if he needs any further assistance in this matter. Patient will be seen in follow-up appointment in 6 months or earlier if the patient has any concerns.  Medication Adjustments/Labs and Tests Ordered: Current medicines are reviewed at length with the patient today.  Concerns regarding medicines are outlined above.  No orders of the defined types were placed in this encounter.  No orders of the defined types were placed in this encounter.    No chief complaint on file.    History of Present Illness:    Edward Cuevas is a 67 y.o. male.  Patient has past medical history of coronary artery disease, essential hypertension, dyslipidemia, COPD and unfortunately continues to smoke.  He leads a sedentary  lifestyle.  He denies any chest pain orthopnea or PND.  At the time of my evaluation, the patient is alert awake oriented and in no distress.  Past Medical History:  Diagnosis Date   Abdominal distension (gaseous) 04/11/2019   Actinic keratosis 06/27/2015   Adrenal adenoma 04/17/2019   Bilateral CT abd 03/2019    Anxiety    Aortic atherosclerosis (HCC) 05/16/2019   Arthritis    BPH without obstruction/lower urinary tract symptoms 11/25/2015   Cancer (HCC)    skin   Carotid stenosis, right 04/17/2019   50-69 percent narrowed. Doppler 03/2019.    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   Coronary artery disease due to calcified coronary lesion 05/16/2019   Current smoker 04/09/2019   DDD (degenerative disc disease), lumbosacral 10/23/2013   opiod risk 1, low risk.  Failed UDS 05/2017 positive for marijuanna/alcohol.    Depression    DJD 10/23/2013   Dyspnea on exertion 05/31/2019   Fatty liver 10/01/2016   Via U/S 09/2016.    GAD (generalized anxiety disorder) 11/01/2014   Generalized anxiety disorder 10/26/2022   Hepatitis    A,B,C   History of hepatitis C 10/08/2014   Per pt cured.   Hyperlipidemia    Hypertension    Insomnia 10/26/2022  Lumbar radiculopathy 01/18/2017   Mixed hyperlipidemia    Moderate episode of recurrent major depressive disorder (HCC) 05/16/2019   OAB (overactive bladder) 07/12/2019   Panic attacks 10/04/2014   Positive colorectal cancer screening using Cologuard test 03/10/2021   Pre-diabetes 10/26/2022   Prediabetes    Pulmonary nodules 05/16/2019   Right carotid bruit 04/09/2019   Right inguinal hernia 10/08/2014   Right lower quadrant abdominal pain  04/09/2019   Shakiness 07/01/2023   Vitamin D deficiency     Past Surgical History:  Procedure Laterality Date   COLONOSCOPY  2011   [facility]   ELBOW SURGERY Right 10/18/1984   HEMORRHOID SURGERY  1977 and 1980   HERNIA REPAIR Left 10/18/1994   INGUINAL HERNIA REPAIR Right 12/30/2015   Procedure: RIGHT RIGHT INGUINAL HERNIA REPAIR;  Surgeon: Axel Filler, MD;  Location: [facility] OR;  Service: General;  Laterality: Right;   INSERTION OF MESH Right 12/30/2015   Procedure: INSERTION OF MESH;  Surgeon: Axel Filler, MD;  Location: [facility] OR;  Service: General;  Laterality: Right;   POLYPECTOMY  2011   [facility]   SHOULDER SURGERY Left 10/18/2005    Current Medications: Current Meds  Medication Sig   acetaminophen (TYLENOL) 650 MG CR tablet Take 650 mg by mouth 2 (two) times daily.   ALPRAZolam (XANAX) 0.5 MG tablet TAKE 1 TABLET(0.5 MG) BY MOUTH THREE TIMES DAILY   aspirin EC 81 MG tablet Take 1 tablet (81 mg total) by mouth daily.   cyclobenzaprine (FLEXERIL) 10 MG tablet Take 1 tablet (10 mg total) by mouth 3 (three) times daily as needed for muscle spasms.   DULoxetine (CYMBALTA) 30 MG capsule Take 1 capsule (30 mg total) by mouth daily.   Fluticasone-Umeclidin-Vilant (TRELEGY ELLIPTA) 100-62.5-25 MCG/ACT AEPB Inhale 1 puff into the lungs daily.   hydrocortisone (ANUSOL-HC) 2.5 % rectal cream APPLY RECTALLY TO THE AFFECTED AREA TWICE DAILY   imiquimod (ALDARA) 5 % cream Apply 1 Application topically at bedtime.   MYRBETRIQ 50 MG TB24 tablet TAKE 1 TABLET(50 MG) BY MOUTH DAILY   nitroGLYCERIN (NITROSTAT) 0.4 MG SL tablet Place 0.4 mg under the tongue every 5 (five) minutes as needed for chest pain.   risperiDONE (RISPERDAL) 2 MG tablet Take 1 tablet (2 mg total) by mouth at bedtime.   rosuvastatin (CRESTOR) 40 MG tablet Take 1 tablet (40 mg total) by mouth daily.   tamsulosin (FLOMAX) 0.4 MG CAPS capsule Take 1 capsule (0.4 mg total) by mouth daily.     Allergies:   Abilify [aripiprazole] and  Penicillins   Social History   Socioeconomic History   Marital status: Divorced    Spouse name: Not on file   Number of children: 2   Years of education: 11   Highest education level: 11th grade  Occupational History   Occupation: superintendant    Comment: retired  Tobacco Use   Smoking status: Every Day    Current packs/day: 1.00    Average packs/day: 1 pack/day for 30.0 years (30.0 ttl pk-yrs)    Types: Cigarettes   Smokeless tobacco: Never  Vaping Use   Vaping status: Some Days  Substance and Sexual Activity   Alcohol use: Yes    Alcohol/week: 4.0 standard drinks of alcohol    Types: 4 Cans of beer per week    Comment: 4 beers daily   Drug use: No   Sexual activity: Not Currently  Other Topics Concern   Not on file  Social History Narrative   Lives with his  ex-wife. He enjoys watching television.   Social Determinants of Health   Financial Resource Strain: Low Risk  (03/24/2021)   Overall Financial Resource Strain (CARDIA)    Difficulty of Paying Living Expenses: Not hard at all  Food Insecurity: No Food Insecurity (03/24/2021)   Hunger Vital Sign    Worried About Running Out of Food in the Last Year: Never true    Ran Out of Food in the Last Year: Never true  Transportation Needs: No Transportation Needs (03/24/2021)   PRAPARE - Administrator, Civil Service (Medical): No    Lack of Transportation (Non-Medical): No  Physical Activity: Inactive (03/24/2021)   Exercise Vital Sign    Days of Exercise per Week: 0 days    Minutes of Exercise per Session: 0 min  Stress: No Stress Concern Present (03/24/2021)   [employer] of Occupational Health - Occupational Stress Questionnaire    Feeling of Stress : Not at all  Social Connections: Socially Isolated (03/24/2021)   Social Connection and Isolation Panel [NHANES]    Frequency of Communication with Friends and Family: More than three times a week    Frequency of Social Gatherings with Friends and Family:  Never    Attends Religious Services: Never    Database administrator or Organizations: No    Attends Engineer, structural: Never    Marital Status: Divorced     Family History: The patient's family history includes Diabetes in his mother; Heart attack in his father; Heart disease in his father and maternal grandmother. There is no history of Colon cancer, Colon polyps, Esophageal cancer, Stomach cancer, or Rectal cancer.  ROS:   Please see the history of present illness.    All other systems reviewed and are negative.  EKGs/Labs/Other Studies Reviewed:    The following studies were reviewed today: I discussed my findings with the patient at length   Recent Labs: 06/26/2023: ALT 14; BUN 8; Creatinine, Ser 0.89; Hemoglobin 19.1; Platelets 152; Potassium 4.5; Sodium 136; TSH 3.384  Recent Lipid Panel    Component Value Date/Time   CHOL 113 09/13/2022 1041   TRIG 67 09/13/2022 1041   HDL 39 (L) 09/13/2022 1041   CHOLHDL 2.9 09/13/2022 1041   CHOLHDL 2.8 07/29/2020 1305   VLDL 13 08/24/2016 1347   LDLCALC 60 09/13/2022 1041   LDLCALC 67 07/29/2020 1305    Physical Exam:    VS:  BP 132/72   Pulse 98   Ht 5\' 9"  (1.753 m)   Wt 174 lb 1.3 oz (79 kg)   SpO2 91%   BMI 25.71 kg/m     Wt Readings from Last 3 Encounters:  07/07/23 174 lb 1.3 oz (79 kg)  06/26/23 170 lb (77.1 kg)  10/25/22 184 lb (83.5 kg)     GEN: Patient is in no acute distress HEENT: Normal NECK: No JVD; No carotid bruits LYMPHATICS: No lymphadenopathy CARDIAC: Hear sounds regular, 2/6 systolic murmur at the apex. RESPIRATORY:  Clear to auscultation without rales, wheezing or rhonchi  ABDOMEN: Soft, non-tender, non-distended MUSCULOSKELETAL:  No edema; No deformity  SKIN: Warm and dry NEUROLOGIC:  Alert and oriented x 3 PSYCHIATRIC:  Normal affect   Signed, Garwin Brothers, MD  07/07/2023 11:59 AM    [facility]

## 2023-07-07 NOTE — Patient Instructions (Signed)
Medication Instructions:  Your physician recommends that you continue on your current medications as directed. Please refer to the Current Medication list given to you today.  *If you need a refill on your cardiac medications before your next appointment, please call your pharmacy*   Lab Work: None ordered If you have labs (blood work) drawn today and your tests are completely normal, you will receive your results only by: MyChart Message (if you have MyChart) OR A paper copy in the mail If you have any lab test that is abnormal or we need to change your treatment, we will call you to review the results.   Testing/Procedures: None ordered   Follow-Up: At [facility], you and your health needs are our priority.  As part of our continuing mission to provide you with exceptional heart care, we have created designated Provider Care Teams.  These Care Teams include your primary Cardiologist (physician) and Advanced Practice Providers (APPs -  Physician Assistants and Nurse Practitioners) who all work together to provide you with the care you need, when you need it.  We recommend signing up for the patient portal called "MyChart".  Sign up information is provided on this After Visit Summary.  MyChart is used to connect with patients for Virtual Visits (Telemedicine).  Patients are able to view lab/test results, encounter notes, upcoming appointments, etc.  Non-urgent messages can be sent to your provider as well.   To learn more about what you can do with MyChart, go to [email or web address].    Your next appointment:   9 month(s)  The format for your next appointment:   In Person  Provider:   Belva Crome, MD    Other Instructions none  Important Information About Sugar

## 2023-07-08 ENCOUNTER — Telehealth: Payer: Self-pay | Admitting: Physician Assistant

## 2023-07-08 DIAGNOSIS — G4700 Insomnia, unspecified: Secondary | ICD-10-CM

## 2023-07-08 DIAGNOSIS — G478 Other sleep disorders: Secondary | ICD-10-CM

## 2023-07-08 NOTE — Telephone Encounter (Signed)
Pt called.  He has been taking new med for sleep for over a week and he is only getting 3-4 hrs of sleep. He would like to have a sleep study done.

## 2023-07-11 ENCOUNTER — Encounter: Payer: Self-pay | Admitting: Adult Health

## 2023-07-11 ENCOUNTER — Ambulatory Visit: Payer: Medicare Other | Admitting: Adult Health

## 2023-07-11 DIAGNOSIS — F411 Generalized anxiety disorder: Secondary | ICD-10-CM

## 2023-07-11 DIAGNOSIS — F331 Major depressive disorder, recurrent, moderate: Secondary | ICD-10-CM

## 2023-07-11 DIAGNOSIS — G4700 Insomnia, unspecified: Secondary | ICD-10-CM

## 2023-07-11 DIAGNOSIS — F410 Panic disorder [episodic paroxysmal anxiety] without agoraphobia: Secondary | ICD-10-CM

## 2023-07-11 MED ORDER — RISPERIDONE 2 MG PO TABS
2.0000 mg | ORAL_TABLET | Freq: Every day | ORAL | 0 refills | Status: DC
Start: 2023-07-11 — End: 2023-08-01

## 2023-07-11 MED ORDER — MIRTAZAPINE 15 MG PO TABS
15.0000 mg | ORAL_TABLET | Freq: Every day | ORAL | 2 refills | Status: DC
Start: 2023-07-11 — End: 2023-08-01

## 2023-07-11 NOTE — Progress Notes (Signed)
Edward Cuevas [id number] 01/08/1956 67 y.o.  Subjective:   Patient ID:  Edward Cuevas is a 67 y.o. (DOB August 15, 1956) male.  Chief Complaint: No chief complaint on file.   HPI Edward Cuevas presents to the office today for follow-up of MDD, anxiety, panic attacks, and insomnia.  Hospital  Describes mood today as "ok". Pleasant. Mood symptoms - reports depression and anxiety. Feels irritable at times - lack of sleep. Reports deceased panic attacks. Reports worry, rumination, and over thinking. Mood is lower. Stating "I don't feel like I'm doing too good". Reports taking medications as prescribed up until 06/30/2022. Has been working with his PCP - increased his Risperdal from 2mg  to 4mg . Feels like the increase has helped with sleep "some", but is only sleeping about 4 hours. Stable interest and motivation. Taking medications as prescribed.  Energy levels low. Active, does not have a regular exercise routine with physical disabilities.  Enjoys some usual interests and activities. Divorced. Lives with ex wife. Has a 36 year old son locally and another son in [place]. Has a sister in [place]. Spending time with family. Appetite adequate. Weight stable - 174 from 180 pounds. Sleeps well most nights. Averages 4 hours. Reports napping a few hours during the afternoon some days.    Focus and concentration stable. Completing tasks. Managing aspects of household. Receiving disability benefits/retirement. Retired Animal nutritionist.  Denies SI or HI.  Denies AH or VH. Denies self harm. Denies substance use. Consumes 4 beers a day.  Previous medication trials: Cymbalta, Wellbutrin    AUDIT    Flowsheet Row Office Visit from 03/24/2021 in [facility] Primary Care & [facility] Medicine at [facility]  Alcohol Use Disorder Identification Test Final Score (AUDIT) 9      GAD-7    Flowsheet Row Office Visit from 07/14/2020 in [facility] Primary Care & Sports Medicine at [facility] Office  Visit from 01/09/2020 in [facility] Primary Care & [facility] Medicine at [place] Office Visit from 11/28/2019 in [facility] Primary Care & Sports Medicine at [facility] Office Visit from 07/11/2019 in [facility] Primary Care & Sports Medicine at [facility] Office Visit from 05/16/2019 in [facility] Primary Care & Sports Medicine at [facility]  Total GAD-7 Score 0 6 3 0 1      PHQ2-9    Flowsheet Row Office Visit from 10/25/2022 in [facility] Medicine at [facility] Office Visit from 10/13/2021 in [facility] for Infectious Disease Office Visit from 06/29/2021 in [facility] for Infectious Disease Office Visit from 03/24/2021 in [facility] Primary Care & Sports Medicine at [facility] Office Visit from 07/14/2020 in [facility] Primary Care & Sports Medicine at [facility] Total Score 2 0 0 0 0  PHQ-9 Total Score 4 -- -- 0 4      Flowsheet Row ED from 06/26/2023 in [facility] Emergency Department at [facility]  C-SSRS RISK CATEGORY No Risk        Review of Systems:  Review of Systems  Musculoskeletal:  Negative for gait problem.  Neurological:  Negative for tremors.  Psychiatric/Behavioral:         Please refer to HPI    Medications: I have reviewed the patient's current medications.  Current Outpatient Medications  Medication Sig Dispense Refill   acetaminophen (TYLENOL) 650 MG CR tablet Take 650 mg by mouth 2 (two) times daily.     ALPRAZolam (XANAX) 0.5 MG tablet  TAKE 1 TABLET(0.5 MG) BY MOUTH THREE TIMES DAILY 90 tablet 2   aspirin EC 81 MG tablet Take 1 tablet (81 mg total) by mouth daily. 90 tablet 3   cyclobenzaprine (FLEXERIL) 10 MG tablet Take 1 tablet (10 mg total) by mouth 3 (three) times daily as needed for muscle spasms. 30 tablet 0   DULoxetine (CYMBALTA) 30 MG capsule Take 1 capsule (30 mg total) by mouth daily. 30 capsule 0    Fluticasone-Umeclidin-Vilant (TRELEGY ELLIPTA) 100-62.5-25 MCG/ACT AEPB Inhale 1 puff into the lungs daily. 180 each 1   hydrocortisone (ANUSOL-HC) 2.5 % rectal cream APPLY RECTALLY TO THE AFFECTED AREA TWICE DAILY 60 g 1   imiquimod (ALDARA) 5 % cream Apply 1 Application topically at bedtime.     MYRBETRIQ 50 MG TB24 tablet TAKE 1 TABLET(50 MG) BY MOUTH DAILY 90 tablet 1   nitroGLYCERIN (NITROSTAT) 0.4 MG SL tablet Place 0.4 mg under the tongue every 5 (five) minutes as needed for chest pain.     risperiDONE (RISPERDAL) 2 MG tablet Take 1 tablet (2 mg total) by mouth at bedtime. 30 tablet 0   rosuvastatin (CRESTOR) 40 MG tablet Take 1 tablet (40 mg total) by mouth daily. 90 tablet 0   tamsulosin (FLOMAX) 0.4 MG CAPS capsule Take 1 capsule (0.4 mg total) by mouth daily. 90 capsule 3   No current facility-administered medications for this visit.    Medication Side Effects: None  Allergies:  Allergies  Allergen Reactions   Abilify [Aripiprazole]     Feel weird.    Penicillins Itching and Rash    Has patient had a PCN reaction causing immediate rash, facial/tongue/throat swelling, SOB or lightheadedness with hypotension: Yes Has patient had a PCN reaction causing severe rash involving mucus membranes or skin necrosis: No Has patient had a PCN reaction that required hospitalization No Has patient had a PCN reaction occurring within the last 10 years: No If all of the above answers are "NO", then may proceed with Cephalosporin use.     Past Medical History:  Diagnosis Date   Abdominal distension (gaseous) 04/11/2019   Actinic keratosis 06/27/2015   Adrenal adenoma 04/17/2019   Bilateral CT abd 03/2019    Anxiety    Aortic atherosclerosis (HCC) 05/16/2019   Arthritis    BPH without obstruction/lower urinary tract symptoms 11/25/2015   Cancer (HCC)    skin   Carotid stenosis, right 04/17/2019   50-69 percent narrowed. Doppler 03/2019.    Centrilobular emphysema (HCC) 05/16/2019    Chronic hepatitis C without hepatic coma (HCC) 03/09/2021   Chronic left-sided low back pain with left-sided sciatica 03/22/2022   Chronic obstructive pulmonary disease (HCC) 12/29/2016   Chronic pain of multiple joints 11/25/2015   COPD (chronic obstructive pulmonary disease) (HCC)    per pt ?   Coronary artery disease due to calcified coronary lesion 05/16/2019   Current smoker 04/09/2019   DDD (degenerative disc disease), lumbosacral 10/23/2013   opiod risk 1, low risk.  Failed UDS 05/2017 positive for marijuanna/alcohol.    Depression    DJD 10/23/2013   Dyspnea on exertion 05/31/2019   Fatty liver 10/01/2016   Via U/S 09/2016.    GAD (generalized anxiety disorder) 11/01/2014   Generalized anxiety disorder 10/26/2022   Hepatitis    A,B,C   History of hepatitis C 10/08/2014   Per pt cured.   Hyperlipidemia    Hypertension    Insomnia 10/26/2022   Lumbar radiculopathy 01/18/2017   Mixed hyperlipidemia  Moderate episode of recurrent major depressive disorder (HCC) 05/16/2019   OAB (overactive bladder) 07/12/2019   Panic attacks 10/04/2014   Positive colorectal cancer screening using Cologuard test 03/10/2021   Pre-diabetes 10/26/2022   Prediabetes    Pulmonary nodules 05/16/2019   Right carotid bruit 04/09/2019   Right inguinal hernia 10/08/2014   Right lower quadrant abdominal pain 04/09/2019   Shakiness 07/01/2023   Vitamin D deficiency     Past Medical History, Surgical history, Social history, and Family history were reviewed and updated as appropriate.   Please see review of systems for further details on the patient's review from today.   Objective:   Physical Exam:  There were no vitals taken for this visit.  Physical Exam Constitutional:      General: He is not in acute distress. Musculoskeletal:        General: No deformity.  Neurological:     Mental Status: He is alert and oriented to person, place, and time.     Coordination: Coordination normal.   Psychiatric:        Attention and Perception: Attention and perception normal. He does not perceive auditory or visual hallucinations.        Mood and Affect: Affect is not labile, blunt, angry or inappropriate.        Speech: Speech normal.        Behavior: Behavior normal.        Thought Content: Thought content normal. Thought content is not paranoid or delusional. Thought content does not include homicidal or suicidal ideation. Thought content does not include homicidal or suicidal plan.        Cognition and Memory: Cognition and memory normal.        Judgment: Judgment normal.     Comments: Insight intact     Lab Review:     Component Value Date/Time   NA 136 06/26/2023 0945   NA 137 09/13/2022 1041   K 4.5 06/26/2023 0945   CL 98 06/26/2023 0945   CO2 31 06/26/2023 0945   GLUCOSE 103 (H) 06/26/2023 0945   BUN 8 06/26/2023 0945   BUN 9 09/13/2022 1041   CREATININE 0.89 06/26/2023 0945   CREATININE 0.90 04/27/2021 1355   CALCIUM 9.2 06/26/2023 0945   PROT 7.0 06/26/2023 0945   PROT 6.9 09/13/2022 1041   ALBUMIN 4.5 06/26/2023 0945   ALBUMIN 4.5 09/13/2022 1041   AST 21 06/26/2023 0945   ALT 14 06/26/2023 0945   ALT 77 (H) 03/09/2021 1421   ALKPHOS 36 (L) 06/26/2023 0945   BILITOT 0.5 06/26/2023 0945   BILITOT 0.4 09/13/2022 1041   GFRNONAA >60 06/26/2023 0945   GFRNONAA 83 02/25/2021 1340   GFRAA 96 02/25/2021 1340       Component Value Date/Time   WBC 5.8 06/26/2023 0945   RBC 6.11 (H) 06/26/2023 0945   HGB 19.1 (H) 06/26/2023 0945   HGB 18.0 (H) 09/13/2022 1041   HCT 55.1 (H) 06/26/2023 0945   HCT 52.3 (H) 09/13/2022 1041   PLT 152 06/26/2023 0945   PLT 177 09/13/2022 1041   MCV 90.2 06/26/2023 0945   MCV 90 09/13/2022 1041   MCH 31.3 06/26/2023 0945   MCHC 34.7 06/26/2023 0945   RDW 12.8 06/26/2023 0945   RDW 12.7 09/13/2022 1041   LYMPHSABS 2.2 06/26/2023 0945   MONOABS 0.5 06/26/2023 0945   EOSABS 0.0 06/26/2023 0945   BASOSABS 0.0 06/26/2023  0945    No results found for: "POCLITH", "LITHIUM"  No results found for: "PHENYTOIN", "PHENOBARB", "VALPROATE", "CBMZ"   .res Assessment: Plan:    Plan:  PDMP reviewed  1. Cymbalta 30mg  daily   2. Risperdal 2mg  at hs - now taking 4mg  at hs - will reduce back to 2mg  at hs 3. Xanax 0.5mg  TID - anxiety and panic attacks 4. Add Remeron 15mg  at hs  RTC 2 weeks  Patient advised to contact office with any questions, adverse effects, or acute worsening in signs and symptoms.  Discussed potential metabolic side effects associated with atypical antipsychotics, as well as potential risk for movement side effects. Advised pt to contact office if movement side effects occur.   Discussed potential benefits, risk, and side effects of benzodiazepines to include potential risk of tolerance and dependence, as well as possible drowsiness.  Advised patient not to drive if experiencing drowsiness and to take lowest possible effective dose to minimize risk of dependence and tolerance.   There are no diagnoses linked to this encounter.   Please see After Visit Summary for patient specific instructions.  Future Appointments  Date Time Provider Department Center  07/11/2023  1:40 PM Cedric Denison, Thereasa Solo, NP [facility] None  12/12/2023 12:00 PM Leata Dominy, Thereasa Solo, NP [facility] None    No orders of the defined types were placed in this encounter.   -------------------------------

## 2023-07-12 NOTE — Telephone Encounter (Signed)
If you are not sleeping a sleep test will be inclusive because it is checking for oxygenation and apnea events. Did your psychiatrist make any changes to your medications to help with sleep?

## 2023-07-13 NOTE — Telephone Encounter (Signed)
Ok your psychiatrist put refills on remeron so don't be worried about running out. I ordered sleep study.

## 2023-07-25 ENCOUNTER — Telehealth: Payer: Self-pay

## 2023-07-25 ENCOUNTER — Telehealth: Payer: Medicare Other | Admitting: Adult Health

## 2023-07-25 NOTE — Telephone Encounter (Signed)
Transition Care Management Unsuccessful Follow-up Telephone Call  Date of discharge and from where:  06/26/2023 [facility]  Attempts:  1st Attempt  Reason for unsuccessful TCM follow-up call:  Left voice message  [facility]  [facility], [facility] Resource Care Guide Direct Dial: [phone]  Website: [email or web address]

## 2023-07-25 NOTE — Telephone Encounter (Signed)
Transition Care Management Follow-up Telephone Call Date of discharge and from where: 06/26/2023 [facility] How have you been since you were released from the hospital? Patient stated he is feeling a little better. Any questions or concerns? No  Items Reviewed: Did the pt receive and understand the discharge instructions provided? Yes  Medications obtained and verified? Yes  Other? No  Any new allergies since your discharge? No  Dietary orders reviewed? Yes Do you have support at home? Yes   Follow up appointments reviewed:  PCP Hospital f/u appt confirmed? Yes  Scheduled to see Jade L. Caleen Essex, PA-C on 07/01/2023 @ [facility]. Specialist Hospital f/u appt confirmed? Yes  Scheduled to see Aundra Dubin. Revankar, MD on 07/07/2023 @ [facility]. Are transportation arrangements needed? No  If their condition worsens, is the pt aware to call PCP or go to the Emergency Dept.? Yes Was the patient provided with contact information for the PCP's office or ED? Yes Was to pt encouraged to call back with questions or concerns? Yes   Roxine Whittinghill [facility]  [facility], [facility] Guide Direct Dial: [phone]  Website: [email or web address]

## 2023-08-01 ENCOUNTER — Ambulatory Visit: Payer: Medicare Other | Admitting: Adult Health

## 2023-08-01 ENCOUNTER — Encounter: Payer: Self-pay | Admitting: Adult Health

## 2023-08-01 DIAGNOSIS — F410 Panic disorder [episodic paroxysmal anxiety] without agoraphobia: Secondary | ICD-10-CM | POA: Diagnosis not present

## 2023-08-01 DIAGNOSIS — G4700 Insomnia, unspecified: Secondary | ICD-10-CM

## 2023-08-01 DIAGNOSIS — F331 Major depressive disorder, recurrent, moderate: Secondary | ICD-10-CM

## 2023-08-01 DIAGNOSIS — F411 Generalized anxiety disorder: Secondary | ICD-10-CM | POA: Diagnosis not present

## 2023-08-01 MED ORDER — MIRTAZAPINE 15 MG PO TABS
15.0000 mg | ORAL_TABLET | Freq: Every day | ORAL | 2 refills | Status: DC
Start: 2023-08-01 — End: 2023-11-02

## 2023-08-01 MED ORDER — RISPERIDONE 2 MG PO TABS
2.0000 mg | ORAL_TABLET | Freq: Every day | ORAL | 2 refills | Status: DC
Start: 2023-08-01 — End: 2023-11-03

## 2023-08-01 MED ORDER — ALPRAZOLAM 0.5 MG PO TABS
ORAL_TABLET | ORAL | 2 refills | Status: DC
Start: 2023-08-01 — End: 2023-12-12

## 2023-08-01 NOTE — Progress Notes (Signed)
Edward Cuevas [id number] [birth year] 67 y.o.  Virtual Visit via Telephone Note  I connected with pt on 08/01/23 at  1:40 PM EDT by telephone and verified that I am speaking with the correct person using two identifiers.   I discussed the limitations, risks, security and privacy concerns of performing an evaluation and management service by telephone and the availability of in person appointments. I also discussed with the patient that there may be a patient responsible charge related to this service. The patient expressed understanding and agreed to proceed.   I discussed the assessment and treatment plan with the patient. The patient was provided an opportunity to ask questions and all were answered. The patient agreed with the plan and demonstrated an understanding of the instructions.   The patient was advised to call back or seek an in-person evaluation if the symptoms worsen or if the condition fails to improve as anticipated.  I provided 25 minutes of non-face-to-face time during this encounter.  The patient was located at home.  The provider was located at [facility].   Dorothyann Gibbs, NP   Subjective:   Patient ID:  Edward Cuevas is a 67 y.o. (DOB 01-18-1956) male.  Chief Complaint: No chief complaint on file.   HPI Edward Cuevas presents for follow-up of MDD, anxiety, panic attacks, and insomnia.   Describes mood today as "ok". Pleasant. Mood symptoms - reports decreased depression and anxiety. Reports decreased irritability with sleep. Denies recent panic attacks. Reports decreased worry, rumination, and over thinking. Mood has improved. Stating "I feel like I'm doing better". Reports taking medications as prescribed. Stable interest and motivation. Taking medications as prescribed.  Energy levels a little better. Active, does not have a regular exercise routine with physical disabilities.  Enjoys some usual interests and activities. Divorced. Lives with ex wife. Has a  73 year old son locally and another son in [place]. Has a sister in [place]. Spending time with family. Appetite adequate. Weight stable - 174 pounds. Sleeps well most nights. Averages 10 hours. Denies recent daytime napping.  Focus and concentration stable. Completing tasks. Managing aspects of household. Receiving disability benefits/retirement. Retired Animal nutritionist.  Denies SI or HI.  Denies AH or VH. Denies self harm. Denies substance use. Consumes 4 beers a day.  Previous medication trials: Cymbalta, Wellbutrin   Review of Systems:  Review of Systems  Musculoskeletal:  Negative for gait problem.  Neurological:  Negative for tremors.  Psychiatric/Behavioral:         Please refer to HPI    Medications: I have reviewed the patient's current medications.  Current Outpatient Medications  Medication Sig Dispense Refill   acetaminophen (TYLENOL) 650 MG CR tablet Take 650 mg by mouth 2 (two) times daily.     ALPRAZolam (XANAX) 0.5 MG tablet TAKE 1 TABLET(0.5 MG) BY MOUTH THREE TIMES DAILY 90 tablet 2   aspirin EC 81 MG tablet Take 1 tablet (81 mg total) by mouth daily. 90 tablet 3   cyclobenzaprine (FLEXERIL) 10 MG tablet Take 1 tablet (10 mg total) by mouth 3 (three) times daily as needed for muscle spasms. 30 tablet 0   DULoxetine (CYMBALTA) 30 MG capsule Take 1 capsule (30 mg total) by mouth daily. 30 capsule 0   Fluticasone-Umeclidin-Vilant (TRELEGY ELLIPTA) 100-62.5-25 MCG/ACT AEPB Inhale 1 puff into the lungs daily. 180 each 1   hydrocortisone (ANUSOL-HC) 2.5 % rectal cream APPLY RECTALLY TO THE AFFECTED AREA TWICE DAILY 60 g 1   imiquimod (ALDARA) 5 % cream  Apply 1 Application topically at bedtime.     mirtazapine (REMERON) 15 MG tablet Take 1 tablet (15 mg total) by mouth at bedtime. 30 tablet 2   MYRBETRIQ 50 MG TB24 tablet TAKE 1 TABLET(50 MG) BY MOUTH DAILY 90 tablet 1   nitroGLYCERIN (NITROSTAT) 0.4 MG SL tablet Place 0.4 mg under the tongue every 5 (five) minutes as  needed for chest pain.     risperiDONE (RISPERDAL) 2 MG tablet Take 1 tablet (2 mg total) by mouth at bedtime. 30 tablet 0   rosuvastatin (CRESTOR) 40 MG tablet Take 1 tablet (40 mg total) by mouth daily. 90 tablet 0   tamsulosin (FLOMAX) 0.4 MG CAPS capsule Take 1 capsule (0.4 mg total) by mouth daily. 90 capsule 3   No current facility-administered medications for this visit.    Medication Side Effects: None  Allergies:  Allergies  Allergen Reactions   Abilify [Aripiprazole]     Feel weird.    Penicillins Itching and Rash    Has patient had a PCN reaction causing immediate rash, facial/tongue/throat swelling, SOB or lightheadedness with hypotension: Yes Has patient had a PCN reaction causing severe rash involving mucus membranes or skin necrosis: No Has patient had a PCN reaction that required hospitalization No Has patient had a PCN reaction occurring within the last 10 years: No If all of the above answers are "NO", then may proceed with Cephalosporin use.     Past Medical History:  Diagnosis Date   Abdominal distension (gaseous) 04/11/2019   Actinic keratosis 06/27/2015   Adrenal adenoma 04/17/2019   Bilateral CT abd 03/2019    Anxiety    Aortic atherosclerosis (HCC) 05/16/2019   Arthritis    BPH without obstruction/lower urinary tract symptoms 11/25/2015   Cancer (HCC)    skin   Carotid stenosis, right 04/17/2019   50-69 percent narrowed. Doppler 03/2019.    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   Coronary artery disease due to calcified coronary lesion 05/16/2019   Current smoker 04/09/2019   DDD (degenerative disc disease), lumbosacral 10/23/2013   opiod risk 1, low  risk.  Failed UDS 05/2017 positive for marijuanna/alcohol.    Depression    DJD 10/23/2013   Dyspnea on exertion 05/31/2019   Fatty liver 10/01/2016   Via U/S 09/2016.    GAD (generalized anxiety disorder) 11/01/2014   Generalized anxiety disorder 10/26/2022   Hepatitis    A,B,C   History of hepatitis C 10/08/2014   Per pt cured.   Hyperlipidemia    Hypertension    Insomnia 10/26/2022   Lumbar radiculopathy 01/18/2017   Mixed hyperlipidemia    Moderate episode of recurrent major depressive disorder (HCC) 05/16/2019   OAB (overactive bladder) 07/12/2019   Panic attacks 10/04/2014   Positive colorectal cancer screening using Cologuard test 03/10/2021   Pre-diabetes 10/26/2022   Prediabetes    Pulmonary nodules 05/16/2019   Right carotid bruit 04/09/2019   Right inguinal hernia 10/08/2014   Right lower quadrant abdominal pain 04/09/2019   Shakiness 07/01/2023   Vitamin D deficiency     Family History  Problem Relation Age of Onset   Diabetes Mother    Heart disease Father    Heart attack Father  Heart disease Maternal Grandmother    Colon cancer Neg Hx    Colon polyps Neg Hx    Esophageal cancer Neg Hx    Stomach cancer Neg Hx    Rectal cancer Neg Hx     Social History   Socioeconomic History   Marital status: Divorced    Spouse name: Not on file   Number of children: 2   Years of education: 11   Highest education level: 11th grade  Occupational History   Occupation: superintendant    Comment: retired  Tobacco Use   Smoking status: Every Day    Current packs/day: 1.00    Average packs/day: 1 pack/day for 30.0 years (30.0 ttl pk-yrs)    Types: Cigarettes   Smokeless tobacco: Never  Vaping Use   Vaping status: Some Days  Substance and Sexual Activity   Alcohol use: Yes    Alcohol/week: 4.0 standard drinks of alcohol    Types: 4 Cans of beer per week    Comment: 4 beers daily   Drug use: No   Sexual activity: Not Currently  Other Topics Concern   Not  on file  Social History Narrative   Lives with his ex-wife. He enjoys watching television.   Social Determinants of Health   Financial Resource Strain: Low Risk  (03/24/2021)   Overall Financial Resource Strain (CARDIA)    Difficulty of Paying Living Expenses: Not hard at all  Food Insecurity: No Food Insecurity (03/24/2021)   Hunger Vital Sign    Worried About Running Out of Food in the Last Year: Never true    Ran Out of Food in the Last Year: Never true  Transportation Needs: No Transportation Needs (03/24/2021)   PRAPARE - Administrator, Civil Service (Medical): No    Lack of Transportation (Non-Medical): No  Physical Activity: Inactive (03/24/2021)   Exercise Vital Sign    Days of Exercise per Week: 0 days    Minutes of Exercise per Session: 0 min  Stress: No Stress Concern Present (03/24/2021)   [employer] of Occupational Health - Occupational Stress Questionnaire    Feeling of Stress : Not at all  Social Connections: Socially Isolated (03/24/2021)   Social Connection and Isolation Panel [NHANES]    Frequency of Communication with Friends and Family: More than three times a week    Frequency of Social Gatherings with Friends and Family: Never    Attends Religious Services: Never    Database administrator or Organizations: No    Attends Banker Meetings: Never    Marital Status: Divorced  Catering manager Violence: Not At Risk (03/24/2021)   Humiliation, Afraid, Rape, and Kick questionnaire    Fear of Current or Ex-Partner: No    Emotionally Abused: No    Physically Abused: No    Sexually Abused: No    Past Medical History, Surgical history, Social history, and Family history were reviewed and updated as appropriate.   Please see review of systems for further details on the patient's review from today.   Objective:   Physical Exam:  There were no vitals taken for this visit.  Physical Exam Constitutional:      General: He is not in acute  distress. Musculoskeletal:        General: No deformity.  Neurological:     Mental Status: He is alert and oriented to person, place, and time.     Coordination: Coordination normal.  Psychiatric:  Attention and Perception: Attention and perception normal. He does not perceive auditory or visual hallucinations.        Mood and Affect: Affect is not labile, blunt, angry or inappropriate.        Speech: Speech normal.        Behavior: Behavior normal.        Thought Content: Thought content normal. Thought content is not paranoid or delusional. Thought content does not include homicidal or suicidal ideation. Thought content does not include homicidal or suicidal plan.        Cognition and Memory: Cognition and memory normal.        Judgment: Judgment normal.     Comments: Insight intact     Lab Review:     Component Value Date/Time   NA 136 06/26/2023 0945   NA 137 09/13/2022 1041   K 4.5 06/26/2023 0945   CL 98 06/26/2023 0945   CO2 31 06/26/2023 0945   GLUCOSE 103 (H) 06/26/2023 0945   BUN 8 06/26/2023 0945   BUN 9 09/13/2022 1041   CREATININE 0.89 06/26/2023 0945   CREATININE 0.90 04/27/2021 1355   CALCIUM 9.2 06/26/2023 0945   PROT 7.0 06/26/2023 0945   PROT 6.9 09/13/2022 1041   ALBUMIN 4.5 06/26/2023 0945   ALBUMIN 4.5 09/13/2022 1041   AST 21 06/26/2023 0945   ALT 14 06/26/2023 0945   ALT 77 (H) 03/09/2021 1421   ALKPHOS 36 (L) 06/26/2023 0945   BILITOT 0.5 06/26/2023 0945   BILITOT 0.4 09/13/2022 1041   GFRNONAA >60 06/26/2023 0945   GFRNONAA 83 02/25/2021 1340   GFRAA 96 02/25/2021 1340       Component Value Date/Time   WBC 5.8 06/26/2023 0945   RBC 6.11 (H) 06/26/2023 0945   HGB 19.1 (H) 06/26/2023 0945   HGB 18.0 (H) 09/13/2022 1041   HCT 55.1 (H) 06/26/2023 0945   HCT 52.3 (H) 09/13/2022 1041   PLT 152 06/26/2023 0945   PLT 177 09/13/2022 1041   MCV 90.2 06/26/2023 0945   MCV 90 09/13/2022 1041   MCH 31.3 06/26/2023 0945   MCHC 34.7  06/26/2023 0945   RDW 12.8 06/26/2023 0945   RDW 12.7 09/13/2022 1041   LYMPHSABS 2.2 06/26/2023 0945   MONOABS 0.5 06/26/2023 0945   EOSABS 0.0 06/26/2023 0945   BASOSABS 0.0 06/26/2023 0945    No results found for: "POCLITH", "LITHIUM"   No results found for: "PHENYTOIN", "PHENOBARB", "VALPROATE", "CBMZ"   .res Assessment: Plan:    Plan:  PDMP reviewed  1. Cymbalta 30mg  daily   2. Risperdal 2mg  at hs 3. Xanax 0.5mg  TID - anxiety and panic attacks 4. Remeron 15mg  at hs  RTC 6 months  Patient advised to contact office with any questions, adverse effects, or acute worsening in signs and symptoms.  Discussed potential metabolic side effects associated with atypical antipsychotics, as well as potential risk for movement side effects. Advised pt to contact office if movement side effects occur.   Discussed potential benefits, risk, and side effects of benzodiazepines to include potential risk of tolerance and dependence, as well as possible drowsiness.  Advised patient not to drive if experiencing drowsiness and to take lowest possible effective dose to minimize risk of dependence and tolerance.  There are no diagnoses linked to this encounter.  Please see After Visit Summary for patient specific instructions.  Future Appointments  Date Time Provider Department Center  08/01/2023  1:40 PM Neddie Steedman, Thereasa Solo, NP [facility] None  12/12/2023  12:00 PM Malik Paar, Thereasa Solo, NP [facility] None    No orders of the defined types were placed in this encounter.     -------------------------------

## 2023-08-06 ENCOUNTER — Other Ambulatory Visit: Payer: Self-pay | Admitting: Physician Assistant

## 2023-08-06 DIAGNOSIS — F411 Generalized anxiety disorder: Secondary | ICD-10-CM

## 2023-08-06 DIAGNOSIS — F331 Major depressive disorder, recurrent, moderate: Secondary | ICD-10-CM

## 2023-08-06 DIAGNOSIS — R251 Tremor, unspecified: Secondary | ICD-10-CM

## 2023-10-21 ENCOUNTER — Other Ambulatory Visit: Payer: Self-pay | Admitting: Physician Assistant

## 2023-10-21 DIAGNOSIS — F331 Major depressive disorder, recurrent, moderate: Secondary | ICD-10-CM

## 2023-10-21 DIAGNOSIS — F411 Generalized anxiety disorder: Secondary | ICD-10-CM

## 2023-11-02 ENCOUNTER — Other Ambulatory Visit: Payer: Self-pay | Admitting: Adult Health

## 2023-11-02 DIAGNOSIS — G4700 Insomnia, unspecified: Secondary | ICD-10-CM

## 2023-11-25 ENCOUNTER — Other Ambulatory Visit: Payer: Self-pay | Admitting: Cardiology

## 2023-12-12 ENCOUNTER — Encounter: Payer: Self-pay | Admitting: Adult Health

## 2023-12-12 ENCOUNTER — Ambulatory Visit: Payer: Medicare Other | Admitting: Adult Health

## 2023-12-12 DIAGNOSIS — F410 Panic disorder [episodic paroxysmal anxiety] without agoraphobia: Secondary | ICD-10-CM

## 2023-12-12 DIAGNOSIS — F331 Major depressive disorder, recurrent, moderate: Secondary | ICD-10-CM | POA: Diagnosis not present

## 2023-12-12 DIAGNOSIS — R251 Tremor, unspecified: Secondary | ICD-10-CM

## 2023-12-12 DIAGNOSIS — G4700 Insomnia, unspecified: Secondary | ICD-10-CM | POA: Diagnosis not present

## 2023-12-12 DIAGNOSIS — F411 Generalized anxiety disorder: Secondary | ICD-10-CM

## 2023-12-12 MED ORDER — RISPERIDONE 2 MG PO TABS
2.0000 mg | ORAL_TABLET | Freq: Every day | ORAL | 2 refills | Status: DC
Start: 2023-12-12 — End: 2024-07-09

## 2023-12-12 MED ORDER — MIRTAZAPINE 15 MG PO TABS
15.0000 mg | ORAL_TABLET | Freq: Every day | ORAL | 2 refills | Status: DC
Start: 2023-12-12 — End: 2023-12-30

## 2023-12-12 MED ORDER — ALPRAZOLAM 0.5 MG PO TABS
ORAL_TABLET | ORAL | 2 refills | Status: DC
Start: 2023-12-12 — End: 2024-05-29

## 2023-12-12 MED ORDER — DULOXETINE HCL 30 MG PO CPEP
30.0000 mg | ORAL_CAPSULE | Freq: Every day | ORAL | 2 refills | Status: DC
Start: 2023-12-12 — End: 2024-02-28

## 2023-12-12 NOTE — Progress Notes (Signed)
 Edward Cuevas [id number] 1956/02/26 68 y.o.  Virtual Visit via Telephone Note  I connected with pt on 12/12/23 at 12:00 PM EST by telephone and verified that I am speaking with the correct person using two identifiers.   I discussed the limitations, risks, security and privacy concerns of performing an evaluation and management service by telephone and the availability of in person appointments. I also discussed with the patient that there may be a patient responsible charge related to this service. The patient expressed understanding and agreed to proceed.   I discussed the assessment and treatment plan with the patient. The patient was provided an opportunity to ask questions and all were answered. The patient agreed with the plan and demonstrated an understanding of the instructions.   The patient was advised to call back or seek an in-person evaluation if the symptoms worsen or if the condition fails to improve as anticipated.  I provided 25 minutes of non-face-to-face time during this encounter.  The patient was located at home.  The provider was located at [facility].   Dorothyann Gibbs, NP   Subjective:   Patient ID:  Edward Cuevas is a 68 y.o. (DOB Jan 04, 1956) male.  Chief Complaint: No chief complaint on file.   HPI Hildred Mollica presents for follow-up of MDD, anxiety, panic attacks, and insomnia.   Describes mood today as "ok". Pleasant. Mood symptoms - reports some depression. Reports stable interest and motivation. Denies anxiety or irritability. Denies recent panic attacks. Reports worry, rumination, and over thinking - "about my future". Mood has variable. Stating "I feel like I'm doing alright". Reports taking medications as prescribed. Taking medications as prescribed.  Energy levels  lower. Active, does not have a regular exercise routine with physical disabilities.  Enjoys some usual interests and activities. Divorced. Lives with ex wife. Has a 45 year old son  locally and another son in [place]. Has a sister in [place]. Spending time with family. Appetite adequate. Weight stable - 174 pounds. Sleeps well most nights. Averages 8 hours. Denies  daytime napping.  Focus and concentration stable. Completing tasks. Managing aspects of household. Receiving disability benefits/retirement. Retired Animal nutritionist.  Denies SI or HI.  Denies AH or VH. Denies self harm. Denies substance use. Consumes 3  beers a day.  Previous medication trials: Cymbalta, Wellbutrin   Review of Systems:  Review of Systems  Musculoskeletal:  Negative for gait problem.  Neurological:  Negative for tremors.  Psychiatric/Behavioral:         Please refer to HPI    Medications: I have reviewed the patient's current medications.  Current Outpatient Medications  Medication Sig Dispense Refill   acetaminophen (TYLENOL) 650 MG CR tablet Take 650 mg by mouth 2 (two) times daily.     ALPRAZolam (XANAX) 0.5 MG tablet TAKE 1 TABLET(0.5 MG) BY MOUTH THREE TIMES DAILY 90 tablet 2   aspirin EC 81 MG tablet Take 1 tablet (81 mg total) by mouth daily. 90 tablet 3   cyclobenzaprine (FLEXERIL) 10 MG tablet Take 1 tablet (10 mg total) by mouth 3 (three) times daily as needed for muscle spasms. 30 tablet 0   DULoxetine (CYMBALTA) 30 MG capsule TAKE 1 CAPSULE(30 MG) BY MOUTH DAILY 30 capsule 0   Fluticasone-Umeclidin-Vilant (TRELEGY ELLIPTA) 100-62.5-25 MCG/ACT AEPB Inhale 1 puff into the lungs daily. 180 each 1   hydrocortisone (ANUSOL-HC) 2.5 % rectal cream APPLY RECTALLY TO THE AFFECTED AREA TWICE DAILY 60 g 1   imiquimod (ALDARA) 5 % cream Apply 1  Application topically at bedtime.     mirtazapine (REMERON) 15 MG tablet TAKE 1 TABLET(15 MG) BY MOUTH AT BEDTIME 30 tablet 1   MYRBETRIQ 50 MG TB24 tablet TAKE 1 TABLET(50 MG) BY MOUTH DAILY 90 tablet 1   nitroGLYCERIN (NITROSTAT) 0.4 MG SL tablet Place 0.4 mg under the tongue every 5 (five) minutes as needed for chest pain.     risperiDONE  (RISPERDAL) 2 MG tablet TAKE 1 TABLET(2 MG) BY MOUTH AT BEDTIME 30 tablet 1   rosuvastatin (CRESTOR) 40 MG tablet TAKE 1 TABLET(40 MG) BY MOUTH DAILY 90 tablet 0   tamsulosin (FLOMAX) 0.4 MG CAPS capsule Take 1 capsule (0.4 mg total) by mouth daily. 90 capsule 3   No current facility-administered medications for this visit.    Medication Side Effects: None  Allergies:  Allergies  Allergen Reactions   Abilify [Aripiprazole]     Feel weird.    Penicillins Itching and Rash    Has patient had a PCN reaction causing immediate rash, facial/tongue/throat swelling, SOB or lightheadedness with hypotension: Yes Has patient had a PCN reaction causing severe rash involving mucus membranes or skin necrosis: No Has patient had a PCN reaction that required hospitalization No Has patient had a PCN reaction occurring within the last 10 years: No If all of the above answers are "NO", then may proceed with Cephalosporin use.     Past Medical History:  Diagnosis Date   Abdominal distension (gaseous) 04/11/2019   Actinic keratosis 06/27/2015   Adrenal adenoma 04/17/2019   Bilateral CT abd 03/2019    Anxiety    Aortic atherosclerosis (HCC) 05/16/2019   Arthritis    BPH without obstruction/lower urinary tract symptoms 11/25/2015   Cancer (HCC)    skin   Carotid stenosis, right 04/17/2019   50-69 percent narrowed. Doppler 03/2019.    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   Coronary artery disease due to calcified coronary lesion 05/16/2019   Current smoker 04/09/2019   DDD (degenerative disc disease), lumbosacral 10/23/2013   opiod risk 1, low risk.  Failed UDS 05/2017 positive for marijuanna/alcohol.     Depression    DJD 10/23/2013   Dyspnea on exertion 05/31/2019   Fatty liver 10/01/2016   Via U/S 09/2016.    GAD (generalized anxiety disorder) 11/01/2014   Generalized anxiety disorder 10/26/2022   Hepatitis    A,B,C   History of hepatitis C 10/08/2014   Per pt cured.   Hyperlipidemia    Hypertension    Insomnia 10/26/2022   Lumbar radiculopathy 01/18/2017   Mixed hyperlipidemia    Moderate episode of recurrent major depressive disorder (HCC) 05/16/2019   OAB (overactive bladder) 07/12/2019   Panic attacks 10/04/2014   Positive colorectal cancer screening using Cologuard test 03/10/2021   Pre-diabetes 10/26/2022   Prediabetes    Pulmonary nodules 05/16/2019   Right carotid bruit 04/09/2019   Right inguinal hernia 10/08/2014   Right lower quadrant abdominal pain 04/09/2019   Shakiness 07/01/2023   Vitamin D deficiency     Family History  Problem Relation Age of Onset   Diabetes Mother    Heart disease Father    Heart attack Father    Heart disease Maternal Grandmother  Colon cancer Neg Hx    Colon polyps Neg Hx    Esophageal cancer Neg Hx    Stomach cancer Neg Hx    Rectal cancer Neg Hx     Social History   Socioeconomic History   Marital status: Divorced    Spouse name: Not on file   Number of children: 2   Years of education: 11   Highest education level: 11th grade  Occupational History   Occupation: superintendant    Comment: retired  Tobacco Use   Smoking status: Every Day    Current packs/day: 1.00    Average packs/day: 1 pack/day for 30.0 years (30.0 ttl pk-yrs)    Types: Cigarettes   Smokeless tobacco: Never  Vaping Use   Vaping status: Some Days  Substance and Sexual Activity   Alcohol use: Yes    Alcohol/week: 4.0 standard drinks of alcohol    Types: 4 Cans of beer per week    Comment: 4 beers daily   Drug use: No   Sexual activity: Not Currently  Other Topics Concern   Not on file  Social History Narrative   Lives with his  ex-wife. He enjoys watching television.   Social Drivers of Corporate investment banker Strain: Low Risk  (03/24/2021)   Overall Financial Resource Strain (CARDIA)    Difficulty of Paying Living Expenses: Not hard at all  Food Insecurity: No Food Insecurity (03/24/2021)   Hunger Vital Sign    Worried About Running Out of Food in the Last Year: Never true    Ran Out of Food in the Last Year: Never true  Transportation Needs: No Transportation Needs (03/24/2021)   PRAPARE - Administrator, Civil Service (Medical): No    Lack of Transportation (Non-Medical): No  Physical Activity: Inactive (03/24/2021)   Exercise Vital Sign    Days of Exercise per Week: 0 days    Minutes of Exercise per Session: 0 min  Stress: No Stress Concern Present (03/24/2021)   [employer] of Occupational Health - Occupational Stress Questionnaire    Feeling of Stress : Not at all  Social Connections: Socially Isolated (03/24/2021)   Social Connection and Isolation Panel [NHANES]    Frequency of Communication with Friends and Family: More than three times a week    Frequency of Social Gatherings with Friends and Family: Never    Attends Religious Services: Never    Database administrator or Organizations: No    Attends Banker Meetings: Never    Marital Status: Divorced  Catering manager Violence: Not At Risk (03/24/2021)   Humiliation, Afraid, Rape, and Kick questionnaire    Fear of Current or Ex-Partner: No    Emotionally Abused: No    Physically Abused: No    Sexually Abused: No    Past Medical History, Surgical history, Social history, and Family history were reviewed and updated as appropriate.   Please see review of systems for further details on the patient's review from today.   Objective:   Physical Exam:  There were no vitals taken for this visit.  Physical Exam Constitutional:      General: He is not in acute distress. Musculoskeletal:        General: No deformity.   Neurological:     Mental Status: He is alert and oriented to person, place, and time.     Coordination: Coordination normal.  Psychiatric:        Attention and Perception: Attention and perception  normal. He does not perceive auditory or visual hallucinations.        Mood and Affect: Affect is not labile, blunt, angry or inappropriate.        Speech: Speech normal.        Behavior: Behavior normal.        Thought Content: Thought content normal. Thought content is not paranoid or delusional. Thought content does not include homicidal or suicidal ideation. Thought content does not include homicidal or suicidal plan.        Cognition and Memory: Cognition and memory normal.        Judgment: Judgment normal.     Comments: Insight intact     Lab Review:     Component Value Date/Time   NA 136 06/26/2023 0945   NA 137 09/13/2022 1041   K 4.5 06/26/2023 0945   CL 98 06/26/2023 0945   CO2 31 06/26/2023 0945   GLUCOSE 103 (H) 06/26/2023 0945   BUN 8 06/26/2023 0945   BUN 9 09/13/2022 1041   CREATININE 0.89 06/26/2023 0945   CREATININE 0.90 04/27/2021 1355   CALCIUM 9.2 06/26/2023 0945   PROT 7.0 06/26/2023 0945   PROT 6.9 09/13/2022 1041   ALBUMIN 4.5 06/26/2023 0945   ALBUMIN 4.5 09/13/2022 1041   AST 21 06/26/2023 0945   ALT 14 06/26/2023 0945   ALT 77 (H) 03/09/2021 1421   ALKPHOS 36 (L) 06/26/2023 0945   BILITOT 0.5 06/26/2023 0945   BILITOT 0.4 09/13/2022 1041   GFRNONAA >60 06/26/2023 0945   GFRNONAA 83 02/25/2021 1340   GFRAA 96 02/25/2021 1340       Component Value Date/Time   WBC 5.8 06/26/2023 0945   RBC 6.11 (H) 06/26/2023 0945   HGB 19.1 (H) 06/26/2023 0945   HGB 18.0 (H) 09/13/2022 1041   HCT 55.1 (H) 06/26/2023 0945   HCT 52.3 (H) 09/13/2022 1041   PLT 152 06/26/2023 0945   PLT 177 09/13/2022 1041   MCV 90.2 06/26/2023 0945   MCV 90 09/13/2022 1041   MCH 31.3 06/26/2023 0945   MCHC 34.7 06/26/2023 0945   RDW 12.8 06/26/2023 0945   RDW 12.7 09/13/2022  1041   LYMPHSABS 2.2 06/26/2023 0945   MONOABS 0.5 06/26/2023 0945   EOSABS 0.0 06/26/2023 0945   BASOSABS 0.0 06/26/2023 0945    No results found for: "POCLITH", "LITHIUM"   No results found for: "PHENYTOIN", "PHENOBARB", "VALPROATE", "CBMZ"   .res Assessment: Plan:    Plan:  PDMP reviewed  1. Cymbalta 30mg  daily   2. Risperdal 2mg  at hs 3. Xanax 0.5mg  TID - anxiety and panic attacks 4. Remeron 15mg  at hs  RTC 3 months  25 minutes spent dedicated to the care of this patient on the date of this encounter to include pre-visit review of records, ordering of medication, post visit documentation, and face-to-face time with the patient discussing MDD, anxiety, panic attacks, and insomnia. Discussed continuing current medication regimen.  Patient advised to contact office with any questions, adverse effects, or acute worsening in signs and symptoms.  Discussed potential metabolic side effects associated with atypical antipsychotics, as well as potential risk for movement side effects. Advised pt to contact office if movement side effects occur.   Discussed potential benefits, risk, and side effects of benzodiazepines to include potential risk of tolerance and dependence, as well as possible drowsiness.  Advised patient not to drive if experiencing drowsiness and to take lowest possible effective dose to minimize risk of dependence and tolerance.  There are no diagnoses linked to this encounter.  Please see After Visit Summary for patient specific instructions.  Future Appointments  Date Time Provider Department Center  12/12/2023 12:00 PM Louis Ivery, Thereasa Solo, NP [facility] None    No orders of the defined types were placed in this encounter.     -------------------------------

## 2023-12-30 ENCOUNTER — Other Ambulatory Visit: Payer: Self-pay | Admitting: Adult Health

## 2023-12-30 DIAGNOSIS — G4700 Insomnia, unspecified: Secondary | ICD-10-CM

## 2024-02-23 ENCOUNTER — Other Ambulatory Visit: Payer: Self-pay | Admitting: Cardiology

## 2024-02-23 NOTE — Telephone Encounter (Signed)
 Rx refill sent to pharmacy.

## 2024-02-28 ENCOUNTER — Ambulatory Visit: Payer: Medicare Other | Admitting: Adult Health

## 2024-02-28 ENCOUNTER — Other Ambulatory Visit: Payer: Self-pay | Admitting: Physician Assistant

## 2024-02-28 ENCOUNTER — Encounter: Payer: Self-pay | Admitting: Adult Health

## 2024-02-28 DIAGNOSIS — F411 Generalized anxiety disorder: Secondary | ICD-10-CM

## 2024-02-28 DIAGNOSIS — F331 Major depressive disorder, recurrent, moderate: Secondary | ICD-10-CM

## 2024-02-28 DIAGNOSIS — F410 Panic disorder [episodic paroxysmal anxiety] without agoraphobia: Secondary | ICD-10-CM

## 2024-02-28 DIAGNOSIS — G4700 Insomnia, unspecified: Secondary | ICD-10-CM

## 2024-02-28 DIAGNOSIS — R251 Tremor, unspecified: Secondary | ICD-10-CM

## 2024-02-28 MED ORDER — CLONAZEPAM 0.5 MG PO TABS
0.5000 mg | ORAL_TABLET | Freq: Three times a day (TID) | ORAL | 2 refills | Status: DC | PRN
Start: 2024-02-28 — End: 2024-05-28

## 2024-02-28 MED ORDER — DULOXETINE HCL 30 MG PO CPEP
30.0000 mg | ORAL_CAPSULE | Freq: Every day | ORAL | 0 refills | Status: DC
Start: 2024-02-28 — End: 2024-03-08

## 2024-02-28 NOTE — Telephone Encounter (Signed)
 Copied from CRM [id number]. Topic: Clinical - Medication Refill >> Feb 28, 2024  1:46 PM Shamecia H wrote: Medication: DULoxetine  (CYMBALTA ) 40 MG capsule  Has the patient contacted their pharmacy? Yes (Agent: If no, request that the patient contact the pharmacy for the refill. If patient does not wish to contact the pharmacy document the reason why and proceed with request.) (Agent: If yes, when and what did the pharmacy advise?)  This is the patient's preferred pharmacy:  [id number] [place], [place] - [address] AT [facility] OF [facility] [address] [facility] [address] [place] [address] Phone: [phone] Fax: [phone]  Is this the correct pharmacy for this prescription? Yes If no, delete pharmacy and type the correct one.   Has the prescription been filled recently? No  Is the patient out of the medication? Yes  Has the patient been seen for an appointment in the last year OR does the patient have an upcoming appointment? Yes  Can we respond through MyChart? Yes  Agent: Please be advised that Rx refills may take up to 3 business days. We ask that you follow-up with your pharmacy.

## 2024-02-28 NOTE — Progress Notes (Signed)
 Edward Cuevas [id number] 1956-07-12 68 y.o.  Subjective:   Patient ID:  Edward Cuevas is a 68 y.o. (DOB [birth year]) male.  Chief Complaint: No chief complaint on file.   HPI Edward Cuevas presents to the office today for follow-up of MDD, anxiety, panic attacks, and insomnia.  Describes mood today as "ok". Pleasant. Mood symptoms - reports increased anxiety and depression. Feels like symptoms are related to "all the changes in the world". Denies irritability. Reports decreased interest and motivation. Denies recent panic attacks. Reports worry, rumination and over thinking - "about everything". Reports mood is stable. Stating "I feel like I'm struggling with increased anxiety". Does not feel the Xanax  is helpful to manage mood symptoms and would like to make a change. Reports taking medications as prescribed.  Energy levels lower. Active, does not have a regular exercise routine with physical disabilities.  Enjoys some usual interests and activities. Divorced. Lives with ex wife. Has 2 grown sons. Has a sister local. Spending time with family. Appetite adequate. Weight loss - 150 from 174 pounds. Sleeps well most nights. Averages 8 hours. Denies daytime napping.  Reports focus and concentration difficulties. Completing tasks. Managing aspects of household. Receiving disability benefits/retirement. Retired Animal nutritionist.  Denies SI or HI.  Denies AH or VH. Denies self harm. Denies substance use. Consumes 3 beers a day.  Previous medication trials: Cymbalta , Wellbutrin    AUDIT    Flowsheet Row Office Visit from 03/24/2021 in [facility] Primary Care & Sports Medicine at [facility]  Alcohol Use Disorder Identification Test Final Score (AUDIT) 9      GAD-7    Flowsheet Row Office Visit from 07/14/2020 in [facility] Primary Care & Sports Medicine at [facility] Office Visit from 01/09/2020 in [facility] Primary Care & Sports Medicine at [facility] Office Visit from  11/28/2019 in [facility] Primary Care & Sports Medicine at [facility] Office Visit from 07/11/2019 in [facility] Primary Care & Sports Medicine at [facility] Office Visit from 05/16/2019 in [facility] Primary Care & Sports Medicine at [facility]  Total GAD-7 Score 0 6 3 0 1      PHQ2-9    Flowsheet Row Office Visit from 10/25/2022 in [facility] Primary Care & Sports Medicine at [facility] Office Visit from 10/13/2021 in [facility] Dis - A Dept Of [place] Office Visit from 06/29/2021 in [facility] Infect Dis - A Dept Of [place] Office Visit from 03/24/2021 in [facility] Primary Care & Sports Medicine at [facility] Office Visit from 07/14/2020 in [facility] Primary Care & Sports Medicine at [facility] Total Score 2 0 0 0 0  PHQ-9 Total Score 4 -- -- 0 4      Flowsheet Row ED from 06/26/2023 in [facility] Emergency Department at [facility]  C-SSRS RISK CATEGORY No Risk        Review of Systems:  Review of Systems  Musculoskeletal:  Negative for gait problem.  Neurological:  Negative for tremors.  Psychiatric/Behavioral:         Please refer to HPI    Medications: I have reviewed the patient's current medications.  Current Outpatient Medications  Medication Sig Dispense Refill   clonazePAM  (KLONOPIN ) 0.5 MG tablet Take 1 tablet (0.5 mg total) by mouth 3 (three) times daily as needed for anxiety. 90 tablet 2   acetaminophen  (TYLENOL ) 650 MG CR tablet Take 650 mg  by mouth 2 (two) times daily.     ALPRAZolam  (XANAX ) 0.5 MG tablet TAKE 1 TABLET(0.5 MG) BY MOUTH THREE TIMES DAILY 90 tablet 2   aspirin  EC 81 MG tablet Take 1 tablet (81 mg total) by mouth daily. 90 tablet 3   cyclobenzaprine  (FLEXERIL ) 10 MG tablet Take 1 tablet (10 mg total) by mouth 3 (three) times daily as needed for muscle spasms. 30 tablet 0   DULoxetine  (CYMBALTA )  30 MG capsule Take 1 capsule (30 mg total) by mouth daily. 30 capsule 2   Fluticasone -Umeclidin-Vilant (TRELEGY ELLIPTA ) 100-62.5-25 MCG/ACT AEPB Inhale 1 puff into the lungs daily. 180 each 1   hydrocortisone  (ANUSOL -HC) 2.5 % rectal cream APPLY RECTALLY TO THE AFFECTED AREA TWICE DAILY 60 g 1   imiquimod  (ALDARA ) 5 % cream Apply 1 Application topically at bedtime.     mirtazapine  (REMERON ) 15 MG tablet TAKE 1 TABLET(15 MG) BY MOUTH AT BEDTIME 30 tablet 2   MYRBETRIQ  50 MG TB24 tablet TAKE 1 TABLET(50 MG) BY MOUTH DAILY 90 tablet 1   nitroGLYCERIN  (NITROSTAT ) 0.4 MG SL tablet Place 0.4 mg under the tongue every 5 (five) minutes as needed for chest pain.     risperiDONE  (RISPERDAL ) 2 MG tablet Take 1 tablet (2 mg total) by mouth at bedtime. 30 tablet 2   rosuvastatin  (CRESTOR ) 40 MG tablet Take 1 tablet (40 mg total) by mouth daily. 90 tablet 0   tamsulosin  (FLOMAX ) 0.4 MG CAPS capsule Take 1 capsule (0.4 mg total) by mouth daily. 90 capsule 3   No current facility-administered medications for this visit.    Medication Side Effects: None  Allergies:  Allergies  Allergen Reactions   Abilify  [Aripiprazole ]     Feel weird.    Penicillins Itching and Rash    Has patient had a PCN reaction causing immediate rash, facial/tongue/throat swelling, SOB or lightheadedness with hypotension: Yes Has patient had a PCN reaction causing severe rash involving mucus membranes or skin necrosis: No Has patient had a PCN reaction that required hospitalization No Has patient had a PCN reaction occurring within the last 10 years: No If all of the above answers are "NO", then may proceed with Cephalosporin use.     Past Medical History:  Diagnosis Date   Abdominal distension (gaseous) 04/11/2019   Actinic keratosis 06/27/2015   Adrenal adenoma 04/17/2019   Bilateral CT abd 03/2019    Anxiety    Aortic atherosclerosis (HCC) 05/16/2019   Arthritis    BPH without obstruction/lower urinary tract symptoms  11/25/2015   Cancer (HCC)    skin   Carotid stenosis, right 04/17/2019   50-69 percent narrowed. Doppler 03/2019.    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   Coronary artery disease due to calcified coronary lesion 05/16/2019   Current smoker 04/09/2019   DDD (degenerative disc disease), lumbosacral 10/23/2013   opiod risk 1, low risk.  Failed UDS 05/2017 positive for marijuanna/alcohol.    Depression    DJD 10/23/2013   Dyspnea on exertion 05/31/2019   Fatty liver 10/01/2016   Via U/S 09/2016.    GAD (generalized anxiety disorder) 11/01/2014   Generalized anxiety disorder 10/26/2022   Hepatitis    A,B,C   History  of hepatitis C 10/08/2014   Per pt cured.   Hyperlipidemia    Hypertension    Insomnia 10/26/2022   Lumbar radiculopathy 01/18/2017   Mixed hyperlipidemia    Moderate episode of recurrent major depressive disorder (HCC) 05/16/2019   OAB (overactive bladder) 07/12/2019   Panic attacks 10/04/2014   Positive colorectal cancer screening using Cologuard test 03/10/2021   Pre-diabetes 10/26/2022   Prediabetes    Pulmonary nodules 05/16/2019   Right carotid bruit 04/09/2019   Right inguinal hernia 10/08/2014   Right lower quadrant abdominal pain 04/09/2019   Shakiness 07/01/2023   Vitamin D  deficiency     Past Medical History, Surgical history, Social history, and Family history were reviewed and updated as appropriate.   Please see review of systems for further details on the patient's review from today.   Objective:   Physical Exam:  There were no vitals taken for this visit.  Physical Exam  Lab Review:     Component Value Date/Time   NA 136 06/26/2023 0945    NA 137 09/13/2022 1041   K 4.5 06/26/2023 0945   CL 98 06/26/2023 0945   CO2 31 06/26/2023 0945   GLUCOSE 103 (H) 06/26/2023 0945   BUN 8 06/26/2023 0945   BUN 9 09/13/2022 1041   CREATININE 0.89 06/26/2023 0945   CREATININE 0.90 04/27/2021 1355   CALCIUM  9.2 06/26/2023 0945   PROT 7.0 06/26/2023 0945   PROT 6.9 09/13/2022 1041   ALBUMIN 4.5 06/26/2023 0945   ALBUMIN 4.5 09/13/2022 1041   AST 21 06/26/2023 0945   ALT 14 06/26/2023 0945   ALT 77 (H) 03/09/2021 1421   ALKPHOS 36 (L) 06/26/2023 0945   BILITOT 0.5 06/26/2023 0945   BILITOT 0.4 09/13/2022 1041   GFRNONAA >60 06/26/2023 0945   GFRNONAA 83 02/25/2021 1340   GFRAA 96 02/25/2021 1340       Component Value Date/Time   WBC 5.8 06/26/2023 0945   RBC 6.11 (H) 06/26/2023 0945   HGB 19.1 (H) 06/26/2023 0945   HGB 18.0 (H) 09/13/2022 1041   HCT 55.1 (H) 06/26/2023 0945   HCT 52.3 (H) 09/13/2022 1041   PLT 152 06/26/2023 0945   PLT 177 09/13/2022 1041   MCV 90.2 06/26/2023 0945   MCV 90 09/13/2022 1041   MCH 31.3 06/26/2023 0945   MCHC 34.7 06/26/2023 0945   RDW 12.8 06/26/2023 0945   RDW 12.7 09/13/2022 1041   LYMPHSABS 2.2 06/26/2023 0945   MONOABS 0.5 06/26/2023 0945   EOSABS 0.0 06/26/2023 0945   BASOSABS 0.0 06/26/2023 0945    No results found for: "POCLITH", "LITHIUM"   No results found for: "PHENYTOIN", "PHENOBARB", "VALPROATE", "CBMZ"   .res Assessment: Plan:    Plan:  PDMP reviewed  Cymbalta  30mg  daily   Risperdal  2mg  at hs Remeron  15mg  at hs  D/C Xanax  0.5mg  TID - anxiety and panic attacks Add Clonazepam  0.5mg  TID - anxiety and panic attacks  RTC 3 months  25 minutes spent dedicated to the care of this patient on the date of this encounter to include pre-visit review of records, ordering of medication, post visit documentation, and face-to-face time with the patient discussing MDD, anxiety, panic attacks and insomnia. Discussed continuing current medication regimen.  Patient advised to  contact office with any questions, adverse effects, or acute worsening in signs and symptoms.  Discussed potential metabolic side effects associated with atypical antipsychotics, as well as potential risk for movement side effects. Advised pt to contact office if movement  side effects occur.   Discussed potential benefits, risk, and side effects of benzodiazepines to include potential risk of tolerance and dependence, as well as possible drowsiness.  Advised patient not to drive if experiencing drowsiness and to take lowest possible effective dose to minimize risk of dependence and tolerance.   Diagnoses and all orders for this visit:  Moderate episode of recurrent major depressive disorder (HCC)  Panic attacks -     clonazePAM  (KLONOPIN ) 0.5 MG tablet; Take 1 tablet (0.5 mg total) by mouth 3 (three) times daily as needed for anxiety.  Generalized anxiety disorder -     clonazePAM  (KLONOPIN ) 0.5 MG tablet; Take 1 tablet (0.5 mg total) by mouth 3 (three) times daily as needed for anxiety.  Insomnia, unspecified type -     clonazePAM  (KLONOPIN ) 0.5 MG tablet; Take 1 tablet (0.5 mg total) by mouth 3 (three) times daily as needed for anxiety.     Please see After Visit Summary for patient specific instructions.  Future Appointments  Date Time Provider Department Center  04/03/2024  1:30 PM Ervin Heath, [place] [facility]    No orders of the defined types were placed in this encounter.   -------------------------------

## 2024-03-06 ENCOUNTER — Telehealth: Payer: Self-pay | Admitting: Adult Health

## 2024-03-06 NOTE — Telephone Encounter (Signed)
 Pt called and said that the klonopin  Gina prescribed is not working. He said that his vision is blurry and that he has tried it for a week and it has not gotten any better. He would like to go back on the xanax . Please give him a call at [phone]

## 2024-03-07 NOTE — Telephone Encounter (Signed)
 Pt wants to know if he needs to wean off the clonazepam . Has been taking 0.5 mg TID routinely since 5/13 and he doesn't want to continue.  Is agreeable to increase in Cymbalta .

## 2024-03-07 NOTE — Telephone Encounter (Signed)
 Patient asking to switch back to Xanax , d/t blurring of vision with clonazepam .  D/C Xanax  0.5mg  TID - anxiety and panic attacks Add Clonazepam  0.5mg  TID - anxiety and panic attacks

## 2024-03-07 NOTE — Telephone Encounter (Signed)
 Called patient and told him Edward Cuevas switched him to clonazepam  d/t complaints that Xanax  was no longer working. He said he can't take the clonazepam  due to SE (said he felt like his head was going to come off) and that is why he was asking to go back to Xanax . I told him that it didn't make sense to switch back to a medication that wasn't working. He is reporting his anxiety and depression has increased. He is open to another medication. Will pend as appropriate.   02/28/2024 02/28/2024 1  Clonazepam  0.5 Mg Tablet 90.00 30 Re Moz [id number] [facility] [phone]) 0/2 3.00 LME Medicare [place] 01/21/2024 12/12/2023 1  Alprazolam  0.5 Mg Tablet 90.00 30

## 2024-03-08 ENCOUNTER — Telehealth: Payer: Self-pay | Admitting: Adult Health

## 2024-03-08 MED ORDER — DULOXETINE HCL 20 MG PO CPEP: 40.0000 mg | ORAL_CAPSULE | Freq: Every day | ORAL | 1 refills | Status: DC

## 2024-03-08 NOTE — Telephone Encounter (Signed)
 He is currently on clonazepam  and wanting to know how to wean off of it. Bonnell Butcher said for him to switch back to Xanax  for now, to give him time for the increased Cymbalta  dose to kick in. He reports he has enough medication for now. Asked him to FU with us  next Wednesday.

## 2024-03-08 NOTE — Telephone Encounter (Signed)
 Pt called at 4:28p stating he was supposed to get a call back from [place] today to tell him how to wean off the Xanax .  Pls call him back.  Next appt 8/20

## 2024-03-08 NOTE — Telephone Encounter (Signed)
 Rx for increased dose of duloxetine  sent.

## 2024-03-08 NOTE — Telephone Encounter (Signed)
 Addressed in a separate message. Patient does not need to wean off clonazepam . Recommended by Bonnell Butcher to switch back to Xanax  to give the increase dose of Cymbalta  to take effect. He reported having enough Xanax  for now.  Asked him to touch base next Wednesday and let us  know he is doing.

## 2024-03-16 DIAGNOSIS — R3914 Feeling of incomplete bladder emptying: Secondary | ICD-10-CM | POA: Diagnosis not present

## 2024-03-27 ENCOUNTER — Other Ambulatory Visit: Payer: Self-pay | Admitting: Physician Assistant

## 2024-03-27 DIAGNOSIS — F331 Major depressive disorder, recurrent, moderate: Secondary | ICD-10-CM

## 2024-03-27 DIAGNOSIS — R251 Tremor, unspecified: Secondary | ICD-10-CM

## 2024-03-27 DIAGNOSIS — F411 Generalized anxiety disorder: Secondary | ICD-10-CM

## 2024-03-28 ENCOUNTER — Other Ambulatory Visit: Payer: Self-pay | Admitting: Adult Health

## 2024-03-28 DIAGNOSIS — G4700 Insomnia, unspecified: Secondary | ICD-10-CM

## 2024-04-02 NOTE — Progress Notes (Unsigned)
 Cardiology Office Note:  .   Date:  04/03/2024  ID:  Edward Cuevas, DOB 01/06/1956, MRN [id number] PCP: Kita Perish  [facility] Providers Cardiologist:  None {  History of Present Illness: .   Edward Cuevas is a 68 y.o. male with history of coronary artery calcifications by CT with negative FFR, hypertension, hyperlipidemia, nicotine dependence, COPD.     Coronary artery calcifications Noted on coronary CT 06/2019.  CAC score 309.  Moderate stenosis in proximal LAD, negative FFR.  Carotid artery stenosis Carotid Dopplers 08/2022 with moderate disease right ICA.  Mild disease left ICA.  Stable from 1 year prior.  Social history  Divorced, has children but they do not live around locally. Not very active.  Mainly stays at home and does housework.      Patient with history of coronary artery calcifications with moderate disease in proximal LAD with negative FFR in 2020.  Patient is here for follow-up, he has no complaints.  Continues to smoke about a pack a day.  He would like to establish care here in [place] instead of [place] now that he lives closer here.  He also has upcoming appointment with his PCP to establish care.  Has long smoking history so he has a CT lung cancer screening coming up.  ROS: Denies: Chest pain, shortness of breath, orthopnea, peripheral edema, palpitations, decreased exercise intolerance, fatigue, lightheadedness, passing out, dizziness.   Studies Reviewed: Aaron Aas    EKG Interpretation Date/Time:  Tuesday April 03 2024 13:28:20 EDT Ventricular Rate:  83 PR Interval:  132 QRS Duration:  76 QT Interval:  370 QTC Calculation: 434 R Axis:   27  Text Interpretation: Normal sinus rhythm Confirmed by Morgan Arab [phone]) on 04/03/2024 1:39:56 PM    Risk Assessment/Calculations:             Physical Exam:   VS:  BP 120/60   Pulse 83   Ht 5' 9 (1.753 m)   Wt 151 lb 9.6 oz (68.8 kg)   SpO2 95%   BMI 22.39 kg/m    Wt Readings from Last 3  Encounters:  04/03/24 151 lb 9.6 oz (68.8 kg)  07/07/23 174 lb 1.3 oz (79 kg)  06/26/23 170 lb (77.1 kg)    GEN: Well nourished, well developed in no acute distress NECK: No JVD; No carotid bruits CARDIAC: RRR, no murmurs, rubs, gallops RESPIRATORY:  Clear to auscultation without rales, wheezing or rhonchi  ABDOMEN: Soft, non-tender, non-distended EXTREMITIES:  No edema; No deformity   ASSESSMENT AND PLAN: .     Coronary artery calcifications Noted on coronary CT 06/2019.  CAC score 309.  Moderate stenosis in proximal LAD, negative FFR.  Overall has stable disease and without any anginal symptoms or anginal equivalents. Continue with aspirin  and rosuvastatin  40 mg  Carotid artery stenosis Carotid Dopplers 08/2022 with moderate disease right ICA.  Mild disease left ICA.  Stable from 1 year prior.  Asymptomatic. Overdue for carotid artery Dopplers, ordering today.  Hypertension Well-controlled and not on any therapy for this currently.  BP 120/60 today.  Hyperlipidemia No recent lipid panel, last lipid panel was in 2023 with LDL 60. Continue with atorvastatin  40 mg.  Will repeat the panel at his convenience when he is fasted.  Nicotine dependence Continues to smoke 1 pack/day.  Counseled on cessation.  He would like to try to quit on his own.  Has lung cancer screening coming up.     Dispo: 1 year follow-up  with Dr. Audery Blazing (Prev [facility]).  He is transitioning care locally here since he moved.  Signed, Burnetta Cart, PA-C

## 2024-04-03 ENCOUNTER — Ambulatory Visit: Attending: Physician Assistant | Admitting: Cardiology

## 2024-04-03 ENCOUNTER — Encounter: Payer: Self-pay | Admitting: Physician Assistant

## 2024-04-03 VITALS — BP 120/60 | HR 83 | Ht 69.0 in | Wt 151.6 lb

## 2024-04-03 DIAGNOSIS — I2510 Atherosclerotic heart disease of native coronary artery without angina pectoris: Secondary | ICD-10-CM

## 2024-04-03 DIAGNOSIS — I6521 Occlusion and stenosis of right carotid artery: Secondary | ICD-10-CM | POA: Diagnosis not present

## 2024-04-03 DIAGNOSIS — I2584 Coronary atherosclerosis due to calcified coronary lesion: Secondary | ICD-10-CM | POA: Diagnosis not present

## 2024-04-03 DIAGNOSIS — I700 Atherosclerosis of aorta: Secondary | ICD-10-CM | POA: Diagnosis not present

## 2024-04-03 DIAGNOSIS — F17200 Nicotine dependence, unspecified, uncomplicated: Secondary | ICD-10-CM | POA: Diagnosis not present

## 2024-04-03 DIAGNOSIS — E782 Mixed hyperlipidemia: Secondary | ICD-10-CM | POA: Diagnosis not present

## 2024-04-03 DIAGNOSIS — I10 Essential (primary) hypertension: Secondary | ICD-10-CM

## 2024-04-03 NOTE — Patient Instructions (Signed)
 Medication Instructions:  NO CHANGES *If you need a refill on your cardiac medications before your next appointment, please call your pharmacy*  Lab Work: FASTING LIPID PANEL WITHIN 1-2 WEEKS If you have labs (blood work) drawn today and your tests are completely normal, you will receive your results only by: MyChart Message (if you have MyChart) OR A paper copy in the mail If you have any lab test that is abnormal or we need to change your treatment, we will call you to review the results.  Testing/Procedures:[address] Your physician has requested that you have a carotid duplex. This test is an ultrasound of the carotid arteries in your neck. It looks at blood flow through these arteries that supply the brain with blood. Allow one hour for this exam. There are no restrictions or special instructions.   Follow-Up: At [facility], you and your health needs are our priority.  As part of our continuing mission to provide you with exceptional heart care, our providers are all part of one team.  This team includes your primary Cardiologist (physician) and Advanced Practice Providers or APPs (Physician Assistants and Nurse Practitioners) who all work together to provide you with the care you need, when you need it.  Your next appointment:   1 year(s)  Provider:   Alexandria Angel, MD

## 2024-04-13 ENCOUNTER — Ambulatory Visit (HOSPITAL_COMMUNITY)
Admission: RE | Admit: 2024-04-13 | Discharge: 2024-04-13 | Disposition: A | Discharge status: Home / Self Care | Source: Ambulatory Visit | Attending: Cardiology | Admitting: Cardiology

## 2024-04-13 DIAGNOSIS — I6521 Occlusion and stenosis of right carotid artery: Secondary | ICD-10-CM

## 2024-04-13 DIAGNOSIS — I10 Essential (primary) hypertension: Secondary | ICD-10-CM

## 2024-04-13 DIAGNOSIS — E782 Mixed hyperlipidemia: Secondary | ICD-10-CM | POA: Diagnosis not present

## 2024-04-13 DIAGNOSIS — F17200 Nicotine dependence, unspecified, uncomplicated: Secondary | ICD-10-CM

## 2024-04-13 DIAGNOSIS — I700 Atherosclerosis of aorta: Secondary | ICD-10-CM | POA: Diagnosis not present

## 2024-04-13 DIAGNOSIS — I2510 Atherosclerotic heart disease of native coronary artery without angina pectoris: Secondary | ICD-10-CM | POA: Diagnosis not present

## 2024-04-13 DIAGNOSIS — I2584 Coronary atherosclerosis due to calcified coronary lesion: Secondary | ICD-10-CM | POA: Diagnosis not present

## 2024-04-14 LAB — LIPID PANEL
Chol/HDL Ratio: 2.5 ratio (ref 0.0–5.0)
Cholesterol, Total: 116 mg/dL (ref 100–199)
HDL: 47 mg/dL (ref >39)
LDL Chol Calc (NIH): 56 mg/dL (ref 0–99)
Triglycerides: 56 mg/dL (ref 0–149)
VLDL Cholesterol Cal: 13 mg/dL (ref 5–40)

## 2024-04-16 ENCOUNTER — Other Ambulatory Visit: Payer: Self-pay | Admitting: Family Medicine

## 2024-04-16 ENCOUNTER — Ambulatory Visit: Payer: Self-pay | Admitting: Cardiology

## 2024-04-16 DIAGNOSIS — E782 Mixed hyperlipidemia: Secondary | ICD-10-CM | POA: Diagnosis not present

## 2024-04-16 DIAGNOSIS — F17200 Nicotine dependence, unspecified, uncomplicated: Secondary | ICD-10-CM | POA: Diagnosis not present

## 2024-04-16 DIAGNOSIS — I6521 Occlusion and stenosis of right carotid artery: Secondary | ICD-10-CM

## 2024-04-16 DIAGNOSIS — N3281 Overactive bladder: Secondary | ICD-10-CM

## 2024-04-16 DIAGNOSIS — J449 Chronic obstructive pulmonary disease, unspecified: Secondary | ICD-10-CM | POA: Diagnosis not present

## 2024-04-16 MED ORDER — MYRBETRIQ 50 MG PO TB24: 50.0000 mg | ORAL_TABLET | Freq: Every day | ORAL | 0 refills | Status: AC

## 2024-04-16 NOTE — Telephone Encounter (Signed)
-----   Message from Thom LITTIE Sluder sent at 04/16/2024  9:01 AM EDT ----- sorry I did not realize he had another study.  Carotid Dopplers have been stable, no change.  Continue annual Dopplers. ----- Message ----- From: Interface, Three One Seven Sent: 04/13/2024  12:09 PM EDT To: Thom LITTIE Sluder, PA-C

## 2024-04-16 NOTE — Telephone Encounter (Signed)
 The patient has been notified of the result and verbalized understanding.  All questions (if any) were answered.  Pt aware we will repeat another carotid US  on him in one year for surveillance.   Pt aware we will place the order for this in the system and send a message to our PV Scheduler to call him closer to that time to arrange this appt.   Pt verbalized understanding and agrees with this plan.

## 2024-04-19 ENCOUNTER — Other Ambulatory Visit: Payer: Self-pay | Admitting: Adult Health

## 2024-04-27 ENCOUNTER — Ambulatory Visit
Admission: RE | Admit: 2024-04-27 | Discharge: 2024-04-27 | Disposition: A | Discharge status: Home / Self Care | Source: Ambulatory Visit | Attending: Acute Care | Admitting: Acute Care

## 2024-04-27 DIAGNOSIS — F17210 Nicotine dependence, cigarettes, uncomplicated: Secondary | ICD-10-CM | POA: Diagnosis not present

## 2024-04-27 DIAGNOSIS — Z122 Encounter for screening for malignant neoplasm of respiratory organs: Secondary | ICD-10-CM | POA: Diagnosis not present

## 2024-04-27 DIAGNOSIS — Z87891 Personal history of nicotine dependence: Secondary | ICD-10-CM

## 2024-05-07 ENCOUNTER — Other Ambulatory Visit: Payer: Self-pay

## 2024-05-07 DIAGNOSIS — Z87891 Personal history of nicotine dependence: Secondary | ICD-10-CM

## 2024-05-07 DIAGNOSIS — F17210 Nicotine dependence, cigarettes, uncomplicated: Secondary | ICD-10-CM

## 2024-05-07 DIAGNOSIS — Z122 Encounter for screening for malignant neoplasm of respiratory organs: Secondary | ICD-10-CM

## 2024-05-16 ENCOUNTER — Other Ambulatory Visit: Payer: Self-pay | Admitting: Cardiology

## 2024-05-22 DIAGNOSIS — R3914 Feeling of incomplete bladder emptying: Secondary | ICD-10-CM | POA: Diagnosis not present

## 2024-05-28 ENCOUNTER — Telehealth: Payer: Self-pay | Admitting: Adult Health

## 2024-05-28 ENCOUNTER — Other Ambulatory Visit: Payer: Self-pay

## 2024-05-28 DIAGNOSIS — F410 Panic disorder [episodic paroxysmal anxiety] without agoraphobia: Secondary | ICD-10-CM

## 2024-05-28 DIAGNOSIS — G4700 Insomnia, unspecified: Secondary | ICD-10-CM

## 2024-05-28 DIAGNOSIS — F411 Generalized anxiety disorder: Secondary | ICD-10-CM

## 2024-05-28 MED ORDER — CLONAZEPAM 0.5 MG PO TABS: 0.5000 mg | ORAL_TABLET | Freq: Three times a day (TID) | ORAL | 0 refills | Status: DC | PRN

## 2024-05-28 NOTE — Telephone Encounter (Signed)
 Pt called and said that he is out of xanax  and needs abother script sent in to get him to 08/20  his next appt. Pharmacy is [facility] on [address]

## 2024-05-28 NOTE — Telephone Encounter (Signed)
 Pended

## 2024-05-29 ENCOUNTER — Other Ambulatory Visit: Payer: Self-pay

## 2024-05-29 ENCOUNTER — Telehealth: Payer: Self-pay | Admitting: Adult Health

## 2024-05-29 DIAGNOSIS — F410 Panic disorder [episodic paroxysmal anxiety] without agoraphobia: Secondary | ICD-10-CM

## 2024-05-29 MED ORDER — ALPRAZOLAM 0.5 MG PO TABS: 0.5000 mg | ORAL_TABLET | Freq: Three times a day (TID) | ORAL | 0 refills | Status: DC | PRN

## 2024-05-29 NOTE — Telephone Encounter (Signed)
 In ref to msg from yesterday pt needs refill on Xanax  and refill was put in on Clonazepam  which he can't take.    [facility] [id number]

## 2024-06-06 ENCOUNTER — Ambulatory Visit: Admitting: Adult Health

## 2024-06-06 ENCOUNTER — Encounter: Payer: Self-pay | Admitting: Adult Health

## 2024-06-06 DIAGNOSIS — F411 Generalized anxiety disorder: Secondary | ICD-10-CM | POA: Diagnosis not present

## 2024-06-06 DIAGNOSIS — F410 Panic disorder [episodic paroxysmal anxiety] without agoraphobia: Secondary | ICD-10-CM | POA: Diagnosis not present

## 2024-06-06 DIAGNOSIS — G4700 Insomnia, unspecified: Secondary | ICD-10-CM | POA: Diagnosis not present

## 2024-06-06 MED ORDER — DIAZEPAM 5 MG PO TABS
5.0000 mg | ORAL_TABLET | Freq: Three times a day (TID) | ORAL | 0 refills | Status: DC | PRN
Start: 2024-06-06 — End: 2024-07-09

## 2024-06-06 NOTE — Progress Notes (Signed)
 Edward Cuevas [id number] 10-05-1956 68 y.o.  Subjective:   Patient ID:  Edward Cuevas is a 68 y.o. (DOB 1956-05-08) male.  Chief Complaint: No chief complaint on file.   HPI Edward Cuevas presents to the office today for follow-up of MDD, anxiety, panic attacks, and insomnia.  Describes mood today as ok. Pleasant. Mood symptoms - reports increased anxiety and depression. Reports some irritability with taking Xanax . Reports decreased interest and motivation. Denies recent panic attacks. Reports worry, rumination and over thinking - I do that a lot. Reports mood is stable. Stating I feel like I'm doing pretty good. Does not feel the Xanax  is helpful to manage mood symptoms and would like to try the Valium .  Reports taking medications as prescribed.  Energy levels lower. Active, does not have a regular exercise routine with physical disabilities.  Enjoys some usual interests and activities. Divorced. Lives with ex wife. Has 2 grown sons. Has a sister local. Spending time with family. Appetite adequate. Weight loss - 150 pounds. Sleeps well most nights. Averages 8 hours. Denies daytime napping.  Reports focus and concentration difficulties. Completing tasks. Managing aspects of household. Receiving disability benefits/retirement. Retired Animal nutritionist.  Denies SI or HI.  Denies AH or VH. Denies self harm. Denies substance use. Consumes 2 beers a day.  Previous medication trials: Cymbalta , Wellbutrin     AUDIT    Flowsheet Row Office Visit from 03/24/2021 in [facility] at [facility]  Alcohol Use Disorder Identification Test Final Score (AUDIT) 9   GAD-7    Flowsheet Row Office Visit from 07/14/2020 in [facility] at [facility] Office Visit from 01/09/2020 in [facility] Care [facility] Medicine at [facility] Office Visit from 11/28/2019 in [facility] Primary [facility] at [facility] Office Visit from 07/11/2019 in [facility] Primary Care & Sports Medicine at [facility] Office Visit from 05/16/2019 in [facility] Primary [facility] Medicine at [facility]  Total GAD-7 Score 0 6 3 0 1   PHQ2-9    Flowsheet Row Office Visit from 10/25/2022 in [facility] Primary Care & Sports Medicine at [facility] Office Visit from 10/13/2021 in [facility] Dis - A Dept Of [place] Office Visit from 06/29/2021 in [facility] Infect Dis - A Dept Of [place] Office Visit from 03/24/2021 in [facility] Primary Care & Sports Medicine at [facility] Office Visit from 07/14/2020 in [facility] Primary Care & Sports Medicine at [facility] Total Score 2 0 0 0 0  PHQ-9 Total Score 4 -- -- 0 4   Flowsheet Row ED from 06/26/2023 in [facility] Emergency Department at [facility]  C-SSRS RISK CATEGORY No Risk     Review of Systems:  Review of Systems  Musculoskeletal:  Negative for gait problem.  Neurological:  Negative for tremors.  Psychiatric/Behavioral:         Please refer to HPI    Medications: I have reviewed the patient's current medications.  Current Outpatient Medications  Medication Sig Dispense Refill   diazepam  (VALIUM ) 5 MG tablet Take 1 tablet (5 mg total) by mouth 3 (three) times daily as needed for anxiety. 90 tablet 0   acetaminophen  (TYLENOL ) 650 MG CR tablet Take 650 mg by mouth 2 (two) times daily.     aspirin  EC 81 MG tablet Take 1 tablet (  81 mg total) by mouth daily. 90 tablet 3   cyclobenzaprine  (FLEXERIL ) 10 MG tablet Take 1 tablet (10 mg total) by mouth 3 (three) times daily as needed for muscle spasms. 30 tablet 0   DULoxetine  (CYMBALTA ) 20 MG capsule TAKE 2 CAPSULES(40 MG) BY MOUTH DAILY 60 capsule 1   Fluticasone -Umeclidin-Vilant (TRELEGY ELLIPTA ) 100-62.5-25 MCG/ACT AEPB Inhale 1 puff into the lungs daily. 180 each 1    hydrocortisone  (ANUSOL -HC) 2.5 % rectal cream APPLY RECTALLY TO THE AFFECTED AREA TWICE DAILY 60 g 1   imiquimod  (ALDARA ) 5 % cream Apply 1 Application topically at bedtime.     mirtazapine  (REMERON ) 15 MG tablet TAKE 1 TABLET(15 MG) BY MOUTH AT BEDTIME 30 tablet 2   MYRBETRIQ  50 MG TB24 tablet Take 1 tablet (50 mg total) by mouth daily. 90 tablet 0   nitroGLYCERIN  (NITROSTAT ) 0.4 MG SL tablet Place 0.4 mg under the tongue every 5 (five) minutes as needed for chest pain.     risperiDONE  (RISPERDAL ) 2 MG tablet Take 1 tablet (2 mg total) by mouth at bedtime. (Patient not taking: Reported on 04/03/2024) 30 tablet 2   rosuvastatin  (CRESTOR ) 40 MG tablet TAKE 1 TABLET(40 MG) BY MOUTH DAILY 90 tablet 3   tamsulosin  (FLOMAX ) 0.4 MG CAPS capsule Take 1 capsule (0.4 mg total) by mouth daily. (Patient not taking: Reported on 04/03/2024) 90 capsule 3   No current facility-administered medications for this visit.    Medication Side Effects: None  Allergies:  Allergies  Allergen Reactions   Abilify  [Aripiprazole ]     Feel weird.    Penicillins Itching and Rash    Has patient had a PCN reaction causing immediate rash, facial/tongue/throat swelling, SOB or lightheadedness with hypotension: Yes Has patient had a PCN reaction causing severe rash involving mucus membranes or skin necrosis: No Has patient had a PCN reaction that required hospitalization No Has patient had a PCN reaction occurring within the last 10 years: No If all of the above answers are NO, then may proceed with Cephalosporin use.     Past Medical History:  Diagnosis Date   Abdominal distension (gaseous) 04/11/2019   Actinic keratosis 06/27/2015   Adrenal adenoma 04/17/2019   Bilateral CT abd 03/2019    Anxiety    Aortic atherosclerosis (HCC) 05/16/2019   Arthritis    BPH without obstruction/lower urinary tract symptoms 11/25/2015   Cancer (HCC)    skin   Carotid stenosis, right 04/17/2019   50-69 percent narrowed. Doppler  03/2019.    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   Coronary artery disease due to calcified coronary lesion 05/16/2019   Current smoker 04/09/2019   DDD (degenerative disc disease), lumbosacral 10/23/2013   opiod risk 1, low risk.  Failed UDS 05/2017 positive for marijuanna/alcohol.    Depression    DJD 10/23/2013   Dyspnea on exertion 05/31/2019   Fatty liver 10/01/2016   Via U/S 09/2016.    GAD (generalized anxiety disorder) 11/01/2014   Generalized anxiety disorder 10/26/2022   Hepatitis    A,B,C   History of hepatitis C 10/08/2014   Per pt cured.   Hyperlipidemia    Hypertension    Insomnia 10/26/2022   Lumbar radiculopathy 01/18/2017  Mixed hyperlipidemia    Moderate episode of recurrent major depressive disorder (HCC) 05/16/2019   OAB (overactive bladder) 07/12/2019   Panic attacks 10/04/2014   Positive colorectal cancer screening using Cologuard test 03/10/2021   Pre-diabetes 10/26/2022   Prediabetes    Pulmonary nodules 05/16/2019   Right carotid bruit 04/09/2019   Right inguinal hernia 10/08/2014   Right lower quadrant abdominal pain 04/09/2019   Shakiness 07/01/2023   Vitamin D  deficiency     Past Medical History, Surgical history, Social history, and Family history were reviewed and updated as appropriate.   Please see review of systems for further details on the patient's review from today.   Objective:   Physical Exam:  There were no vitals taken for this visit.  Physical Exam Constitutional:      General: He is not in acute distress. Musculoskeletal:        General: No deformity.  Neurological:     Mental Status: He is alert and oriented to person,  place, and time.     Coordination: Coordination normal.  Psychiatric:        Attention and Perception: Attention and perception normal. He does not perceive auditory or visual hallucinations.        Mood and Affect: Mood normal. Mood is not anxious or depressed. Affect is not labile, blunt, angry or inappropriate.        Speech: Speech normal.        Behavior: Behavior normal.        Thought Content: Thought content normal. Thought content is not paranoid or delusional. Thought content does not include homicidal or suicidal ideation. Thought content does not include homicidal or suicidal plan.        Cognition and Memory: Cognition and memory normal.        Judgment: Judgment normal.     Comments: Insight intact     Lab Review:     Component Value Date/Time   NA 136 06/26/2023 0945   NA 137 09/13/2022 1041   K 4.5 06/26/2023 0945   CL 98 06/26/2023 0945   CO2 31 06/26/2023 0945   GLUCOSE 103 (H) 06/26/2023 0945   BUN 8 06/26/2023 0945   BUN 9 09/13/2022 1041   CREATININE 0.89 06/26/2023 0945   CREATININE 0.90 04/27/2021 1355   CALCIUM  9.2 06/26/2023 0945   PROT 7.0 06/26/2023 0945   PROT 6.9 09/13/2022 1041   ALBUMIN 4.5 06/26/2023 0945   ALBUMIN 4.5 09/13/2022 1041   AST 21 06/26/2023 0945   ALT 14 06/26/2023 0945   ALT 77 (H) 03/09/2021 1421   ALKPHOS 36 (L) 06/26/2023 0945   BILITOT 0.5 06/26/2023 0945   BILITOT 0.4 09/13/2022 1041   GFRNONAA >60 06/26/2023 0945   GFRNONAA 83 02/25/2021 1340   GFRAA 96 02/25/2021 1340       Component Value Date/Time   WBC 5.8 06/26/2023 0945   RBC 6.11 (H) 06/26/2023 0945   HGB 19.1 (H) 06/26/2023 0945   HGB 18.0 (H) 09/13/2022 1041   HCT 55.1 (H) 06/26/2023 0945   HCT 52.3 (H) 09/13/2022 1041   PLT 152 06/26/2023 0945   PLT 177 09/13/2022 1041   MCV 90.2 06/26/2023 0945   MCV 90 09/13/2022 1041   MCH 31.3 06/26/2023 0945   MCHC 34.7 06/26/2023 0945   RDW 12.8 06/26/2023 0945   RDW 12.7 09/13/2022 1041   LYMPHSABS 2.2  06/26/2023 0945   MONOABS 0.5 06/26/2023 0945   EOSABS 0.0 06/26/2023 0945   BASOSABS  0.0 06/26/2023 0945    No results found for: POCLITH, LITHIUM   No results found for: PHENYTOIN, PHENOBARB, VALPROATE, CBMZ   .res Assessment: Plan:    Plan:  PDMP reviewed  Cymbalta  30mg  daily   Risperdal  2mg  at hs Remeron  15mg  at hs  D/C Xanax  0.5mg  TID - anxiety and panic attacks Add Valium  5mg  TID    RTC 3 months  25 minutes spent dedicated to the care of this patient on the date of this encounter to include pre-visit review of records, ordering of medication, post visit documentation, and face-to-face time with the patient discussing MDD, anxiety, panic attacks and insomnia. Discussed continuing current medication regimen.  Patient advised to contact office with any questions, adverse effects, or acute worsening in signs and symptoms.  Discussed potential metabolic side effects associated with atypical antipsychotics, as well as potential risk for movement side effects. Advised pt to contact office if movement side effects occur.   Discussed potential benefits, risk, and side effects of benzodiazepines to include potential risk of tolerance and dependence, as well as possible drowsiness.  Advised patient not to drive if experiencing drowsiness and to take lowest possible effective dose to minimize risk of dependence and tolerance.   Diagnoses and all orders for this visit:  Generalized anxiety disorder -     diazepam  (VALIUM ) 5 MG tablet; Take 1 tablet (5 mg total) by mouth 3 (three) times daily as needed for anxiety.  Panic attacks  Insomnia, unspecified type     Please see After Visit Summary for patient specific instructions.  Future Appointments  Date Time Provider Department Center  07/09/2024 11:00 AM Maylyn Narvaiz Nattalie, NP [facility] None    No orders of the defined types were placed in this encounter.   -------------------------------

## 2024-06-21 ENCOUNTER — Telehealth: Payer: Self-pay | Admitting: Adult Health

## 2024-06-21 NOTE — Telephone Encounter (Signed)
 Next visit is 07/09/24. Edward Cuevas called and wanted you to know that his Diazepam  is doing wonderful for him and wanted to thank you very much!!!!!!

## 2024-06-26 ENCOUNTER — Other Ambulatory Visit: Payer: Self-pay

## 2024-06-26 DIAGNOSIS — G4700 Insomnia, unspecified: Secondary | ICD-10-CM

## 2024-06-26 MED ORDER — MIRTAZAPINE 15 MG PO TABS: 15.0000 mg | ORAL_TABLET | Freq: Every day | ORAL | 0 refills | Status: DC

## 2024-07-09 ENCOUNTER — Encounter: Payer: Self-pay | Admitting: Adult Health

## 2024-07-09 ENCOUNTER — Other Ambulatory Visit: Payer: Self-pay | Admitting: Physician Assistant

## 2024-07-09 ENCOUNTER — Ambulatory Visit: Admitting: Adult Health

## 2024-07-09 DIAGNOSIS — F331 Major depressive disorder, recurrent, moderate: Secondary | ICD-10-CM

## 2024-07-09 DIAGNOSIS — G4700 Insomnia, unspecified: Secondary | ICD-10-CM

## 2024-07-09 DIAGNOSIS — F419 Anxiety disorder, unspecified: Secondary | ICD-10-CM | POA: Diagnosis not present

## 2024-07-09 DIAGNOSIS — F411 Generalized anxiety disorder: Secondary | ICD-10-CM

## 2024-07-09 DIAGNOSIS — G478 Other sleep disorders: Secondary | ICD-10-CM

## 2024-07-09 DIAGNOSIS — F410 Panic disorder [episodic paroxysmal anxiety] without agoraphobia: Secondary | ICD-10-CM

## 2024-07-09 MED ORDER — DIAZEPAM 5 MG PO TABS: 5.0000 mg | ORAL_TABLET | Freq: Three times a day (TID) | ORAL | 2 refills | Status: AC | PRN

## 2024-07-09 MED ORDER — MIRTAZAPINE 15 MG PO TABS: 15.0000 mg | ORAL_TABLET | Freq: Every day | ORAL | 2 refills | Status: AC

## 2024-07-09 MED ORDER — RISPERIDONE 2 MG PO TABS: 2.0000 mg | ORAL_TABLET | Freq: Every day | ORAL | 2 refills | Status: AC

## 2024-07-09 NOTE — Progress Notes (Signed)
 Edward Cuevas [id number] 05-24-1956 68 y.o.  Virtual Visit via Telephone Note  I connected with pt on 07/09/24 at 11:00 AM EDT by telephone and verified that I am speaking with the correct person using two identifiers.   I discussed the limitations, risks, security and privacy concerns of performing an evaluation and management service by telephone and the availability of in person appointments. I also discussed with the patient that there may be a patient responsible charge related to this service. The patient expressed understanding and agreed to proceed.   I discussed the assessment and treatment plan with the patient. The patient was provided an opportunity to ask questions and all were answered. The patient agreed with the plan and demonstrated an understanding of the instructions.   The patient was advised to call back or seek an in-person evaluation if the symptoms worsen or if the condition fails to improve as anticipated.  I provided 25 minutes of non-face-to-face time during this encounter.  The patient was located at home.  The provider was located at [facility].   Angeline LOISE Sayers, NP   Subjective:   Patient ID:  Edward Cuevas is a 68 y.o. (DOB 1956-03-28) male.  Chief Complaint: No chief complaint on file.   HPI Edward Cuevas presents for follow-up of MDD, anxiety, panic attacks, and insomnia.  Describes mood today as ok. Pleasant. Mood symptoms - denies anxiety irritability and depression. Reports decreased interest and motivation. Denies recent panic attacks. Reports some worry, rumination and over thinking - I worry about a lot of things, it's not bad. Reports mood is stable. Stating I feel like I'm doing better. Feels like current medication regimen works well. Reports taking medications as prescribed.  Energy levels lower - doing what I have to do. Active, does not have a regular exercise routine with physical disabilities.  Enjoys some usual interests and  activities. Divorced. Lives with ex wife. Has 2 grown sons. Has a sister local. Spending time with family. Appetite adequate. Weight loss - 150 pounds. Sleeps well most nights. Averages 8 to 9 hours. Denies daytime napping.  Reports focus and concentration pretty good. Completing tasks. Managing aspects of household. Receiving disability benefits/retirement. Retired Animal nutritionist.  Denies SI or HI.  Denies AH or VH. Denies self harm. Denies substance use. Consumes 2 beers a day.  Previous medication trials: Cymbalta , Wellbutrin     Review of Systems:  Review of Systems  Musculoskeletal:  Negative for gait problem.  Neurological:  Negative for tremors.  Psychiatric/Behavioral:         Please refer to HPI    Medications: I have reviewed the patient's current medications.  Current Outpatient Medications  Medication Sig Dispense Refill   acetaminophen  (TYLENOL ) 650 MG CR tablet Take 650 mg by mouth 2 (two) times daily.     aspirin  EC 81 MG tablet Take 1 tablet (81 mg total) by mouth daily. 90 tablet 3   cyclobenzaprine  (FLEXERIL ) 10 MG tablet Take 1 tablet (10 mg total) by mouth 3 (three) times daily as needed for muscle spasms. 30 tablet 0   diazepam  (VALIUM ) 5 MG tablet Take 1 tablet (5 mg total) by mouth 3 (three) times daily as needed for anxiety. 90 tablet 0   DULoxetine  (CYMBALTA ) 20 MG capsule TAKE 2 CAPSULES(40 MG) BY MOUTH DAILY 60 capsule 1   Fluticasone -Umeclidin-Vilant (TRELEGY ELLIPTA ) 100-62.5-25 MCG/ACT AEPB Inhale 1 puff into the lungs daily. 180 each 1   hydrocortisone  (ANUSOL -HC) 2.5 % rectal cream APPLY RECTALLY TO THE  AFFECTED AREA TWICE DAILY 60 g 1   imiquimod  (ALDARA ) 5 % cream Apply 1 Application topically at bedtime.     mirtazapine  (REMERON ) 15 MG tablet Take 1 tablet (15 mg total) by mouth at bedtime. 30 tablet 0   MYRBETRIQ  50 MG TB24 tablet Take 1 tablet (50 mg total) by mouth daily. 90 tablet 0   nitroGLYCERIN  (NITROSTAT ) 0.4 MG SL tablet Place 0.4 mg under  the tongue every 5 (five) minutes as needed for chest pain.     risperiDONE  (RISPERDAL ) 2 MG tablet Take 1 tablet (2 mg total) by mouth at bedtime. (Patient not taking: Reported on 04/03/2024) 30 tablet 2   rosuvastatin  (CRESTOR ) 40 MG tablet TAKE 1 TABLET(40 MG) BY MOUTH DAILY 90 tablet 3   tamsulosin  (FLOMAX ) 0.4 MG CAPS capsule Take 1 capsule (0.4 mg total) by mouth daily. (Patient not taking: Reported on 04/03/2024) 90 capsule 3   No current facility-administered medications for this visit.    Medication Side Effects: None  Allergies:  Allergies  Allergen Reactions   Abilify  [Aripiprazole ]     Feel weird.    Penicillins Itching and Rash    Has patient had a PCN reaction causing immediate rash, facial/tongue/throat swelling, SOB or lightheadedness with hypotension: Yes Has patient had a PCN reaction causing severe rash involving mucus membranes or skin necrosis: No Has patient had a PCN reaction that required hospitalization No Has patient had a PCN reaction occurring within the last 10 years: No If all of the above answers are NO, then may proceed with Cephalosporin use.     Past Medical History:  Diagnosis Date   Abdominal distension (gaseous) 04/11/2019   Actinic keratosis 06/27/2015   Adrenal adenoma 04/17/2019   Bilateral CT abd 03/2019    Anxiety    Aortic atherosclerosis (HCC) 05/16/2019   Arthritis    BPH without obstruction/lower urinary tract symptoms 11/25/2015   Cancer (HCC)    skin   Carotid stenosis, right 04/17/2019   50-69 percent narrowed. Doppler 03/2019.    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   Coronary artery disease due to calcified coronary lesion  05/16/2019   Current smoker 04/09/2019   DDD (degenerative disc disease), lumbosacral 10/23/2013   opiod risk 1, low risk.  Failed UDS 05/2017 positive for marijuanna/alcohol.    Depression    DJD 10/23/2013   Dyspnea on exertion 05/31/2019   Fatty liver 10/01/2016   Via U/S 09/2016.    GAD (generalized anxiety disorder) 11/01/2014   Generalized anxiety disorder 10/26/2022   Hepatitis    A,B,C   History of hepatitis C 10/08/2014   Per pt cured.   Hyperlipidemia    Hypertension    Insomnia 10/26/2022   Lumbar radiculopathy 01/18/2017   Mixed hyperlipidemia    Moderate episode of recurrent major depressive disorder (HCC) 05/16/2019   OAB (overactive bladder) 07/12/2019   Panic attacks 10/04/2014   Positive colorectal cancer screening using Cologuard test 03/10/2021   Pre-diabetes 10/26/2022   Prediabetes    Pulmonary nodules 05/16/2019   Right carotid bruit 04/09/2019   Right inguinal hernia 10/08/2014   Right lower quadrant abdominal pain 04/09/2019   Shakiness 07/01/2023   Vitamin D  deficiency  Family History  Problem Relation Age of Onset   Diabetes Mother    Heart disease Father    Heart attack Father    Heart disease Maternal Grandmother    Colon cancer Neg Hx    Colon polyps Neg Hx    Esophageal cancer Neg Hx    Stomach cancer Neg Hx    Rectal cancer Neg Hx     Social History   Socioeconomic History   Marital status: Divorced    Spouse name: Not on file   Number of children: 2   Years of education: 11   Highest education level: 11th grade  Occupational History   Occupation: superintendant    Comment: retired  Tobacco Use   Smoking status: Every Day    Current packs/day: 1.00    Average packs/day: 1 pack/day for 30.0 years (30.0 ttl pk-yrs)    Types: Cigarettes   Smokeless tobacco: Never  Vaping Use   Vaping status: Some Days  Substance and Sexual Activity   Alcohol use: Yes    Alcohol/week: 4.0 standard drinks of alcohol    Types: 4 Cans of  beer per week    Comment: 4 beers daily   Drug use: No   Sexual activity: Not Currently  Other Topics Concern   Not on file  Social History Narrative   Lives with his ex-wife. He enjoys watching television.   Social Drivers of Corporate investment banker Strain: Low Risk  (03/24/2021)   Overall Financial Resource Strain (CARDIA)    Difficulty of Paying Living Expenses: Not hard at all  Food Insecurity: No Food Insecurity (03/24/2021)   Hunger Vital Sign    Worried About Running Out of Food in the Last Year: Never true    Ran Out of Food in the Last Year: Never true  Transportation Needs: No Transportation Needs (03/24/2021)   PRAPARE - Administrator, Civil Service (Medical): No    Lack of Transportation (Non-Medical): No  Physical Activity: Inactive (03/24/2021)   Exercise Vital Sign    Days of Exercise per Week: 0 days    Minutes of Exercise per Session: 0 min  Stress: No Stress Concern Present (03/24/2021)   [employer] of Occupational Health - Occupational Stress Questionnaire    Feeling of Stress : Not at all  Social Connections: Socially Isolated (03/24/2021)   Social Connection and Isolation Panel    Frequency of Communication with Friends and Family: More than three times a week    Frequency of Social Gatherings with Friends and Family: Never    Attends Religious Services: Never    Database administrator or Organizations: No    Attends Banker Meetings: Never    Marital Status: Divorced  Catering manager Violence: Not At Risk (03/24/2021)   Humiliation, Afraid, Rape, and Kick questionnaire    Fear of Current or Ex-Partner: No    Emotionally Abused: No    Physically Abused: No    Sexually Abused: No    Past Medical History, Surgical history, Social history, and Family history were reviewed and updated as appropriate.   Please see review of systems for further details on the patient's review from today.   Objective:   Physical Exam:  There  were no vitals taken for this visit.  Physical Exam Constitutional:      General: He is not in acute distress. Musculoskeletal:        General: No deformity.  Neurological:     Mental  Status: He is alert and oriented to person, place, and time.     Coordination: Coordination normal.  Psychiatric:        Attention and Perception: Attention and perception normal. He does not perceive auditory or visual hallucinations.        Mood and Affect: Mood normal. Mood is not anxious or depressed. Affect is not labile, blunt, angry or inappropriate.        Speech: Speech normal.        Behavior: Behavior normal.        Thought Content: Thought content normal. Thought content is not paranoid or delusional. Thought content does not include homicidal or suicidal ideation. Thought content does not include homicidal or suicidal plan.        Cognition and Memory: Cognition and memory normal.        Judgment: Judgment normal.     Comments: Insight intact     Lab Review:     Component Value Date/Time   NA 136 06/26/2023 0945   NA 137 09/13/2022 1041   K 4.5 06/26/2023 0945   CL 98 06/26/2023 0945   CO2 31 06/26/2023 0945   GLUCOSE 103 (H) 06/26/2023 0945   BUN 8 06/26/2023 0945   BUN 9 09/13/2022 1041   CREATININE 0.89 06/26/2023 0945   CREATININE 0.90 04/27/2021 1355   CALCIUM  9.2 06/26/2023 0945   PROT 7.0 06/26/2023 0945   PROT 6.9 09/13/2022 1041   ALBUMIN 4.5 06/26/2023 0945   ALBUMIN 4.5 09/13/2022 1041   AST 21 06/26/2023 0945   ALT 14 06/26/2023 0945   ALT 77 (H) 03/09/2021 1421   ALKPHOS 36 (L) 06/26/2023 0945   BILITOT 0.5 06/26/2023 0945   BILITOT 0.4 09/13/2022 1041   GFRNONAA >60 06/26/2023 0945   GFRNONAA 83 02/25/2021 1340   GFRAA 96 02/25/2021 1340       Component Value Date/Time   WBC 5.8 06/26/2023 0945   RBC 6.11 (H) 06/26/2023 0945   HGB 19.1 (H) 06/26/2023 0945   HGB 18.0 (H) 09/13/2022 1041   HCT 55.1 (H) 06/26/2023 0945   HCT 52.3 (H) 09/13/2022 1041    PLT 152 06/26/2023 0945   PLT 177 09/13/2022 1041   MCV 90.2 06/26/2023 0945   MCV 90 09/13/2022 1041   MCH 31.3 06/26/2023 0945   MCHC 34.7 06/26/2023 0945   RDW 12.8 06/26/2023 0945   RDW 12.7 09/13/2022 1041   LYMPHSABS 2.2 06/26/2023 0945   MONOABS 0.5 06/26/2023 0945   EOSABS 0.0 06/26/2023 0945   BASOSABS 0.0 06/26/2023 0945    No results found for: POCLITH, LITHIUM   No results found for: PHENYTOIN, PHENOBARB, VALPROATE, CBMZ   .res Assessment: Plan:    Plan:  PDMP reviewed  D/C Cymbalta  30mg  daily  - does not feel like he needs it. Risperdal  2mg  at hs Remeron  15mg  at hs Valium  5mg  TID    RTC 3 months  25 minutes spent dedicated to the care of this patient on the date of this encounter to include pre-visit review of records, ordering of medication, post visit documentation, and face-to-face time with the patient discussing MDD, anxiety, panic attacks and insomnia. Discussed continuing current medication regimen.  Patient advised to contact office with any questions, adverse effects, or acute worsening in signs and symptoms.  Discussed potential metabolic side effects associated with atypical antipsychotics, as well as potential risk for movement side effects. Advised pt to contact office if movement side effects occur.   Discussed potential benefits,  risk, and side effects of benzodiazepines to include potential risk of tolerance and dependence, as well as possible drowsiness.  Advised patient not to drive if experiencing drowsiness and to take lowest possible effective dose to minimize risk of dependence and tolerance.   There are no diagnoses linked to this encounter.  Please see After Visit Summary for patient specific instructions.  Future Appointments  Date Time Provider Department Center  07/09/2024 11:00 AM [place] Holian Nattalie, NP [facility] None    No orders of the defined types were placed in this encounter.      -------------------------------

## 2024-07-16 DIAGNOSIS — E782 Mixed hyperlipidemia: Secondary | ICD-10-CM | POA: Diagnosis not present

## 2024-07-16 DIAGNOSIS — Z131 Encounter for screening for diabetes mellitus: Secondary | ICD-10-CM | POA: Diagnosis not present

## 2024-07-16 DIAGNOSIS — J449 Chronic obstructive pulmonary disease, unspecified: Secondary | ICD-10-CM | POA: Diagnosis not present

## 2024-07-16 DIAGNOSIS — Z0000 Encounter for general adult medical examination without abnormal findings: Secondary | ICD-10-CM | POA: Diagnosis not present

## 2024-07-19 ENCOUNTER — Other Ambulatory Visit: Payer: Self-pay | Admitting: Family Medicine

## 2024-07-19 DIAGNOSIS — N3281 Overactive bladder: Secondary | ICD-10-CM

## 2024-07-19 NOTE — Telephone Encounter (Signed)
 Pt will need to do a TOC for medication refills. Please call him and advise him of this. TY

## 2024-07-20 NOTE — Telephone Encounter (Signed)
 Called patient left vm to call back and do a TOC

## 2024-10-05 ENCOUNTER — Encounter: Payer: Self-pay | Admitting: Adult Health

## 2024-10-05 ENCOUNTER — Ambulatory Visit: Admitting: Adult Health

## 2024-10-05 DIAGNOSIS — F331 Major depressive disorder, recurrent, moderate: Secondary | ICD-10-CM

## 2024-10-05 DIAGNOSIS — F410 Panic disorder [episodic paroxysmal anxiety] without agoraphobia: Secondary | ICD-10-CM | POA: Diagnosis not present

## 2024-10-05 DIAGNOSIS — F411 Generalized anxiety disorder: Secondary | ICD-10-CM

## 2024-10-05 DIAGNOSIS — G4700 Insomnia, unspecified: Secondary | ICD-10-CM

## 2024-10-05 MED ORDER — DULOXETINE HCL 20 MG PO CPEP: 20.0000 mg | ORAL_CAPSULE | Freq: Two times a day (BID) | ORAL | 2 refills | Status: AC

## 2024-10-05 MED ORDER — MIRTAZAPINE 15 MG PO TABS: 15.0000 mg | ORAL_TABLET | Freq: Every day | ORAL | 2 refills | Status: AC

## 2024-10-05 MED ORDER — RISPERIDONE 2 MG PO TABS: 2.0000 mg | ORAL_TABLET | Freq: Every day | ORAL | 2 refills | Status: AC

## 2024-10-05 MED ORDER — DIAZEPAM 5 MG PO TABS: 5.0000 mg | ORAL_TABLET | Freq: Three times a day (TID) | ORAL | 2 refills | Status: AC | PRN

## 2024-10-05 NOTE — Progress Notes (Signed)
 Edward Cuevas [id number] Jun 11, 1956 68 y.o.  Subjective:   Patient ID:  Edward Cuevas is a 68 y.o. (DOB 04/07/1956) male.  Chief Complaint: No chief complaint on file.   HPI Edward Cuevas presents to the office today for follow-up of MDD, anxiety, panic attacks, and insomnia.  Describes mood today as ok. Pleasant. Mood symptoms - reports some anxiety, irritability and depression. Reports decreased interest and motivation. Denies recent panic attacks. Denies worry, rumination and over thinking. Reports he stopped taking Cymbalta  2 months ago and recently restarted it. Stating I didn't think it was doing anything, but it was. Reports mood is stable - getting there. Stating I feel like I'm doing ok. Feels like current medication regimen works well. Reports taking medications as prescribed.  Energy levels lower. Active, does not have a regular exercise routine with physical disabilities.  Enjoys some usual interests and activities. Divorced. Lives with ex wife. Has 2 grown sons. Has a sister local. Spending time with family. Appetite adequate. Weight loss - 150 pounds. Sleeps well most nights. Averages 8 to 9 hours. Denies daytime napping.  Reports focus and concentration difficulties - getting better. Completing tasks. Managing aspects of household. Receiving disability benefits/retirement. Retired animal nutritionist.  Denies SI or HI.  Denies AH or VH. Denies self harm. Denies substance use. Consumes 2 beers a day.  Previous medication trials: Cymbalta , Wellbutrin    AUDIT    Flowsheet Row Office Visit from 03/24/2021 in [facility] at [facility]  Alcohol Use Disorder Identification Test Final Score (AUDIT) 9   GAD-7    Flowsheet Row Office Visit from 07/14/2020 in [facility] at [facility] Office Visit from 01/09/2020 in [facility] Primary Care & [facility] Medicine at [facility] Office Visit from  11/28/2019 in [facility] at [facility] Office Visit from 07/11/2019 in [facility] Primary Care & Sports Medicine at [facility] Office Visit from 05/16/2019 in [facility] Sports Medicine at [facility]  Total GAD-7 Score 0 6 3 0 1   PHQ2-9    Flowsheet Row Office Visit from 10/25/2022 in [facility] Primary Care & [facility] Medicine at [facility] Office Visit from 10/13/2021 in [facility] [place]. [facility] Office Visit from 06/29/2021 in [facility] - [facility] [place]. [facility] Office Visit from 03/24/2021 in [facility] Primary Care [facility] Medicine at [facility] Office Visit from 07/14/2020 in [facility] Care & [facility] Medicine at [facility] Total Score 2 0 0 0 0  PHQ-9 Total Score 4 -- -- 0 4   Flowsheet Row ED from 06/26/2023 in [facility] at [facility]  C-SSRS RISK CATEGORY No Risk     Review of Systems:  Review of Systems  Musculoskeletal:  Negative for gait problem.  Neurological:  Negative for tremors.  Psychiatric/Behavioral:         Please refer to HPI    Medications: I have reviewed the patient's current medications.  Current Outpatient Medications  Medication Sig Dispense Refill   DULoxetine  (CYMBALTA ) 20 MG capsule Take 1 capsule (20 mg total) by mouth 2 (two) times daily. 60 capsule 2   acetaminophen  (TYLENOL ) 650 MG CR tablet Take 650 mg by mouth 2 (two) times daily.     aspirin  EC 81 MG tablet Take  1 tablet (81 mg total) by mouth daily. 90 tablet 3   cyclobenzaprine  (FLEXERIL ) 10 MG tablet Take 1 tablet (10 mg total) by mouth 3 (three) times daily as needed for muscle spasms. 30 tablet 0   diazepam  (VALIUM ) 5 MG tablet Take 1 tablet (5 mg total) by mouth 3 (three) times daily as needed for anxiety. 90 tablet 2   Fluticasone -Umeclidin-Vilant  (TRELEGY ELLIPTA ) 100-62.5-25 MCG/ACT AEPB Inhale 1 puff into the lungs daily. 180 each 1   hydrocortisone  (ANUSOL -HC) 2.5 % rectal cream APPLY RECTALLY TO THE AFFECTED AREA TWICE DAILY 60 g 1   imiquimod (ALDARA) 5 % cream Apply 1 Application topically at bedtime.     mirtazapine  (REMERON ) 15 MG tablet Take 1 tablet (15 mg total) by mouth at bedtime. 30 tablet 2   MYRBETRIQ  50 MG TB24 tablet Take 1 tablet (50 mg total) by mouth daily. 90 tablet 0   nitroGLYCERIN  (NITROSTAT ) 0.4 MG SL tablet Place 0.4 mg under the tongue every 5 (five) minutes as needed for chest pain.     risperiDONE  (RISPERDAL ) 2 MG tablet Take 1 tablet (2 mg total) by mouth at bedtime. 30 tablet 2   rosuvastatin  (CRESTOR ) 40 MG tablet TAKE 1 TABLET(40 MG) BY MOUTH DAILY 90 tablet 3   tamsulosin  (FLOMAX ) 0.4 MG CAPS capsule Take 1 capsule (0.4 mg total) by mouth daily. (Patient not taking: Reported on 04/03/2024) 90 capsule 3   No current facility-administered medications for this visit.    Medication Side Effects: None  Allergies: Allergies[1]  Past Medical History:  Diagnosis Date   Abdominal distension (gaseous) 04/11/2019   Actinic keratosis 06/27/2015   Adrenal adenoma 04/17/2019   Bilateral CT abd 03/2019    Anxiety    Aortic atherosclerosis 05/16/2019   Arthritis    BPH without obstruction/lower urinary tract symptoms 11/25/2015   Cancer (HCC)    skin   Carotid stenosis, right 04/17/2019   50-69 percent narrowed. Doppler 03/2019.    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   Coronary artery disease due to calcified coronary lesion 05/16/2019   Current smoker 04/09/2019   DDD (degenerative disc disease),  lumbosacral 10/23/2013   opiod risk 1, low risk.  Failed UDS 05/2017 positive for marijuanna/alcohol.    Depression    DJD 10/23/2013   Dyspnea on exertion 05/31/2019   Fatty liver 10/01/2016   Via U/S 09/2016.    GAD (generalized anxiety disorder) 11/01/2014   Generalized anxiety disorder 10/26/2022   Hepatitis    A,B,C   History of hepatitis C 10/08/2014   Per pt cured.   Hyperlipidemia    Hypertension    Insomnia 10/26/2022   Lumbar radiculopathy 01/18/2017   Mixed hyperlipidemia    Moderate episode of recurrent major depressive disorder (HCC) 05/16/2019   OAB (overactive bladder) 07/12/2019   Panic attacks 10/04/2014   Positive colorectal cancer screening using Cologuard test 03/10/2021   Pre-diabetes 10/26/2022   Prediabetes    Pulmonary nodules 05/16/2019   Right carotid bruit 04/09/2019   Right inguinal hernia 10/08/2014   Right lower quadrant abdominal pain 04/09/2019   Shakiness 07/01/2023   Vitamin D  deficiency     Past Medical History, Surgical history, Social history, and Family  history were reviewed and updated as appropriate.   Please see review of systems for further details on the patient's review from today.   Objective:   Physical Exam:  There were no vitals taken for this visit.  Physical Exam Constitutional:      General: He is not in acute distress. Musculoskeletal:        General: No deformity.  Neurological:     Mental Status: He is alert and oriented to person, place, and time.     Coordination: Coordination normal.  Psychiatric:        Attention and Perception: Attention and perception normal. He does not perceive auditory or visual hallucinations.        Mood and Affect: Mood normal. Mood is not anxious or depressed. Affect is not labile, blunt, angry or inappropriate.        Speech: Speech normal.        Behavior: Behavior normal.        Thought Content: Thought content normal. Thought content is not paranoid or delusional. Thought  content does not include homicidal or suicidal ideation. Thought content does not include homicidal or suicidal plan.        Cognition and Memory: Cognition and memory normal.        Judgment: Judgment normal.     Comments: Insight intact     Lab Review:     Component Value Date/Time   NA 136 06/26/2023 0945   NA 137 09/13/2022 1041   K 4.5 06/26/2023 0945   CL 98 06/26/2023 0945   CO2 31 06/26/2023 0945   GLUCOSE 103 (H) 06/26/2023 0945   BUN 8 06/26/2023 0945   BUN 9 09/13/2022 1041   CREATININE 0.89 06/26/2023 0945   CREATININE 0.90 04/27/2021 1355   CALCIUM  9.2 06/26/2023 0945   PROT 7.0 06/26/2023 0945   PROT 6.9 09/13/2022 1041   ALBUMIN 4.5 06/26/2023 0945   ALBUMIN 4.5 09/13/2022 1041   AST 21 06/26/2023 0945   ALT 14 06/26/2023 0945   ALT 77 (H) 03/09/2021 1421   ALKPHOS 36 (L) 06/26/2023 0945   BILITOT 0.5 06/26/2023 0945   BILITOT 0.4 09/13/2022 1041   GFRNONAA >60 06/26/2023 0945   GFRNONAA 83 02/25/2021 1340   GFRAA 96 02/25/2021 1340       Component Value Date/Time   WBC 5.8 06/26/2023 0945   RBC 6.11 (H) 06/26/2023 0945   HGB 19.1 (H) 06/26/2023 0945   HGB 18.0 (H) 09/13/2022 1041   HCT 55.1 (H) 06/26/2023 0945   HCT 52.3 (H) 09/13/2022 1041   PLT 152 06/26/2023 0945   PLT 177 09/13/2022 1041   MCV 90.2 06/26/2023 0945   MCV 90 09/13/2022 1041   MCH 31.3 06/26/2023 0945   MCHC 34.7 06/26/2023 0945   RDW 12.8 06/26/2023 0945   RDW 12.7 09/13/2022 1041   LYMPHSABS 2.2 06/26/2023 0945   MONOABS 0.5 06/26/2023 0945   EOSABS 0.0 06/26/2023 0945   BASOSABS 0.0 06/26/2023 0945    No results found for: POCLITH, LITHIUM   No results found for: PHENYTOIN, PHENOBARB, VALPROATE, CBMZ   .res Assessment: Plan:    Plan:  PDMP reviewed  Cymbalta  20mg  twice daily - restarted between visits Risperdal  2mg  at hs Remeron  15mg  at hs Valium  5mg  TID    RTC 3 months  25 minutes spent dedicated to the care of this patient on the date of  this encounter to include pre-visit review of records, ordering of medication, post visit documentation, and  face-to-face time with the patient discussing MDD, anxiety, panic attacks and insomnia. Discussed continuing current medication regimen.  Patient advised to contact office with any questions, adverse effects, or acute worsening in signs and symptoms.  Discussed potential metabolic side effects associated with atypical antipsychotics, as well as potential risk for movement side effects. Advised pt to contact office if movement side effects occur.   Discussed potential benefits, risk, and side effects of benzodiazepines to include potential risk of tolerance and dependence, as well as possible drowsiness.  Advised patient not to drive if experiencing drowsiness and to take lowest possible effective dose to minimize risk of dependence and tolerance.  Diagnoses and all orders for this visit:  Moderate episode of recurrent major depressive disorder (HCC)  Insomnia, unspecified type -     mirtazapine  (REMERON ) 15 MG tablet; Take 1 tablet (15 mg total) by mouth at bedtime. -     risperiDONE  (RISPERDAL ) 2 MG tablet; Take 1 tablet (2 mg total) by mouth at bedtime.  Generalized anxiety disorder -     diazepam  (VALIUM ) 5 MG tablet; Take 1 tablet (5 mg total) by mouth 3 (three) times daily as needed for anxiety.  Panic attacks  Other orders -     DULoxetine  (CYMBALTA ) 20 MG capsule; Take 1 capsule (20 mg total) by mouth 2 (two) times daily.     Please see After Visit Summary for patient specific instructions.  Future Appointments  Date Time Provider Department Center  01/03/2025  1:30 PM Del Overfelt Nattalie, NP [facility] None    No orders of the defined types were placed in this encounter.   -------------------------------     [1]  Allergies Allergen Reactions   Abilify  [Aripiprazole ]     Feel weird.    Penicillins Itching and Rash    Has patient had a PCN reaction causing  immediate rash, facial/tongue/throat swelling, SOB or lightheadedness with hypotension: Yes Has patient had a PCN reaction causing severe rash involving mucus membranes or skin necrosis: No Has patient had a PCN reaction that required hospitalization No Has patient had a PCN reaction occurring within the last 10 years: No If all of the above answers are NO, then may proceed with Cephalosporin use.

## 2024-11-13 ENCOUNTER — Other Ambulatory Visit: Payer: Self-pay | Admitting: Adult Health

## 2024-11-13 DIAGNOSIS — F411 Generalized anxiety disorder: Secondary | ICD-10-CM

## 2025-01-03 ENCOUNTER — Ambulatory Visit: Admitting: Adult Health
